# Patient Record
Sex: Female | Born: 1980 | Race: White | Hispanic: No | Smoking: Never smoker
Health system: Southern US, Community
[De-identification: ages and names within clinical notes are randomized; demographics above are authoritative.]

## PROBLEM LIST (undated history)

## (undated) DIAGNOSIS — F329 Major depressive disorder, single episode, unspecified: Secondary | ICD-10-CM

## (undated) DIAGNOSIS — K56609 Unspecified intestinal obstruction, unspecified as to partial versus complete obstruction: Secondary | ICD-10-CM

## (undated) DIAGNOSIS — F909 Attention-deficit hyperactivity disorder, unspecified type: Secondary | ICD-10-CM

## (undated) DIAGNOSIS — K449 Diaphragmatic hernia without obstruction or gangrene: Secondary | ICD-10-CM

## (undated) DIAGNOSIS — K219 Gastro-esophageal reflux disease without esophagitis: Secondary | ICD-10-CM

## (undated) DIAGNOSIS — K580 Irritable bowel syndrome with diarrhea: Secondary | ICD-10-CM

## (undated) DIAGNOSIS — N200 Calculus of kidney: Secondary | ICD-10-CM

## (undated) DIAGNOSIS — Z8719 Personal history of other diseases of the digestive system: Secondary | ICD-10-CM

## (undated) DIAGNOSIS — D649 Anemia, unspecified: Secondary | ICD-10-CM

## (undated) DIAGNOSIS — Z862 Personal history of diseases of the blood and blood-forming organs and certain disorders involving the immune mechanism: Secondary | ICD-10-CM

## (undated) DIAGNOSIS — K589 Irritable bowel syndrome without diarrhea: Secondary | ICD-10-CM

## (undated) DIAGNOSIS — G4730 Sleep apnea, unspecified: Secondary | ICD-10-CM

## (undated) DIAGNOSIS — F340 Cyclothymic disorder: Secondary | ICD-10-CM

## (undated) DIAGNOSIS — F5104 Psychophysiologic insomnia: Secondary | ICD-10-CM

## (undated) DIAGNOSIS — G4719 Other hypersomnia: Secondary | ICD-10-CM

## (undated) DIAGNOSIS — G44009 Cluster headache syndrome, unspecified, not intractable: Secondary | ICD-10-CM

## (undated) DIAGNOSIS — G43909 Migraine, unspecified, not intractable, without status migrainosus: Secondary | ICD-10-CM

## (undated) DIAGNOSIS — J42 Unspecified chronic bronchitis: Secondary | ICD-10-CM

## (undated) DIAGNOSIS — G4733 Obstructive sleep apnea (adult) (pediatric): Secondary | ICD-10-CM

## (undated) DIAGNOSIS — F419 Anxiety disorder, unspecified: Secondary | ICD-10-CM

## (undated) DIAGNOSIS — T8339XA Other mechanical complication of intrauterine contraceptive device, initial encounter: Secondary | ICD-10-CM

## (undated) DIAGNOSIS — F988 Other specified behavioral and emotional disorders with onset usually occurring in childhood and adolescence: Secondary | ICD-10-CM

## (undated) DIAGNOSIS — E282 Polycystic ovarian syndrome: Secondary | ICD-10-CM

## (undated) DIAGNOSIS — R05 Cough: Secondary | ICD-10-CM

## (undated) DIAGNOSIS — Z8711 Personal history of peptic ulcer disease: Secondary | ICD-10-CM

## (undated) DIAGNOSIS — F32A Depression, unspecified: Secondary | ICD-10-CM

## (undated) DIAGNOSIS — J45909 Unspecified asthma, uncomplicated: Secondary | ICD-10-CM

## (undated) HISTORY — PX: ESOPHAGOGASTRODUODENOSCOPY: SHX1529

## (undated) HISTORY — DX: Polycystic ovarian syndrome: E28.2

## (undated) HISTORY — DX: Anxiety disorder, unspecified: F41.9

## (undated) HISTORY — PX: TYMPANOSTOMY TUBE PLACEMENT: SHX32

## (undated) HISTORY — PX: UPPER GASTROINTESTINAL ENDOSCOPY: SHX188

---
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
## 2009-03-15 ENCOUNTER — Ambulatory Visit: Admission: RE | Admit: 2009-03-15 | Discharge: 2009-03-15 | Payer: Self-pay | Admitting: Internal Medicine

## 2009-07-17 ENCOUNTER — Other Ambulatory Visit: Admission: RE | Admit: 2009-07-17 | Discharge: 2009-07-17 | Payer: Self-pay | Admitting: Obstetrics and Gynecology

## 2010-03-06 ENCOUNTER — Emergency Department (HOSPITAL_BASED_OUTPATIENT_CLINIC_OR_DEPARTMENT_OTHER)
Admission: EM | Admit: 2010-03-06 | Discharge: 2010-03-06 | Disposition: A | Discharge status: Home / Self Care | Payer: Managed Care, Other (non HMO) | Source: Home / Self Care | Attending: Emergency Medicine | Admitting: Emergency Medicine

## 2010-03-06 ENCOUNTER — Emergency Department (HOSPITAL_BASED_OUTPATIENT_CLINIC_OR_DEPARTMENT_OTHER)
Admission: EM | Admit: 2010-03-06 | Discharge: 2010-03-06 | Disposition: A | Discharge status: Other Acute Inpatient Hospital | Payer: Managed Care, Other (non HMO) | Source: Home / Self Care | Attending: Emergency Medicine | Admitting: Emergency Medicine

## 2010-03-06 ENCOUNTER — Emergency Department (INDEPENDENT_AMBULATORY_CARE_PROVIDER_SITE_OTHER): Payer: Managed Care, Other (non HMO)

## 2010-03-06 ENCOUNTER — Inpatient Hospital Stay (HOSPITAL_COMMUNITY)
Admission: EM | Admit: 2010-03-06 | Discharge: 2010-03-09 | DRG: 389 | Disposition: A | Discharge status: Home / Self Care | Payer: Managed Care, Other (non HMO) | Attending: Surgery | Admitting: Surgery

## 2010-03-06 DIAGNOSIS — R112 Nausea with vomiting, unspecified: Secondary | ICD-10-CM | POA: Insufficient documentation

## 2010-03-06 DIAGNOSIS — D72829 Elevated white blood cell count, unspecified: Secondary | ICD-10-CM | POA: Diagnosis present

## 2010-03-06 DIAGNOSIS — R109 Unspecified abdominal pain: Secondary | ICD-10-CM | POA: Insufficient documentation

## 2010-03-06 DIAGNOSIS — K56609 Unspecified intestinal obstruction, unspecified as to partial versus complete obstruction: Secondary | ICD-10-CM

## 2010-03-06 DIAGNOSIS — J45909 Unspecified asthma, uncomplicated: Secondary | ICD-10-CM | POA: Diagnosis present

## 2010-03-06 DIAGNOSIS — K219 Gastro-esophageal reflux disease without esophagitis: Secondary | ICD-10-CM | POA: Diagnosis present

## 2010-03-06 DIAGNOSIS — Z6841 Body Mass Index (BMI) 40.0 and over, adult: Secondary | ICD-10-CM

## 2010-03-06 LAB — CBC
Hemoglobin: 11.2 g/dL — ABNORMAL LOW (ref 12.0–15.0)
MCH: 21.8 pg — ABNORMAL LOW (ref 26.0–34.0)
MCHC: 31.7 g/dL (ref 30.0–36.0)
MCV: 67.7 fL — ABNORMAL LOW (ref 78.0–100.0)
Platelets: 324 10*3/uL (ref 150–400)
Platelets: ADEQUATE 10*3/uL (ref 150–400)
RBC: 5.23 MIL/uL — ABNORMAL HIGH (ref 3.87–5.11)
RDW: 19.2 % — ABNORMAL HIGH (ref 11.5–15.5)
WBC: 12.7 10*3/uL — ABNORMAL HIGH (ref 4.0–10.5)

## 2010-03-06 LAB — URINALYSIS, ROUTINE W REFLEX MICROSCOPIC
Ketones, ur: NEGATIVE mg/dL
Ketones, ur: NEGATIVE mg/dL
Nitrite: NEGATIVE
Nitrite: NEGATIVE
Protein, ur: NEGATIVE mg/dL
Specific Gravity, Urine: 1.03 (ref 1.005–1.030)
Urine Glucose, Fasting: NEGATIVE mg/dL
Urine Glucose, Fasting: NEGATIVE mg/dL
Urobilinogen, UA: 0.2 mg/dL (ref 0.0–1.0)
pH: 5.5 (ref 5.0–8.0)

## 2010-03-06 LAB — COMPREHENSIVE METABOLIC PANEL
ALT: 34 U/L (ref 0–35)
AST: 25 U/L (ref 0–37)
Albumin: 4.9 g/dL (ref 3.5–5.2)
BUN: 18 mg/dL (ref 6–23)
CO2: 22 mEq/L (ref 19–32)
Calcium: 9.2 mg/dL (ref 8.4–10.5)
Chloride: 103 mEq/L (ref 96–112)
Creatinine, Ser: 0.8 mg/dL (ref 0.4–1.2)
Creatinine, Ser: 0.6 mg/dL (ref 0.4–1.2)
GFR calc Af Amer: >60 mL/min (ref >60)
GFR calc Af Amer: >60 mL/min (ref >60)
GFR calc non Af Amer: >60 mL/min (ref >60)
Glucose, Bld: 108 mg/dL — ABNORMAL HIGH (ref 70–99)
Sodium: 142 mEq/L (ref 135–145)
Total Bilirubin: 0.7 mg/dL (ref 0.3–1.2)
Total Protein: 8.8 g/dL — ABNORMAL HIGH (ref 6.0–8.3)

## 2010-03-06 LAB — WET PREP, GENITAL: Clue Cells Wet Prep HPF POC: NONE SEEN

## 2010-03-06 LAB — DIFFERENTIAL
Basophils Absolute: 0 10*3/uL (ref 0.0–0.1)
Basophils Absolute: 0 10*3/uL (ref 0.0–0.1)
Eosinophils Absolute: 0.1 10*3/uL (ref 0.0–0.7)
Eosinophils Absolute: 0.1 10*3/uL (ref 0.0–0.7)
Lymphocytes Relative: 20 % (ref 12–46)
Lymphocytes Relative: 11 % — ABNORMAL LOW (ref 12–46)
Lymphs Abs: 2.5 10*3/uL (ref 0.7–4.0)
Lymphs Abs: 1.3 10*3/uL (ref 0.7–4.0)
Monocytes Relative: 5 % (ref 3–12)
Monocytes Relative: 3 % (ref 3–12)
Neutro Abs: 9.5 10*3/uL — ABNORMAL HIGH (ref 1.7–7.7)

## 2010-03-06 LAB — MAGNESIUM: Magnesium: 2.1 mg/dL (ref 1.5–2.5)

## 2010-03-06 LAB — LIPASE, BLOOD
Lipase: 38 U/L (ref 23–300)
Lipase: 20 U/L (ref 11–59)

## 2010-03-06 LAB — PREGNANCY, URINE: Preg Test, Ur: NEGATIVE

## 2010-03-06 LAB — URINE MICROSCOPIC-ADD ON

## 2010-03-06 LAB — AMYLASE: Amylase: 32 U/L (ref 0–105)

## 2010-03-06 MED ORDER — IOHEXOL 300 MG/ML  SOLN
100.0000 mL | Freq: Once | INTRAMUSCULAR | Status: AC | PRN
  Administered 2010-03-06: 100 mL via INTRAVENOUS

## 2010-03-07 ENCOUNTER — Inpatient Hospital Stay (HOSPITAL_COMMUNITY): Payer: Managed Care, Other (non HMO)

## 2010-03-07 LAB — CBC
MCH: 21.8 pg — ABNORMAL LOW (ref 26.0–34.0)
MCHC: 30.6 g/dL (ref 30.0–36.0)
MCV: 71.1 fL — ABNORMAL LOW (ref 78.0–100.0)
Platelets: 242 10*3/uL (ref 150–400)
RBC: 4.64 MIL/uL (ref 3.87–5.11)

## 2010-03-07 LAB — DIFFERENTIAL
Basophils Relative: 0 % (ref 0–1)
Eosinophils Absolute: 0.1 10*3/uL (ref 0.0–0.7)
Eosinophils Relative: 1 % (ref 0–5)
Lymphs Abs: 2.1 10*3/uL (ref 0.7–4.0)
Monocytes Absolute: 0.4 10*3/uL (ref 0.1–1.0)
Neutro Abs: 5.2 10*3/uL (ref 1.7–7.7)

## 2010-03-07 LAB — BASIC METABOLIC PANEL
BUN: 6 mg/dL (ref 6–23)
CO2: 25 mEq/L (ref 19–32)
Calcium: 8.3 mg/dL — ABNORMAL LOW (ref 8.4–10.5)
Chloride: 106 mEq/L (ref 96–112)
Creatinine, Ser: 0.62 mg/dL (ref 0.4–1.2)
GFR calc Af Amer: >60 mL/min (ref >60)

## 2010-03-07 LAB — GC/CHLAMYDIA PROBE AMP, GENITAL
Chlamydia, DNA Probe: NEGATIVE
GC Probe Amp, Genital: NEGATIVE

## 2010-03-07 LAB — TSH: TSH: 3.654 u[IU]/mL (ref 0.350–4.500)

## 2010-03-08 ENCOUNTER — Inpatient Hospital Stay (HOSPITAL_COMMUNITY): Payer: Managed Care, Other (non HMO)

## 2010-03-09 ENCOUNTER — Inpatient Hospital Stay (HOSPITAL_COMMUNITY): Payer: Managed Care, Other (non HMO)

## 2010-03-09 LAB — CBC
HCT: 32.9 % — ABNORMAL LOW (ref 36.0–46.0)
MCH: 21.4 pg — ABNORMAL LOW (ref 26.0–34.0)
MCV: 70.3 fL — ABNORMAL LOW (ref 78.0–100.0)
Platelets: 256 10*3/uL (ref 150–400)
RBC: 4.68 MIL/uL (ref 3.87–5.11)

## 2010-03-09 LAB — BASIC METABOLIC PANEL
BUN: 5 mg/dL — ABNORMAL LOW (ref 6–23)
Chloride: 104 mEq/L (ref 96–112)
Creatinine, Ser: 0.7 mg/dL (ref 0.4–1.2)
Glucose, Bld: 84 mg/dL (ref 70–99)

## 2010-03-11 NOTE — H&P (Signed)
NAMESHELBIE, FRANKEN          ACCOUNT NO.:  [id number]  MEDICAL RECORD NO.:  [id number]           PATIENT TYPE:  E  LOCATION:  [facility]                         FACILITY:  [facility]  PHYSICIAN:  Ardeth Sportsman, MD     DATE OF BIRTH:  May 07, 1980  DATE OF ADMISSION:  03/06/2010 DATE OF DISCHARGE:                             HISTORY & PHYSICAL   REFERRING PHYSICIAN:  Dr. Randa Evens at [facility] ER.  PRIMARY CARE PHYSICIAN:  Dr. Deirdre Peer. Polite, MD.  NURSE PRACTITIONER:  Lorenda Cahill.  CHIEF COMPLAINT:  Abdominal pain, nausea, vomiting.  BRIEF HISTORY:  The patient is a 30 year old female, who presented early this morning at [facility] ER with 5 hours of nausea and vomiting, abdominal discomfort, which started as cramps.  She initially thought she was hungry and ate some more an hour after her first meal.  This did not make her feel better, became progressively worse.  She was seen and diagnosed with flu-like symptoms and sent home on Phenergan and Zofran. She was also given pain medications, which she cannot recall.  After going home and taking the medicine, she had ongoing nausea, vomiting, along with abdominal pain, which was in the right upper quadrant, both the right and left lower quadrants.  She returned to the ER around 11 o'clock today, was treated with morphine and Zofran on multiple occasions.  Workup included a pelvic, which was reported normal.  Wet prep was negative.  Urine micro was negative.  White count was 11.9, hemoglobin 11.2, platelets 35,000.  Sodium is 142, potassium is 4.6, chloride is 109, CO2 is 22, BUN is 13, creatinine is 0.6, glucose is 108.  Lipase is 38, total bilirubin 0.7, alk phos 112, SGPT 23, SGOT 34. Pregnancy was negative.  Workup earlier at 1:30 in the a.m. showed a white count of 12.7, hemoglobin of 11, hematocrit 35, platelets 324,000. The patient then underwent CT of the abdomen, small-bowel dilatation with fluid-filled mid abdominal  small-bowel loops noted.  The proximal and distal small bowel was decompressed.  There was concern with loop obstruction.  The patient was referred to [facility] for evaluation and admission.  The patient continues to have abdominal discomfort and her nausea has now resolved.  She has an NG in place. She has no history of abdominal pain prior to this.  PAST MEDICAL HISTORY: 1. Sinusitis, on antibiotics last week.  She had a urinary tract     infection about 5 years ago. 2. History of asthma. 3. History of depression, since her pregnancy. 4. GERD. 5. BMI of 55.8.  Height is 64 inches, weight is 325 pounds.  PAST SURGICAL HISTORY:  She had a C-section 2 years ago during that her first only pregnancy.  She had hyperemesis and 2 episodes of nephrolithiasis requiring hospitalization and treatment.  FAMILY HISTORY:  Both parents are living in good health.  Her mom has had a bypass.  She has one brother 3 in good health.  She has 3 sisters all living in good health.  They have some problems with depression, ADHD, and asthma.  SOCIAL HISTORY:  The patient is married,  lives with her husband.  She works at [employer].  Tobacco:  Never.  Alcohol:  Rare. Drugs:  None.  REVIEW OF SYSTEMS:  Fever:  None.  Skin changes:  None.  Weight:  No changes.  Psych:  No new changes.  Cerebrovascular:  No headaches, dizziness, syncope, presyncope, stroke, or seizure.  Pulmonary: Positive for being tired and lethargic all day.  No orthopnea or PND. She does have dyspnea on exertion.  She wakes up with reflux positive for asthma, positive for cough, which is clear now, but green last week before she started antibiotics.  Cardiac:  No history of chest pain or palpitations.  GI:  Positive for GERD.  Positive for nausea and vomiting, starting yesterday, but none prior to that.  No diarrhea or constipation.  No blood in her stool.  GU is negative.  Her last urinary tract infection  was approximately 5 years ago.  Lower Extremities:  No edema.  No claudication.  Musculoskeletal:  No arthritis.  No joint problems.  CURRENT MEDICATIONS: 1. Zoloft 200 mg daily. 2. Albuterol p.r.n. and 2 other medicines p.r.n. She does not know     what they are.  ALLERGIES:  None known.  PHYSICAL EXAM:  GENERAL:  This is a well-nourished, well-developed, markedly overweight, white female, in mild discomfort. VITAL SIGNS:  Her temperature at 11 a.m. was 97, heart rate was 63, blood pressure is 110/71.  She complained of 5/10 pain.  Currently, temperature is 98.2, heart rate is 69, blood pressure 107/63, respiratory rate is 20, sats are 94% on room air. HEAD:  Normocephalic. EARS:  Normal hearing. NOSE, THROAT, AND MOUTH:  Normal mucosa.  Dentition appears normal. Trachea is midline.  Thyroid is nonpalpable.  There is no JVD.  No palpable thyromegaly.  No bruits. RESPIRATORY:  Effort normal. CHEST:  Clear to auscultation and percussion.  Nontender to palpation. CARDIAC:  Normal S1 and S2.  No murmurs or rubs were heard.  Pulses are +2 and equal bilaterally. ABDOMEN:  She is not distended, but she is tender to palpation.  Bowel sounds are hypoactive.  Tenderness appears to be in the right upper quadrant, right lower and left lower quadrants. GU/RECTAL:  Deferred. LYMPHADENOPATHY:  No palpable lymphadenopathy in neck, groins. MUSCULOSKELETAL:  No abnormalities noted. SKIN:  No changes. NEUROLOGIC:  Normal affect.  Cranial nerves are intact. PSYCH:  Normal affect.  IMPRESSION: 1. Small-bowel obstruction. 2. Recent sinusitis, on antibiotics. 3. Asthma. 4. Depression. 5. BMI of 55.8. 6. Gastroesophageal reflux disease. 7. Leukocytosis.  PLAN:  We will hydrate her, place her on bowel rest, keep her n.p.o. with further treatment and evaluation as needed.     Eber Hong, P.A.   ______________________________ Ardeth Sportsman, MD    WDJ/MEDQ  D:  03/06/2010   T:  03/06/2010  Job:  [id number]  cc:   Deirdre Peer. Polite, M.D.  Electronically Signed by Sherrie George P.A. on 03/07/2010 06:24:38 PM Electronically Signed by Karie Soda MD on 03/11/2010 08:47:54 AM

## 2010-03-18 NOTE — Discharge Summary (Signed)
[name], Claudia Walsh          ACCOUNT NO.:  [id number]  MEDICAL RECORD NO.:  [id number]           PATIENT TYPE:  I  LOCATION:  5153                         FACILITY:  [facility]  PHYSICIAN:  Currie Paris, M.D.DATE OF BIRTH:  04/21/1980  DATE OF ADMISSION:  03/06/2010 DATE OF DISCHARGE:  03/09/2010                              DISCHARGE SUMMARY   ADMITTING PHYSICIAN:  Ardeth Sportsman, MD  PROCEDURES:  None.  CONSULTANTS:  None.  PRIMARY CARE PHYSICIAN:  Deirdre Peer. Polite, MD.  REASON FOR ADMISSION:  Claudia Walsh is a 30 year old female, who presented to the emergency department initially early on the morning of admission with a history of 5 hours of nausea, vomiting, and abdominal discomfort.  She was diagnosed with flu-like symptoms and sent home with Phenergan and Zofran for nausea.  After taking the medicines, they did not seem to help and she had persistent nausea, vomiting, and abdominal pain.  She returned to the emergency department where her further workup was completed.  Her white blood cell count was 11,900, and a CT scan was performed, which revealed small bowel dilatation with fluid filled mid abdominal small bowel loops consistent with obstruction.  Please see admitting history and physical for further diagnoses.  ADMITTING DIAGNOSES: 1. Small bowel obstruction. 2. Asthma. 3. History of depression. 4. Morbid obesity.  HOSPITAL COURSE:  At this time, the patient was admitted.  She was made n.p.o. and an NG tube was placed.  IV fluids were started.  The following day, the patient had abdominal films, which actually showed a resolution of a bowel obstruction.  However, the patient was not passing flatus, and had not had the bowel movement.  She was still requiring IV pain medicine for her abdominal pain.  At this time, the patient's NG tube was left in place.  On hospital day #2, the patient's abdomen was feeling much better.  She was passing flatus, but  had not had a bowel movement.  Her NG tube was clamped for 6 hours and with minimal residual was later discontinued.  She was placed on clear liquids at this time. She was also given a suppository, which then induced several bowel movements.  On hospital day #3, the patient was tolerating clear liquids without any complaints.  She was having bowel movements and flatus without nausea or vomiting or abdominal pain.  Her abdomen was soft, nontender, nondistended with active bowel sounds and otherwise morbidly obese.  At this time, her diet was advanced to a regular diet and as tolerated will be able to be discharged home.  DISCHARGE DIAGNOSES: 1. Small bowel obstruction, resolved. 2. Morbid obesity. 3. Asthma. 4. Depression.  DISCHARGE MEDICATIONS:  Please see medication reconciliation form.  DISCHARGE INSTRUCTIONS:  The patient has no activity restrictions or dietary restrictions.  She is informed to follow up with her primary care physician as needed.     Letha Cape, PA   ______________________________ Currie Paris, M.D.    KEO/MEDQ  D:  03/09/2010  T:  03/09/2010  Job:  [id number]  cc:   Deirdre Peer. Polite, M.D.  Electronically Signed by Barnetta Chapel PA on 03/15/2010  07:07:54 AM Electronically Signed by Cyndia Bent M.D. on 03/18/2010 09:06:17 AM

## 2010-05-22 ENCOUNTER — Inpatient Hospital Stay (HOSPITAL_COMMUNITY)
Admission: AD | Admit: 2010-05-22 | Discharge: 2010-05-27 | DRG: 885 | Disposition: A | Discharge status: Home / Self Care | Payer: Managed Care, Other (non HMO) | Source: Ambulatory Visit | Attending: Psychiatry | Admitting: Psychiatry

## 2010-05-22 ENCOUNTER — Emergency Department (HOSPITAL_COMMUNITY)
Admission: EM | Admit: 2010-05-22 | Discharge: 2010-05-22 | Disposition: A | Discharge status: Other Acute Inpatient Hospital | Payer: Managed Care, Other (non HMO) | Attending: Emergency Medicine | Admitting: Emergency Medicine

## 2010-05-22 DIAGNOSIS — N390 Urinary tract infection, site not specified: Secondary | ICD-10-CM

## 2010-05-22 DIAGNOSIS — Z79899 Other long term (current) drug therapy: Secondary | ICD-10-CM | POA: Insufficient documentation

## 2010-05-22 DIAGNOSIS — R51 Headache: Secondary | ICD-10-CM | POA: Insufficient documentation

## 2010-05-22 DIAGNOSIS — F329 Major depressive disorder, single episode, unspecified: Secondary | ICD-10-CM | POA: Insufficient documentation

## 2010-05-22 DIAGNOSIS — F411 Generalized anxiety disorder: Secondary | ICD-10-CM

## 2010-05-22 DIAGNOSIS — Z818 Family history of other mental and behavioral disorders: Secondary | ICD-10-CM

## 2010-05-22 DIAGNOSIS — R45851 Suicidal ideations: Secondary | ICD-10-CM

## 2010-05-22 DIAGNOSIS — J45909 Unspecified asthma, uncomplicated: Secondary | ICD-10-CM

## 2010-05-22 DIAGNOSIS — F3289 Other specified depressive episodes: Secondary | ICD-10-CM | POA: Insufficient documentation

## 2010-05-22 DIAGNOSIS — F332 Major depressive disorder, recurrent severe without psychotic features: Principal | ICD-10-CM

## 2010-05-22 LAB — DIFFERENTIAL
Basophils Relative: 0 % (ref 0–1)
Eosinophils Absolute: 0.1 10*3/uL (ref 0.0–0.7)
Eosinophils Relative: 1 % (ref 0–5)
Lymphs Abs: 2.4 10*3/uL (ref 0.7–4.0)
Monocytes Absolute: 0.5 10*3/uL (ref 0.1–1.0)
Monocytes Relative: 5 % (ref 3–12)
Neutrophils Relative %: 73 % (ref 43–77)

## 2010-05-22 LAB — BASIC METABOLIC PANEL
BUN: 12 mg/dL (ref 6–23)
CO2: 28 mEq/L (ref 19–32)
Calcium: 9.8 mg/dL (ref 8.4–10.5)
GFR calc non Af Amer: >60 mL/min (ref >60)
Glucose, Bld: 87 mg/dL (ref 70–99)

## 2010-05-22 LAB — URINE MICROSCOPIC-ADD ON

## 2010-05-22 LAB — URINALYSIS, ROUTINE W REFLEX MICROSCOPIC
Bilirubin Urine: NEGATIVE
Ketones, ur: NEGATIVE mg/dL
Nitrite: NEGATIVE
Specific Gravity, Urine: 1.022 (ref 1.005–1.030)
Urobilinogen, UA: 0.2 mg/dL (ref 0.0–1.0)

## 2010-05-22 LAB — POCT PREGNANCY, URINE: Preg Test, Ur: NEGATIVE

## 2010-05-22 LAB — CBC
MCH: 22.5 pg — ABNORMAL LOW (ref 26.0–34.0)
MCHC: 31.6 g/dL (ref 30.0–36.0)
MCV: 71.3 fL — ABNORMAL LOW (ref 78.0–100.0)
Platelets: 355 10*3/uL (ref 150–400)
RDW: 18.4 % — ABNORMAL HIGH (ref 11.5–15.5)

## 2010-05-22 LAB — RAPID URINE DRUG SCREEN, HOSP PERFORMED
Amphetamines: NOT DETECTED
Barbiturates: NOT DETECTED
Benzodiazepines: NOT DETECTED
Opiates: NOT DETECTED

## 2010-05-23 DIAGNOSIS — F332 Major depressive disorder, recurrent severe without psychotic features: Secondary | ICD-10-CM

## 2010-05-24 LAB — T4, FREE: Free T4: 1.02 ng/dL (ref 0.80–1.80)

## 2010-05-24 LAB — T3, FREE: T3, Free: 2.8 pg/mL (ref 2.3–4.2)

## 2010-05-24 NOTE — H&P (Signed)
Claudia Walsh, Claudia Walsh NO.:  [id number]  MEDICAL RECORD NO.:  [id number]           PATIENT TYPE:  I  LOCATION:  0508                          [facility]ACILITY:  [facility]  PHYSICIAN:  Franchot Gallo, MD     DATE OF BIRTH:  1980/05/17  DATE OF ADMISSION:  05/22/2010 DATE OF DISCHARGE:                      PSYCHIATRIC ADMISSION ASSESSMENT   CHIEF COMPLAINT:  "I began to have problems when my doctor switched me to Cymbalta."  HISTORY OF PRESENT ILLNESS:  Claudia Walsh is a 30 year old married white female who presents to Behavioral Health for evaluation of increasing depression and suicidal thoughts with various plans.  The patient states that she has a long history of depression, but was doing reasonably well until approximately 2 weeks ago when her mental health provider switched her antidepressant from Zoloft to Cymbalta.  Since that time, the patient states that her depressive symptoms have increasingly worsened with "racing thoughts and conversations in my head involving a female voice."  The patient also states that she began to experience suicidal ideation with several possible plans, but with no current intent.  Currently, the patient states that she is having significant difficulty initiating and maintaining sleep.  She reports a decreased appetite as well as severe feelings of sadness, anhedonia and depressed mood.  As stated above, the patient reports suicidal ideations, but states that she can contract for safety while in the hospital.  She reports one past suicide gesture of "holding a gun to my head" at the age of 87.  The patient also reports anxiety symptoms throughout the day.  She denies any panic episodes.  Other than the above mentioned "female voice," the patient denies any history of auditory or visual hallucinations, or delusional thinking.  She also denies any history of prolonged manic or hypomanic symptoms.  The patient denies any past or current  abuse of tobacco products, alcohol or illicit drugs.  She presents for evaluation and treatment of the above symptoms.  PAST PSYCHIATRIC HISTORY:  The patient denies any past psychiatric hospitalizations.  She does report past trials of  antidepressants Wellbutrin, Paxil and Zoloft, but did not find them to be of benefit.  CURRENT MEDICATIONS: 1. Cymbalta 60 mg p.o. q.a.m. prior to admission. 2. Xanax 0.5 to 1 mg p.r.n. q.8 h. p.r.n. for anxiety.  ALLERGIES:  NKDA.  PAST SURGICAL HISTORY:  Cesarean section approximately 2 years ago.  FAMILY HISTORY:  The patient states that her mother is depressed and she describes a maternal grandmother as having a bipolar illness.  She denies any other family history of psychiatric or substance abuse related illnesses.  SOCIAL HISTORY:  The patient states that she was born in [place] and has lived in [place] for the past 11 years.  She is married and currently lives with her husband, 71-year-old daughter and her in-laws. This is her first marriage and she has been married for 3-1/2 years. She has one child, a daughter 1 years of age who is in good health.  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  As stated in the HPI, she denies any use of tobacco products, alcohol or illicit drugs.  MENTAL STATUS EXAM:  GENERAL:  The patient was alert and oriented x3. She was cooperative throughout the evaluation, but moderately anxious. Speech was appropriate in terms of rate and volume.  Mood appeared moderate to severely depressed.  Affect was moderately constricted and anxious.  Thoughts, the patient reported suicidal ideations with plans prior to admission and was able to contract for safety now that she is in the hospital.  She denies any homicidal ideations.  She denies any obvious auditory or visual  hallucinations or delusional thinking, but describes "racing thoughts."  She also describes "conversations going on in my head  involving a female voice."  Judgment and insight both appeared fair.  IMPRESSION:     AXIS I:   Major depressive disorder, recurrent, moderate to severe.      Generalized anxiety disorder, moderate.  AXIS II:   Deferred.  AXIS III:   Asthma.  History of cesarean section 2 years ago.  AXIS IV:   Recent interaction with antidepressants.  Long history of    depressive symptoms.  Issues with Primary Support System.  AXIS V:   Global assessment of functioning at time of admission    approximately 35.  Highest global assessment of functioning in the past    year approximately 65.  PLAN: 1. The patient was started on the medication venlafaxine XR at 75 mg     p.o. q.a.m. to address her depressive symptoms. 2. The patient was not restarted on the medication Cymbalta. 3. The patient was started on the medication Xanax at 0.5 mg p.o. q.8     h. p.r.n. as needed for anxiety. 4. The patient was started on the medication trazodone 50 mg p.o.     nightly to address her sleep difficulties. 5. The patient was started on Cipro 250 mg p.o. b.i.d. x3 days to     address possible urinary tract infection. 6. The patient will continue to be monitored for dangerous to self     and/or others. 7. The patient will participate in unit activities per routine.    ___________________________________ Franchot Gallo, MD     RR/MEDQ  D:  05/23/2010  T:  05/24/2010  Job:  [id number]  Electronically Signed by Franchot Gallo MD on 05/24/2010 04:36:21 PM

## 2010-05-28 NOTE — Discharge Summary (Signed)
NAMEKELSHA, OLDER          ACCOUNT NO.:  [id number]  MEDICAL RECORD NO.:  [id number]           PATIENT TYPE:  I  LOCATION:  3086                          [facility]ACILITY:  [facility]  PHYSICIAN:  Franchot Gallo, MD     DATE OF BIRTH:  28-Dec-1980  DATE OF ADMISSION:  05/22/2010 DATE OF DISCHARGE:  05/27/2010                              DISCHARGE SUMMARY   REASON FOR ADMISSION:  The patient was having problems when she was switched to Cymbalta.  She was having increasing depression and suicidal thoughts with various plans.  She was reporting problems with her appetite, severe feelings of sadness, anhedonia and depressed mood.  FINAL IMPRESSION:  Axis I:  Major depressive disorder, recurrent, moderate to severe.  Generalized anxiety disorder, moderate. Axis II:  Deferred. Axis III:  History of asthma, history of cesarean section 2 years ago. Axis IV:  Recent interaction on antidepressants, a long history of depressive symptoms. Axis V:  Global Assessment of Functioning 55-60 with highest global of this past year being 70.  SIGNIFICANT LABS:  Urine drug screen is negative.  White count mildly elevated at 11.5.  Alcohol level less than 11.  BMET within normal limits.  Urine pregnancy test is negative.  SIGNIFICANT FINDINGS:  This is a 30 year old female admitted to the adult milieu.  She was alert, oriented x3, cooperative, but moderately anxious.  Her speech was appropriate in terms of rate and volume.  Mood appeared moderately to severely depressed.  She was able to contract for safety when she was in hospital and denied any homicidal ideation.  We started the patient on Effexor XR 75 mg to address her depressive symptoms.  We discontinued her Cymbalta and the patient was started on Xanax every 8 hours 0.5 mg as needed for anxiety, trazodone for sleep and Cipro b.i.d. for 3 days to address a possible urinary tract infection.  We continued to monitor her mood and affect.  The  patient was attending groups, endorsing symptoms of depression and suicidal thoughts.  She felt her medications were making her angry, reporting that she became suicidal on Cymbalta and then was switched to Effexor. She had some symptoms of acid reflux and was started on omeprazole, which was beneficial.  The patient was improving, she felt ready for discharge.  We contacted the patient's husband for a family session to gather collateral information, to assess safety concerns and provide suicide prevention.  Her sleep was improving.  Her appetite was good.  She was having moderate depressive symptoms.  She denied any psychotic symptoms. We added Neurontin routinely for her symptoms of anxiety.  On the day of discharge, the patient reported having good sleep, good appetite, her depressive symptoms were good.  She denied any suicidal or homicidal ideation and denied any auditory visualizations or delusional thinking. Her anxiety was under good control.  She had a family meeting this weekend that went well and we felt the patient was stable for discharge.  DISCHARGE MEDICATIONS: 1. Xanax 0.5 mg q.8 h., p.r.n. for anxiety. 2. Gabapentin 300 mg 1 t.i.d. 3. Trazodone 50 mg 1 nightly. 4. Effexor 150 mg daily. 5. The patient was  to stop taking her Cymbalta.  FOLLOW-UP APPOINTMENT:  Gweneth Dimitri at [facility] on May 29, 2010, at 8 a.m., phone number [phone], and Clovis Riley of [facility] on Thursday, May 30, 2010, at 1:15, phone number [phone].     Landry Corporal, N.P.   ______________________________ Franchot Gallo, MD    JO/MEDQ  D:  05/27/2010  T:  05/28/2010  Job:  [id number]  Electronically Signed by Limmie PatriciaP. on 05/28/2010 09:27:54 AM Electronically Signed by Franchot Gallo MD on 05/28/2010 04:23:47 PM

## 2010-08-01 ENCOUNTER — Encounter: Payer: Self-pay | Admitting: *Deleted

## 2010-08-01 ENCOUNTER — Emergency Department (HOSPITAL_BASED_OUTPATIENT_CLINIC_OR_DEPARTMENT_OTHER)
Admission: EM | Admit: 2010-08-01 | Discharge: 2010-08-01 | Discharge status: Against Medical Advice | Payer: Managed Care, Other (non HMO) | Attending: Emergency Medicine | Admitting: Emergency Medicine

## 2010-08-01 ENCOUNTER — Emergency Department (INDEPENDENT_AMBULATORY_CARE_PROVIDER_SITE_OTHER): Payer: Managed Care, Other (non HMO)

## 2010-08-01 ENCOUNTER — Emergency Department (HOSPITAL_BASED_OUTPATIENT_CLINIC_OR_DEPARTMENT_OTHER): Payer: Managed Care, Other (non HMO)

## 2010-08-01 DIAGNOSIS — R9409 Abnormal results of other function studies of central nervous system: Secondary | ICD-10-CM

## 2010-08-01 DIAGNOSIS — R4789 Other speech disturbances: Secondary | ICD-10-CM | POA: Insufficient documentation

## 2010-08-01 DIAGNOSIS — R479 Unspecified speech disturbances: Secondary | ICD-10-CM

## 2010-08-01 DIAGNOSIS — F411 Generalized anxiety disorder: Secondary | ICD-10-CM | POA: Insufficient documentation

## 2010-08-01 HISTORY — DX: Depression, unspecified: F32.A

## 2010-08-01 HISTORY — DX: Major depressive disorder, single episode, unspecified: F32.9

## 2010-08-01 LAB — DIFFERENTIAL
Basophils Absolute: 0 10*3/uL (ref 0.0–0.1)
Basophils Relative: 0 % (ref 0–1)
Eosinophils Relative: 1 % (ref 0–5)
Monocytes Absolute: 0.4 10*3/uL (ref 0.1–1.0)
Monocytes Relative: 6 % (ref 3–12)

## 2010-08-01 LAB — COMPREHENSIVE METABOLIC PANEL
AST: 18 U/L (ref 0–37)
Albumin: 4.1 g/dL (ref 3.5–5.2)
BUN: 14 mg/dL (ref 6–23)
Calcium: 9.4 mg/dL (ref 8.4–10.5)
Chloride: 101 mEq/L (ref 96–112)
Creatinine, Ser: 0.7 mg/dL (ref 0.50–1.10)
GFR calc non Af Amer: >60 mL/min (ref >60)
Total Bilirubin: 0.2 mg/dL — ABNORMAL LOW (ref 0.3–1.2)

## 2010-08-01 LAB — RAPID URINE DRUG SCREEN, HOSP PERFORMED
Barbiturates: NOT DETECTED
Cocaine: NOT DETECTED
Opiates: NOT DETECTED
Tetrahydrocannabinol: NOT DETECTED

## 2010-08-01 LAB — CK TOTAL AND CKMB (NOT AT ARMC)
CK, MB: 1.2 ng/mL (ref 0.3–4.0)
Relative Index: INVALID (ref 0.0–2.5)
Total CK: 63 U/L (ref 7–177)

## 2010-08-01 LAB — PROTIME-INR: INR: 1.05 (ref 0.00–1.49)

## 2010-08-01 LAB — APTT: aPTT: 37 seconds (ref 24–37)

## 2010-08-01 LAB — CBC
HCT: 35.9 % — ABNORMAL LOW (ref 36.0–46.0)
Hemoglobin: 11.5 g/dL — ABNORMAL LOW (ref 12.0–15.0)
MCH: 23.4 pg — ABNORMAL LOW (ref 26.0–34.0)
MCHC: 32 g/dL (ref 30.0–36.0)
MCV: 73.1 fL — ABNORMAL LOW (ref 78.0–100.0)
RDW: 18 % — ABNORMAL HIGH (ref 11.5–15.5)

## 2010-08-01 NOTE — ED Notes (Signed)
Pt reports at work and had sudden onset of stuttering speech. Pt denies pain. States unable to walk now since arrival to ED. Grips equal bilaterally, no arm drift, no facial droop.

## 2010-08-01 NOTE — ED Notes (Signed)
Notified dr. Fredricka Bonine that the patient wishes to be d/c home at this time instead of admission

## 2010-08-01 NOTE — ED Provider Notes (Signed)
History     Chief Complaint  Patient presents with  . Allergic Reaction   Patient is a 30 y.o. female presenting with neurologic complaint. The history is provided by the patient.  Neurologic Problem The primary symptoms include speech change. Primary symptoms do not include headaches, syncope, loss of consciousness, altered mental status, seizures, dizziness, visual change, paresthesias, focal weakness, loss of sensation, memory loss, fever, nausea or vomiting. Episode onset: four hours ago. The episode lasted 4 hours. The symptoms are unchanged.  Additional symptoms include anxiety. Additional symptoms do not include neck stiffness, weakness, pain, lower back pain, leg pain, loss of balance, photophobia, aura, hallucinations, nystagmus, taste disturbance, hyperacusis, hearing loss, tinnitus, vertigo, irritability or dysphoric mood. Medical issues do not include seizures, cerebral vascular accident or drug use. Associated medical issues comments: depression, anxiety. Workup history does not include MRI or CT scan.   the patient presents with acute onset of stuttering of speech that occurred while she was at work today, 4 hours ago. The problem has persisted subsequently and the patient is still having difficulty talking due to stuttering speech. She reports a similar phenomenon occurred several months ago after running out of Wellbutrin. The patient denies running out of any of her medications at this time, however reports that she is concerned several knee medications recently. She reports no other neurologic deficits at this time and denies headache or change in vision.  Past Medical History  Diagnosis Date  . Depression     History reviewed. No pertinent past surgical history.  History reviewed. No pertinent family history.  History  Substance Use Topics  . Smoking status: Never Smoker   . Smokeless tobacco: Not on file  . Alcohol Use: No    OB History    Grav Para Term Preterm  Abortions TAB SAB Ect Mult Living                  Review of Systems  Constitutional: Negative for fever and irritability.  HENT: Negative for hearing loss, neck pain, neck stiffness and tinnitus.   Eyes: Negative.  Negative for photophobia.  Respiratory: Negative.   Cardiovascular: Negative.  Negative for syncope.  Gastrointestinal: Negative.  Negative for nausea and vomiting.  Genitourinary: Negative.   Musculoskeletal: Negative.   Skin: Negative.   Neurological: Positive for tremors, speech change and speech difficulty. Negative for dizziness, vertigo, focal weakness, seizures, loss of consciousness, syncope, facial asymmetry, weakness, light-headedness, numbness, headaches, paresthesias and loss of balance.  Psychiatric/Behavioral: Negative for hallucinations, memory loss, confusion, dysphoric mood, agitation and altered mental status.    Physical Exam  BP 110/39  Pulse 76  Temp(Src) 98.4 F (36.9 C) (Oral)  Resp 16  SpO2 97%  Physical Exam  Constitutional: She is oriented to person, place, and time. She appears well-developed and well-nourished.  HENT:  Head: Normocephalic and atraumatic.  Eyes: Conjunctivae and EOM are normal. Pupils are equal, round, and reactive to light.  Neck: Normal range of motion. Neck supple.  Cardiovascular: Normal rate, regular rhythm, normal heart sounds and intact distal pulses.  Exam reveals no gallop and no friction rub.   No murmur heard. Pulmonary/Chest: Effort normal and breath sounds normal. No respiratory distress. She has no wheezes. She has no rales. She exhibits no tenderness.  Abdominal: Soft. Bowel sounds are normal. There is no tenderness.  Musculoskeletal: Normal range of motion. She exhibits no edema and no tenderness.  Neurological: She is alert and oriented to person, place, and time. She has  normal strength and normal reflexes. She is not disoriented. She displays tremor. She displays normal reflexes. No cranial nerve deficit  or sensory deficit. She exhibits normal muscle tone. She displays no seizure activity. Coordination and gait normal. GCS eye subscore is 4. GCS verbal subscore is 5. GCS motor subscore is 6.       The NIHSS stroke scale was performed by me in proper sequence, and the patient receives a score of 1 for dysarthria only due to stuttering of speech.  Skin: Skin is warm and dry. No rash noted. No erythema. No pallor.  Psychiatric: Her behavior is normal. Judgment and thought content normal. Her mood appears anxious. Cognition and memory are normal.       anxious    ED Course  Procedures 5 return to the patient's room to discuss the radiologic and laboratory findings which were normal, as well as to inform her that further testing is needed to assure that there has been no small stroke responsible for her symptoms today. This testing would include admission to the hospital an MRI of the brain. I informed her that my concern was she may have had a small stroke, and if this is the case that further testing would be needed to find out why the stroke occurred as well as the extent of the stroke, and any rehabilitation needed for the speech deficits. The patient tells me that she thinks the problem is psychiatric and or 2 to her medications, and would like to go home. I informed the patient of the risks of going home without further testing which would include larger stroke and permanent neurologic deficits and explained to her again the benefits of hospitalization to exclude this diagnosis or if it is found that she's had stroke to get her further appropriate care. At this time the patient resides back to me my concerns for her as well as the risks of going home, and maintains that she would like to go home. The patient will have to sign out of the emergency department AGAINST MEDICAL ADVICE in this case, however I will arrange her outpatient MRI as I would rather that she has one performed and not at all. I urged her  to return to the emergency department at any time if she changed her mind and she stated her understanding. MDM Stroke, TIA, conversion disorder, seizure      Felisa Bonier, MD 08/01/10 [phone]

## 2010-08-01 NOTE — ED Notes (Signed)
Radiology notified of scheduled MRI

## 2010-08-03 ENCOUNTER — Ambulatory Visit (HOSPITAL_BASED_OUTPATIENT_CLINIC_OR_DEPARTMENT_OTHER)
Admission: RE | Admit: 2010-08-03 | Discharge: 2010-08-03 | Disposition: A | Discharge status: Home / Self Care | Payer: Managed Care, Other (non HMO) | Source: Ambulatory Visit | Attending: Emergency Medicine | Admitting: Emergency Medicine

## 2010-08-03 DIAGNOSIS — R930 Abnormal findings on diagnostic imaging of skull and head, not elsewhere classified: Secondary | ICD-10-CM

## 2010-08-03 DIAGNOSIS — R4789 Other speech disturbances: Secondary | ICD-10-CM | POA: Insufficient documentation

## 2010-08-03 MED ORDER — GADOBENATE DIMEGLUMINE 529 MG/ML IV SOLN
20.0000 mL | Freq: Once | INTRAVENOUS | Status: AC | PRN
  Administered 2010-08-03: 20 mL via INTRAVENOUS

## 2010-10-02 ENCOUNTER — Other Ambulatory Visit: Payer: Self-pay | Admitting: Internal Medicine

## 2010-10-02 ENCOUNTER — Ambulatory Visit
Admission: RE | Admit: 2010-10-02 | Discharge: 2010-10-02 | Disposition: A | Discharge status: Home / Self Care | Payer: Managed Care, Other (non HMO) | Source: Ambulatory Visit | Attending: Internal Medicine | Admitting: Internal Medicine

## 2010-11-06 ENCOUNTER — Emergency Department (INDEPENDENT_AMBULATORY_CARE_PROVIDER_SITE_OTHER): Payer: Managed Care, Other (non HMO)

## 2010-11-06 ENCOUNTER — Encounter (HOSPITAL_BASED_OUTPATIENT_CLINIC_OR_DEPARTMENT_OTHER): Payer: Self-pay | Admitting: Emergency Medicine

## 2010-11-06 ENCOUNTER — Emergency Department (HOSPITAL_BASED_OUTPATIENT_CLINIC_OR_DEPARTMENT_OTHER)
Admission: EM | Admit: 2010-11-06 | Discharge: 2010-11-06 | Disposition: A | Discharge status: Home / Self Care | Payer: Managed Care, Other (non HMO) | Attending: Emergency Medicine | Admitting: Emergency Medicine

## 2010-11-06 DIAGNOSIS — F329 Major depressive disorder, single episode, unspecified: Secondary | ICD-10-CM | POA: Insufficient documentation

## 2010-11-06 DIAGNOSIS — N201 Calculus of ureter: Secondary | ICD-10-CM

## 2010-11-06 DIAGNOSIS — N200 Calculus of kidney: Secondary | ICD-10-CM | POA: Insufficient documentation

## 2010-11-06 DIAGNOSIS — F3289 Other specified depressive episodes: Secondary | ICD-10-CM | POA: Insufficient documentation

## 2010-11-06 DIAGNOSIS — R109 Unspecified abdominal pain: Secondary | ICD-10-CM

## 2010-11-06 DIAGNOSIS — Z79899 Other long term (current) drug therapy: Secondary | ICD-10-CM | POA: Insufficient documentation

## 2010-11-06 DIAGNOSIS — N1330 Unspecified hydronephrosis: Secondary | ICD-10-CM

## 2010-11-06 HISTORY — DX: Gastro-esophageal reflux disease without esophagitis: K21.9

## 2010-11-06 HISTORY — DX: Calculus of kidney: N20.0

## 2010-11-06 LAB — URINALYSIS, ROUTINE W REFLEX MICROSCOPIC
Nitrite: NEGATIVE
Protein, ur: NEGATIVE mg/dL
Specific Gravity, Urine: 1.018 (ref 1.005–1.030)
Urobilinogen, UA: 0.2 mg/dL (ref 0.0–1.0)

## 2010-11-06 LAB — BASIC METABOLIC PANEL
CO2: 24 mEq/L (ref 19–32)
Calcium: 8.5 mg/dL (ref 8.4–10.5)
Chloride: 106 mEq/L (ref 96–112)
Potassium: 4.3 mEq/L (ref 3.5–5.1)
Sodium: 138 mEq/L (ref 135–145)

## 2010-11-06 LAB — DIFFERENTIAL
Basophils Absolute: 0 10*3/uL (ref 0.0–0.1)
Eosinophils Relative: 1 % (ref 0–5)
Lymphocytes Relative: 12 % (ref 12–46)
Lymphs Abs: 1.1 10*3/uL (ref 0.7–4.0)
Neutro Abs: 7.6 10*3/uL (ref 1.7–7.7)
Neutrophils Relative %: 81 % — ABNORMAL HIGH (ref 43–77)

## 2010-11-06 LAB — URINE MICROSCOPIC-ADD ON

## 2010-11-06 LAB — CBC
Platelets: 220 10*3/uL (ref 150–400)
RBC: 4.42 MIL/uL (ref 3.87–5.11)
RDW: 17 % — ABNORMAL HIGH (ref 11.5–15.5)
WBC: 9.3 10*3/uL (ref 4.0–10.5)

## 2010-11-06 MED ORDER — ONDANSETRON HCL 4 MG/2ML IJ SOLN
4.0000 mg | Freq: Once | INTRAMUSCULAR | Status: AC
  Administered 2010-11-06: 4 mg via INTRAVENOUS
  Filled 2010-11-06: qty 2

## 2010-11-06 MED ORDER — SODIUM CHLORIDE 0.9 % IV SOLN
999.0000 mL | Freq: Once | INTRAVENOUS | Status: AC
  Administered 2010-11-06: 999 mL via INTRAVENOUS

## 2010-11-06 MED ORDER — HYDROMORPHONE HCL 1 MG/ML IJ SOLN
1.0000 mg | Freq: Once | INTRAMUSCULAR | Status: AC
  Administered 2010-11-06: 1 mg via INTRAVENOUS
  Filled 2010-11-06: qty 1

## 2010-11-06 MED ORDER — KETOROLAC TROMETHAMINE 30 MG/ML IJ SOLN
30.0000 mg | Freq: Once | INTRAMUSCULAR | Status: AC
  Administered 2010-11-06: 30 mg via INTRAVENOUS
  Filled 2010-11-06: qty 1

## 2010-11-06 MED ORDER — SODIUM CHLORIDE 0.9 % IV SOLN
Freq: Once | INTRAVENOUS | Status: AC
  Administered 2010-11-06: 999 mL via INTRAVENOUS

## 2010-11-06 MED ORDER — TAMSULOSIN HCL 0.4 MG PO CAPS: 0.4000 mg | ORAL_CAPSULE | Freq: Every day | ORAL | Status: DC

## 2010-11-06 NOTE — ED Notes (Signed)
Pt has attempted to provide urine sample, unable to at this time

## 2010-11-06 NOTE — ED Notes (Signed)
Pt c/o left flank pain. Pt states she was seen by PMD yesterday and dx with kidney stone. Pt also states urinalysis showed bacteria and urine was sent lab for culture

## 2010-11-06 NOTE — ED Notes (Signed)
Still unable to urinate- pt sts she will try again. Denies pain, has tolerated breakfast from [employer] brought in by family

## 2010-11-06 NOTE — ED Notes (Signed)
Per lab, blood samples hemolyzed; will need redraw.

## 2010-11-06 NOTE — ED Provider Notes (Signed)
History     CSN: [id number] Arrival date & time: 11/06/2010  7:07 AM   First MD Initiated Contact with Patient 11/06/10 [phone]      Chief Complaint  Patient presents with  . Flank Pain    (Consider location/radiation/quality/duration/timing/severity/associated sxs/prior treatment) Patient is a 30 y.o. female presenting with flank pain. The history is provided by the patient.  Flank Pain This is a new problem. The current episode started yesterday. The problem occurs constantly. The problem has been gradually worsening. Associated symptoms include abdominal pain. Pertinent negatives include no chest pain and no shortness of breath. Associated symptoms comments: Left flank pain. The symptoms are aggravated by eating, twisting, drinking and standing (urinating ). The symptoms are relieved by nothing. Treatments tried: percocet helped a little which she started yesterday after seeing her doctor. The treatment provided mild relief.   Pt say PCP yesterday after left flank pain started and had UA done showing some bacteria a blood and dx with kidney stone however last night started getting a lot of pain in the urethral area and more flank pain.  Also c/o of mild urinary retention sx and nausea.  Past Medical History  Diagnosis Date  . Depression   . Kidney stone   . Depression   . GERD (gastroesophageal reflux disease)   . Kidney stone     Past Surgical History  Procedure Date  . Cesarean section     History reviewed. No pertinent family history.  History  Substance Use Topics  . Smoking status: Never Smoker   . Smokeless tobacco: Not on file  . Alcohol Use: No    OB History    Grav Para Term Preterm Abortions TAB SAB Ect Mult Living                  Review of Systems  Constitutional: Negative for fever.  Respiratory: Negative for cough and shortness of breath.   Cardiovascular: Negative for chest pain.  Gastrointestinal: Positive for abdominal pain. Negative for  abdominal distention.       Nausea  Genitourinary: Positive for dysuria, frequency, hematuria, flank pain and difficulty urinating. Negative for vaginal bleeding and vaginal discharge.  Musculoskeletal: Negative for back pain.  Neurological: Negative for light-headedness.  All other systems reviewed and are negative.    Allergies  Review of patient's allergies indicates no known allergies.  Home Medications   Current Outpatient Rx  Name Route Sig Dispense Refill  . ONDANSETRON 4 MG PO TBDP Oral Take 4 mg by mouth every 8 (eight) hours as needed.      . OXYCODONE-ACETAMINOPHEN 5-325 MG PO TABS Oral Take 1 tablet by mouth every 4 (four) hours as needed.      . BUPROPION HCL ER (XL) 300 MG PO TB24 Oral Take 150 mg by mouth daily.     Marland Kitchen GABAPENTIN (PHN) PO Oral Take 300 mg by mouth daily. Take 1 capsule in the morning and 2 capsules at bedtime    . TRAZODONE HCL 50 MG PO TABS Oral Take 75 mg by mouth at bedtime.     . VENLAFAXINE HCL 150 MG PO CP24 Oral Take 150 mg by mouth daily.        BP 161/103  Pulse 75  Temp(Src) 97.4 F (36.3 C) (Oral)  Resp 18  SpO2 99%  Physical Exam  Nursing note and vitals reviewed. Constitutional: She is oriented to person, place, and time. She appears well-developed and well-nourished. She appears distressed.  HENT:  Head:  Normocephalic and atraumatic.  Mouth/Throat: Mucous membranes are dry.  Eyes: EOM are normal. Pupils are equal, round, and reactive to light.  Cardiovascular: Normal rate, regular rhythm, normal heart sounds and intact distal pulses.  Exam reveals no friction rub.   No murmur heard. Pulmonary/Chest: Effort normal and breath sounds normal. She has no wheezes. She has no rales.  Abdominal: Soft. Bowel sounds are normal. She exhibits no distension. There is tenderness. There is no rebound and no guarding.       Mild tenderness in the left suprapubic region and left sided flank pain that is moderate  Musculoskeletal: Normal range of  motion. She exhibits no tenderness.       No edema  Neurological: She is alert and oriented to person, place, and time. No cranial nerve deficit.  Skin: Skin is warm and dry. No rash noted.  Psychiatric: She has a normal mood and affect. Her behavior is normal.    ED Course  Procedures (including critical care time)   Labs Reviewed  PREGNANCY, URINE  URINALYSIS, ROUTINE W REFLEX MICROSCOPIC  CBC  DIFFERENTIAL  BASIC METABOLIC PANEL   No results found.   No diagnosis found.    MDM  Pt with symptoms consistent with kidney stone.  Denies infectious sx, or GI symptoms.  Low concern for diverticulitis and no risk factors or history suggestive of AAA.  No hx suggestive of GU source (discharge) and otherwise pt is healthy.  Will hydrate, treat pain and ensure no infection with UA, CBC, BMP and will get stone study to further eval.  Pt with stones in the past during pregnancy but never received CT to eval for stones and states have never lasted this long before.  CT showed a 4.41mm stone in the UVJ with mild hydronephrosis.  CBC and BMP wnl.  On re-eval pt still having pain and given toradol and dilaudid which she stated helped. UA without infection and pt sent home with flomax        Gwyneth Sprout, MD 11/06/10 1557

## 2011-05-20 ENCOUNTER — Other Ambulatory Visit: Payer: Self-pay | Admitting: Nurse Practitioner

## 2011-05-20 DIAGNOSIS — N760 Acute vaginitis: Secondary | ICD-10-CM | POA: Insufficient documentation

## 2011-05-20 DIAGNOSIS — Z01419 Encounter for gynecological examination (general) (routine) without abnormal findings: Secondary | ICD-10-CM | POA: Insufficient documentation

## 2011-05-26 ENCOUNTER — Other Ambulatory Visit (HOSPITAL_COMMUNITY)
Admission: RE | Admit: 2011-05-26 | Discharge: 2011-05-26 | Disposition: A | Discharge status: Home / Self Care | Payer: Managed Care, Other (non HMO) | Source: Ambulatory Visit | Attending: Obstetrics and Gynecology | Admitting: Obstetrics and Gynecology

## 2011-07-02 ENCOUNTER — Encounter (HOSPITAL_COMMUNITY): Payer: Self-pay

## 2011-07-02 ENCOUNTER — Emergency Department (HOSPITAL_COMMUNITY)
Admission: EM | Admit: 2011-07-02 | Discharge: 2011-07-05 | Disposition: A | Discharge status: Other Acute Inpatient Hospital | Payer: Managed Care, Other (non HMO) | Attending: Emergency Medicine | Admitting: Emergency Medicine

## 2011-07-02 DIAGNOSIS — R42 Dizziness and giddiness: Secondary | ICD-10-CM | POA: Insufficient documentation

## 2011-07-02 DIAGNOSIS — F329 Major depressive disorder, single episode, unspecified: Secondary | ICD-10-CM

## 2011-07-02 DIAGNOSIS — R45851 Suicidal ideations: Secondary | ICD-10-CM | POA: Insufficient documentation

## 2011-07-02 DIAGNOSIS — F32A Depression, unspecified: Secondary | ICD-10-CM

## 2011-07-02 DIAGNOSIS — R51 Headache: Secondary | ICD-10-CM | POA: Insufficient documentation

## 2011-07-02 DIAGNOSIS — F3289 Other specified depressive episodes: Secondary | ICD-10-CM | POA: Insufficient documentation

## 2011-07-02 LAB — CBC
HCT: 38.4 % (ref 36.0–46.0)
Hemoglobin: 12.2 g/dL (ref 12.0–15.0)
MCH: 25.3 pg — ABNORMAL LOW (ref 26.0–34.0)
MCHC: 31.8 g/dL (ref 30.0–36.0)
MCV: 79.7 fL (ref 78.0–100.0)
Platelets: 338 10*3/uL (ref 150–400)
RBC: 4.82 MIL/uL (ref 3.87–5.11)
RDW: 15.7 % — ABNORMAL HIGH (ref 11.5–15.5)
WBC: 9.3 10*3/uL (ref 4.0–10.5)

## 2011-07-02 LAB — RAPID URINE DRUG SCREEN, HOSP PERFORMED
Amphetamines: NOT DETECTED
Barbiturates: NOT DETECTED
Benzodiazepines: NOT DETECTED
Cocaine: NOT DETECTED
Opiates: NOT DETECTED
Tetrahydrocannabinol: NOT DETECTED

## 2011-07-02 LAB — COMPREHENSIVE METABOLIC PANEL
ALT: 22 U/L (ref 0–35)
AST: 15 U/L (ref 0–37)
Albumin: 4 g/dL (ref 3.5–5.2)
Alkaline Phosphatase: 112 U/L (ref 39–117)
BUN: 14 mg/dL (ref 6–23)
CO2: 26 mEq/L (ref 19–32)
Calcium: 9.4 mg/dL (ref 8.4–10.5)
Chloride: 101 mEq/L (ref 96–112)
Creatinine, Ser: 0.82 mg/dL (ref 0.50–1.10)
GFR calc Af Amer: >90 mL/min (ref >90)
GFR calc non Af Amer: >90 mL/min (ref >90)
Glucose, Bld: 93 mg/dL (ref 70–99)
Potassium: 3.9 mEq/L (ref 3.5–5.1)
Sodium: 137 mEq/L (ref 135–145)
Total Bilirubin: 0.2 mg/dL — ABNORMAL LOW (ref 0.3–1.2)
Total Protein: 8.3 g/dL (ref 6.0–8.3)

## 2011-07-02 LAB — PREGNANCY, URINE: Preg Test, Ur: NEGATIVE

## 2011-07-02 LAB — ETHANOL: Alcohol, Ethyl (B): <11 mg/dL (ref 0–11)

## 2011-07-02 MED ORDER — BUPROPION HCL ER (XL) 150 MG PO TB24
150.0000 mg | ORAL_TABLET | Freq: Every day | ORAL | Status: DC
  Administered 2011-07-03 – 2011-07-05 (×3): 150 mg via ORAL
  Filled 2011-07-02 (×3): qty 1

## 2011-07-02 MED ORDER — TRAZODONE HCL 50 MG PO TABS
50.0000 mg | ORAL_TABLET | Freq: Every day | ORAL | Status: DC
  Administered 2011-07-03 – 2011-07-04 (×3): 50 mg via ORAL
  Filled 2011-07-02 (×3): qty 1

## 2011-07-02 MED ORDER — FLUOXETINE HCL 20 MG PO CAPS
60.0000 mg | ORAL_CAPSULE | Freq: Every day | ORAL | Status: DC
  Administered 2011-07-03 – 2011-07-05 (×3): 60 mg via ORAL
  Filled 2011-07-02 (×3): qty 3

## 2011-07-02 MED ORDER — ONDANSETRON HCL 4 MG PO TABS
4.0000 mg | ORAL_TABLET | Freq: Three times a day (TID) | ORAL | Status: DC | PRN
  Filled 2011-07-02: qty 1

## 2011-07-02 MED ORDER — AMPHETAMINE-DEXTROAMPHETAMINE 20 MG PO TABS
20.0000 mg | ORAL_TABLET | Freq: Every day | ORAL | Status: DC
  Administered 2011-07-03 – 2011-07-05 (×3): 20 mg via ORAL
  Filled 2011-07-02 (×3): qty 1

## 2011-07-02 MED ORDER — ALBUTEROL SULFATE HFA 108 (90 BASE) MCG/ACT IN AERS
2.0000 | INHALATION_SPRAY | Freq: Four times a day (QID) | RESPIRATORY_TRACT | Status: DC | PRN
Start: 2011-07-02 — End: 2011-07-05
  Filled 2011-07-02: qty 6.7

## 2011-07-02 MED ORDER — ALUM & MAG HYDROXIDE-SIMETH 200-200-20 MG/5ML PO SUSP
30.0000 mL | ORAL | Status: DC | PRN
  Administered 2011-07-03 – 2011-07-04 (×2): 30 mL via ORAL
  Filled 2011-07-02 (×2): qty 30

## 2011-07-02 MED ORDER — PANTOPRAZOLE SODIUM 40 MG PO TBEC
40.0000 mg | DELAYED_RELEASE_TABLET | Freq: Every day | ORAL | Status: DC
  Administered 2011-07-03 – 2011-07-05 (×4): 40 mg via ORAL
  Filled 2011-07-02 (×4): qty 1

## 2011-07-02 MED ORDER — VENLAFAXINE HCL ER 75 MG PO CP24
112.5000 mg | ORAL_CAPSULE | Freq: Every day | ORAL | Status: DC
  Administered 2011-07-03 – 2011-07-05 (×3): 112.5 mg via ORAL
  Filled 2011-07-02 (×3): qty 1

## 2011-07-02 MED ORDER — IBUPROFEN 800 MG PO TABS: 800.0000 mg | ORAL_TABLET | Freq: Three times a day (TID) | ORAL | Status: DC | PRN

## 2011-07-02 MED ORDER — BUDESONIDE-FORMOTEROL FUMARATE 80-4.5 MCG/ACT IN AERO
2.0000 | INHALATION_SPRAY | Freq: Two times a day (BID) | RESPIRATORY_TRACT | Status: DC
  Administered 2011-07-02 – 2011-07-05 (×7): 2 via RESPIRATORY_TRACT
  Filled 2011-07-02: qty 6.9

## 2011-07-02 MED ORDER — MAGNESIUM HYDROXIDE 400 MG/5ML PO SUSP
30.0000 mL | Freq: Every day | ORAL | Status: DC | PRN
  Filled 2011-07-02: qty 30

## 2011-07-02 MED ORDER — ACETAMINOPHEN 325 MG PO TABS
650.0000 mg | ORAL_TABLET | Freq: Four times a day (QID) | ORAL | Status: DC | PRN
Start: 2011-07-02 — End: 2011-07-05

## 2011-07-02 NOTE — ED Notes (Signed)
Pt reports currently trying to wean herself off her Effexor and having some symptoms of fatigue, n/v, mood swings, agitation, pt denies SI/HI/AVH, pt affect extremely flat, pt pleasant, cooperative, wants inpt tx

## 2011-07-02 NOTE — ED Provider Notes (Signed)
History     CSN: [id number]  Arrival date & time 07/02/11  1918   First MD Initiated Contact with Patient 07/02/11 2121      Chief Complaint  Patient presents with  . Depression    HPI  History provided by the patient. Patient is a 31 year old female with history of anxiety and depression who presents with complaints of worsening depression. Patient states that she is followed by Dr. [place] Dragon psychiatry for her depression. Patient had recently been having side effects from her Effexor education. Patient was trying to be weaned off this medication. Patient states that when she was producing down to one dose daily she was having withdrawal symptoms that were too severe and she was bumped back up to twice a day dosing. Patient states that this dosing she is still continued to have some increasing depression despite being on Prozac to try this helps supplement depression. Patient states that she has been having increased suicidal ideations. Patient is very concerned about possibly acting on these. Patient denies any other symptoms. She denies any hallucinations or HI. Patient is not have any SI plan. She does report having past history when she was young about similar thoughts and states she came close when she had access to a hand gun at one time.    Past Medical History  Diagnosis Date  . Depression   . Kidney stone   . Depression   . GERD (gastroesophageal reflux disease)   . Kidney stone     Past Surgical History  Procedure Date  . Cesarean section     History reviewed. No pertinent family history.  History  Substance Use Topics  . Smoking status: Never Smoker   . Smokeless tobacco: Not on file  . Alcohol Use: Yes     social    OB History    Grav Para Term Preterm Abortions TAB SAB Ect Mult Living                  Review of Systems  Constitutional: Negative for fever and chills.  Gastrointestinal: Negative for abdominal pain.  Neurological: Positive for  light-headedness and headaches. Negative for dizziness.  Psychiatric/Behavioral: Positive for suicidal ideas. Negative for hallucinations, confusion and self-injury.    Allergies  Review of patient's allergies indicates no known allergies.  Home Medications   Current Outpatient Rx  Name Route Sig Dispense Refill  . ACETAMINOPHEN 500 MG PO TABS Oral Take 2,000 mg by mouth daily as needed. For pain.    . ALBUTEROL SULFATE HFA 108 (90 BASE) MCG/ACT IN AERS Inhalation Inhale 2 puffs into the lungs every 6 (six) hours as needed. For shortness of breath.    . AMPHETAMINE-DEXTROAMPHETAMINE 20 MG PO TABS Oral Take 20 mg by mouth 3 (three) times daily as needed. For alertness.    . BUDESONIDE-FORMOTEROL FUMARATE 80-4.5 MCG/ACT IN AERO Inhalation Inhale 2 puffs into the lungs 2 (two) times daily.    . BUPROPION HCL ER (XL) 150 MG PO TB24 Oral Take 150 mg by mouth daily.      Marland Kitchen FLUOXETINE HCL 20 MG PO CAPS Oral Take 60 mg by mouth daily.    . IBUPROFEN 200 MG PO TABS Oral Take 800 mg by mouth every 8 (eight) hours as needed. For pain    . ONDANSETRON HCL 4 MG PO TABS Oral Take 4 mg by mouth every 8 (eight) hours as needed. For nausea.    Marland Kitchen PANTOPRAZOLE SODIUM 40 MG PO TBEC Oral Take 40  mg by mouth daily.    . TRAZODONE HCL 50 MG PO TABS Oral Take 50 mg by mouth at bedtime.     . VENLAFAXINE HCL ER 37.5 MG PO CP24 Oral Take 112.5 mg by mouth daily.      BP 115/84  Pulse 76  Temp 98.5 F (36.9 C) (Oral)  Resp 20  SpO2 100%  Physical Exam  Nursing note and vitals reviewed. Constitutional: She is oriented to person, place, and time. She appears well-developed and well-nourished. No distress.  HENT:  Head: Normocephalic and atraumatic.  Cardiovascular: Normal rate and regular rhythm.   Pulmonary/Chest: Effort normal and breath sounds normal. She has no wheezes. She has no rales.  Neurological: She is alert and oriented to person, place, and time.  Skin: Skin is warm and dry.  Psychiatric: Her  behavior is normal. She exhibits a depressed mood. She expresses suicidal ideation.    ED Course  Procedures   Results for orders placed during the hospital encounter of 07/02/11  CBC      Component Value Range   WBC 9.3  4.0 - 10.5 K/uL   RBC 4.82  3.87 - 5.11 MIL/uL   Hemoglobin 12.2  12.0 - 15.0 g/dL   HCT 16.1  09.6 - 04.5 %   MCV 79.7  78.0 - 100.0 fL   MCH 25.3 (*) 26.0 - 34.0 pg   MCHC 31.8  30.0 - 36.0 g/dL   RDW 40.9 (*) 81.1 - 91.4 %   Platelets 338  150 - 400 K/uL  COMPREHENSIVE METABOLIC PANEL      Component Value Range   Sodium 137  135 - 145 mEq/L   Potassium 3.9  3.5 - 5.1 mEq/L   Chloride 101  96 - 112 mEq/L   CO2 26  19 - 32 mEq/L   Glucose, Bld 93  70 - 99 mg/dL   BUN 14  6 - 23 mg/dL   Creatinine, Ser 7.82  0.50 - 1.10 mg/dL   Calcium 9.4  8.4 - 95.6 mg/dL   Total Protein 8.3  6.0 - 8.3 g/dL   Albumin 4.0  3.5 - 5.2 g/dL   AST 15  0 - 37 U/L   ALT 22  0 - 35 U/L   Alkaline Phosphatase 112  39 - 117 U/L   Total Bilirubin 0.2 (*) 0.3 - 1.2 mg/dL   GFR calc non Af Amer >90  >90 mL/min   GFR calc Af Amer >90  >90 mL/min  ETHANOL      Component Value Range   Alcohol, Ethyl (B) <11  0 - 11 mg/dL  URINE RAPID DRUG SCREEN (HOSP PERFORMED)      Component Value Range   Opiates NONE DETECTED  NONE DETECTED   Cocaine NONE DETECTED  NONE DETECTED   Benzodiazepines NONE DETECTED  NONE DETECTED   Amphetamines NONE DETECTED  NONE DETECTED   Tetrahydrocannabinol NONE DETECTED  NONE DETECTED   Barbiturates NONE DETECTED  NONE DETECTED  PREGNANCY, URINE      Component Value Range   Preg Test, Ur NEGATIVE  NEGATIVE        1. Suicidal ideation   2. Depression       MDM  Patient seen and evaluated. Patient in no acute distress.   Pt moved to Psych holding.  Orders written. Patient discussed with [facility] act team. They will see patient and his son's.     Angus Seller, [place] 07/03/11 [phone]

## 2011-07-02 NOTE — ED Notes (Signed)
Pt states she is being tampered off Effexor and is c/o of withdraw. States she is depressed and having suicidal thoughts, mood swings, headache, fatigue and nausea. Pt states she does not have a suicidal plan but does not want to let the depression get to that point.

## 2011-07-03 NOTE — ED Notes (Signed)
Patient is resting comfortably. 

## 2011-07-03 NOTE — ED Notes (Signed)
Report received-airway intact-no s/s's of distress-resting quietly-will continue to monitor 
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
## 2011-07-03 NOTE — ED Notes (Signed)
Patient is resting comfortably. No issues. Compliant. Says she feels nauseous and has a slight headache but doesn't want anything to make her feel better at this time

## 2011-07-03 NOTE — ED Notes (Signed)
White metal necklace with charms placed with patients belongings in locker 38

## 2011-07-03 NOTE — Progress Notes (Signed)
Relationships Group  Facilitated a group with Claudia Radon McKibben, MS, LPCA, NCC. We encouraged group members to talk about struggles in their relationships with others, and we discussed strategies for managing difficulties in relationships. Group members were supportive of and respectful toward one another.  The patient talked about some of the depression that she has been struggling with. She said that it frustrates her when her husband doesn't realize how difficult it is for her to do things (because of the depression). The patient also talked about her three-year-old girl, and said that she brings her a lot of joy. The patient described an event that occurred yesterday where she was frustrated with her daughter and raised her voice with her. She was tearful when describing this situation. After this event, the patient said that she wanted to be admitted into the hospital so that she could get some help. At the end of the session, the patient said that she felt "relief" after having the opportunity to share her story.  [name] MA, MED, LPCA, NCC

## 2011-07-03 NOTE — ED Notes (Signed)
Pt states she is here because she is having trouble weaning self off of Effexor-states she is not having thoughts of hurting self-feels safe in hospital-pt with flat affect, soft spoken, states she is tired-will continue to monitor
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
## 2011-07-04 NOTE — ED Provider Notes (Signed)
Filed Vitals:   07/04/11 0532  BP: 106/62  Pulse: 74  Temp: 97.7 F (36.5 C)  Resp: 18  Patient is resting comfortably. Remains stable overnight without any complaints. Awaiting formal disposition from psychiatry.  Labs reviewed.  Orders reviewed and appropriate    Dayton Bailiff, MD 07/04/11 [phone]

## 2011-07-04 NOTE — ED Notes (Signed)
This writer went into pt room to give a message. Patient was on the floor wiping up food. I told patient I would have EVS come and mop. Patient stated "I just don't give a fuck". Asked patient if she would like to see her RN and patient would not respond. Notified Katha, RN and nurse into room with patient.

## 2011-07-04 NOTE — ED Notes (Signed)
Pt aware of need for IVC to allow admission, agreeable

## 2011-07-04 NOTE — ED Notes (Signed)
IVC papers sent to magistrate, for admission to [facility]

## 2011-07-04 NOTE — ED Notes (Signed)
Documentation faxed to [employer] to show cause for inpatient treatment of pt, per her request.Reciept given to pt for her records.

## 2011-07-04 NOTE — ED Provider Notes (Signed)
Medical screening examination/treatment/procedure(s) were performed by non-physician practitioner and as supervising physician I was immediately available for consultation/collaboration.  Temisha Murley, MD 07/04/11 1243 

## 2011-07-04 NOTE — ED Notes (Signed)
In to pt room to inform pt that MSW was working on finding placement for inpt treatment. Pt upset that process was taking "so long". Informed that this is normal with placement process as inpt tx beds are few in [place]. Encouraged pt to stay calm and be patient, that effort was being made.

## 2011-07-05 NOTE — ED Provider Notes (Addendum)
BP 113/78  Pulse 81  Temp 98.3 F (36.8 C) (Oral)  Resp 18  SpO2 97%  Delayed entry. IVC filled out previously by me for transport purposes (transfer pending per ACT)  Forbes Cellar, MD 07/05/11 1610  Forbes Cellar, MD 07/05/11 0025
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
## 2011-07-05 NOTE — ED Provider Notes (Signed)
  Physical Exam  BP 119/79  Pulse 82  Temp 98 F (36.7 C) (Oral)  Resp 18  SpO2 100%  Physical Exam  ED Course  Procedures  MDM Patient has been accepted at [facility]. Dr Clerance Lav. trasfered      Juliet Rude. Rubin Payor, MD 07/05/11 [phone]

## 2011-11-30 ENCOUNTER — Emergency Department (HOSPITAL_BASED_OUTPATIENT_CLINIC_OR_DEPARTMENT_OTHER)
Admission: EM | Admit: 2011-11-30 | Discharge: 2011-12-01 | Disposition: A | Discharge status: Home / Self Care | Payer: Managed Care, Other (non HMO) | Attending: Emergency Medicine | Admitting: Emergency Medicine

## 2011-11-30 ENCOUNTER — Encounter (HOSPITAL_BASED_OUTPATIENT_CLINIC_OR_DEPARTMENT_OTHER): Payer: Self-pay | Admitting: *Deleted

## 2011-11-30 DIAGNOSIS — Z79899 Other long term (current) drug therapy: Secondary | ICD-10-CM | POA: Insufficient documentation

## 2011-11-30 DIAGNOSIS — F329 Major depressive disorder, single episode, unspecified: Secondary | ICD-10-CM | POA: Insufficient documentation

## 2011-11-30 DIAGNOSIS — F3289 Other specified depressive episodes: Secondary | ICD-10-CM | POA: Insufficient documentation

## 2011-11-30 DIAGNOSIS — K2970 Gastritis, unspecified, without bleeding: Secondary | ICD-10-CM

## 2011-11-30 DIAGNOSIS — Z87442 Personal history of urinary calculi: Secondary | ICD-10-CM | POA: Insufficient documentation

## 2011-11-30 DIAGNOSIS — R112 Nausea with vomiting, unspecified: Secondary | ICD-10-CM | POA: Insufficient documentation

## 2011-11-30 DIAGNOSIS — J45909 Unspecified asthma, uncomplicated: Secondary | ICD-10-CM | POA: Insufficient documentation

## 2011-11-30 DIAGNOSIS — K219 Gastro-esophageal reflux disease without esophagitis: Secondary | ICD-10-CM | POA: Insufficient documentation

## 2011-11-30 HISTORY — DX: Unspecified intestinal obstruction, unspecified as to partial versus complete obstruction: K56.609

## 2011-11-30 HISTORY — DX: Unspecified asthma, uncomplicated: J45.909

## 2011-11-30 LAB — URINALYSIS, ROUTINE W REFLEX MICROSCOPIC
Bilirubin Urine: NEGATIVE
Hgb urine dipstick: NEGATIVE
Ketones, ur: NEGATIVE mg/dL
Specific Gravity, Urine: 1.023 (ref 1.005–1.030)
Urobilinogen, UA: 0.2 mg/dL (ref 0.0–1.0)

## 2011-11-30 LAB — CBC WITH DIFFERENTIAL/PLATELET
Eosinophils Absolute: 0.2 10*3/uL (ref 0.0–0.7)
Eosinophils Relative: 2 % (ref 0–5)
Hemoglobin: 11 g/dL — ABNORMAL LOW (ref 12.0–15.0)
Lymphs Abs: 2.7 10*3/uL (ref 0.7–4.0)
MCH: 25.2 pg — ABNORMAL LOW (ref 26.0–34.0)
MCV: 78.5 fL (ref 78.0–100.0)
Monocytes Relative: 6 % (ref 3–12)
Platelets: 269 10*3/uL (ref 150–400)
RBC: 4.37 MIL/uL (ref 3.87–5.11)

## 2011-11-30 MED ORDER — ONDANSETRON HCL 4 MG/2ML IJ SOLN
4.0000 mg | Freq: Once | INTRAMUSCULAR | Status: AC
  Administered 2011-11-30: 4 mg via INTRAVENOUS
  Filled 2011-11-30: qty 2

## 2011-11-30 MED ORDER — GI COCKTAIL ~~LOC~~
30.0000 mL | Freq: Once | ORAL | Status: AC
  Administered 2011-11-30: 30 mL via ORAL
  Filled 2011-11-30: qty 30

## 2011-11-30 MED ORDER — FENTANYL CITRATE 0.05 MG/ML IJ SOLN
50.0000 ug | Freq: Once | INTRAMUSCULAR | Status: AC
  Administered 2011-11-30: 50 ug via INTRAVENOUS
  Filled 2011-11-30: qty 2

## 2011-11-30 NOTE — ED Provider Notes (Addendum)
History    This chart was scribed for Lerone Onder Smitty Cords, MD, MD by Smitty Pluck, ED Scribe. The patient was seen in room MH06 and the patient's care was started at 11:17PM.   CSN: [id number]  Arrival date & time 11/30/11  2253   First MD Initiated Contact with Patient 11/30/11 2305      Chief Complaint  Patient presents with  . Abdominal Pain    (Consider location/radiation/quality/duration/timing/severity/associated sxs/prior treatment) Patient is a 31 y.o. female presenting with abdominal pain. The history is provided by the patient. No language interpreter was used.  Abdominal Pain The primary symptoms of the illness include abdominal pain, nausea and vomiting. The primary symptoms of the illness do not include fever, shortness of breath, diarrhea or dysuria. The current episode started more than 2 days ago. The onset of the illness was sudden. The problem has not changed since onset. The abdominal pain began more than 2 days ago. The pain came on suddenly. The abdominal pain has been unchanged since its onset. The abdominal pain is located in the epigastric region. The abdominal pain does not radiate. The severity of the abdominal pain is 10/10. The abdominal pain is relieved by nothing. The abdominal pain is exacerbated by eating and fatty foods.  The illness is associated with eating. Symptoms associated with the illness do not include chills, anorexia, constipation or back pain. Significant associated medical issues include GERD.   Claudia Walsh is a 31 y.o. female who presents to the Emergency Department complaining of intermittent, moderate abdominal pain onset 1 week ago with symptoms worsening today 7 hours ago. Pt reports pain started after eating a quesadilla. She thinks pain is associated with eating. Pt took tylenol without relief. Denies diarrhea, fever, constipation and any other pain. Pt reports emesis 4x today. Pt takes protonix. Pt has IUD and does not have  normal menstrual cycle. Last bowel movement was 1 day ago.   Past Medical History  Diagnosis Date  . Depression   . Kidney stone   . Depression   . GERD (gastroesophageal reflux disease)   . Kidney stone   . Small bowel obstruction   . Asthma     Past Surgical History  Procedure Date  . Cesarean section     No family history on file.  History  Substance Use Topics  . Smoking status: Never Smoker   . Smokeless tobacco: Never Used  . Alcohol Use: Yes     Comment: rare    OB History    Grav Para Term Preterm Abortions TAB SAB Ect Mult Living                  Review of Systems  Constitutional: Negative for fever and chills.  Respiratory: Negative for shortness of breath.   Cardiovascular: Negative for chest pain.  Gastrointestinal: Positive for nausea, vomiting and abdominal pain. Negative for diarrhea, constipation and anorexia.  Genitourinary: Negative for dysuria and dyspareunia.  Musculoskeletal: Negative for back pain.  All other systems reviewed and are negative.    Allergies  Review of patient's allergies indicates no known allergies.  Home Medications   Current Outpatient Rx  Name  Route  Sig  Dispense  Refill  . ACETAMINOPHEN 500 MG PO TABS   Oral   Take 2,000 mg by mouth daily as needed. For pain.         . ALBUTEROL SULFATE HFA 108 (90 BASE) MCG/ACT IN AERS   Inhalation   Inhale 2 puffs  into the lungs every 6 (six) hours as needed. For shortness of breath.         . BUDESONIDE-FORMOTEROL FUMARATE 80-4.5 MCG/ACT IN AERO   Inhalation   Inhale 2 puffs into the lungs 2 (two) times daily.         . IBUPROFEN 200 MG PO TABS   Oral   Take 800 mg by mouth every 8 (eight) hours as needed. For pain         . PANTOPRAZOLE SODIUM 40 MG PO TBEC   Oral   Take 40 mg by mouth daily.         . AMPHETAMINE-DEXTROAMPHETAMINE 20 MG PO TABS   Oral   Take 20 mg by mouth 3 (three) times daily as needed. For alertness.         . BUPROPION HCL ER  (XL) 150 MG PO TB24   Oral   Take 150 mg by mouth daily.           Marland Kitchen FLUOXETINE HCL 20 MG PO CAPS   Oral   Take 60 mg by mouth daily.         Marland Kitchen ONDANSETRON HCL 4 MG PO TABS   Oral   Take 4 mg by mouth every 8 (eight) hours as needed. For nausea.         . TRAZODONE HCL 50 MG PO TABS   Oral   Take 50 mg by mouth at bedtime.          . VENLAFAXINE HCL ER 37.5 MG PO CP24   Oral   Take 112.5 mg by mouth daily.           BP 121/71  Pulse 87  Temp 98.1 F (36.7 C) (Oral)  Resp 16  Ht 5\' 4"  (1.626 m)  Wt 325 lb (147.419 kg)  BMI 55.79 kg/m2  SpO2 99%  Physical Exam  Nursing note and vitals reviewed. Constitutional: She is oriented to person, place, and time. She appears well-developed and well-nourished. No distress.  HENT:  Head: Normocephalic and atraumatic.  Mouth/Throat: Oropharynx is clear and moist. No oropharyngeal exudate.  Eyes: Conjunctivae normal and EOM are normal. Pupils are equal, round, and reactive to light.  Neck: Normal range of motion. Neck supple.  Cardiovascular: Normal rate, regular rhythm and normal heart sounds.   Pulmonary/Chest: Effort normal. No respiratory distress. She has no wheezes. She has no rales.  Abdominal: Soft. Bowel sounds are normal. There is tenderness (mild epigastric ). There is no rebound and no guarding.       Mild epigastric   Musculoskeletal: Normal range of motion. She exhibits edema.  Neurological: She is alert and oriented to person, place, and time. She has normal reflexes.  Skin: Skin is warm and dry.  Psychiatric: She has a normal mood and affect. Her behavior is normal.    ED Course  Procedures (including critical care time) DIAGNOSTIC STUDIES: Oxygen Saturation is 99% on room air, normal by my interpretation.    COORDINATION OF CARE: 11:21 PM Discussed ED treatment with pt  11:32 PM Ordered:   Medications  ondansetron (ZOFRAN) injection 4 mg (4 mg Intravenous Given 11/30/11 2349)  fentaNYL  (SUBLIMAZE) injection 50 mcg (50 mcg Intravenous Given 11/30/11 2350)  gi cocktail (Maalox,Lidocaine,Donnatal) (30 mL Oral Given 11/30/11 2352)       Labs Reviewed  CBC WITH DIFFERENTIAL - Abnormal; Notable for the following:    Hemoglobin 11.0 (*)     HCT 34.3 (*)  MCH 25.2 (*)     All other components within normal limits  COMPREHENSIVE METABOLIC PANEL - Abnormal; Notable for the following:    Glucose, Bld 106 (*)     Total Bilirubin 0.2 (*)     All other components within normal limits  PREGNANCY, URINE  URINALYSIS, ROUTINE W REFLEX MICROSCOPIC  LIPASE, BLOOD   No results found.   No diagnosis found.    MDM  Suspect severe gerd and gastritis but will arrange for outpatient [place] to excluded biliary disease.  Will refer to GI as patient is now stating she needs to double up on protonix at bedtime as she has acid coming up nightly.  Patient advised bland diet, no greasy spicy food, no eating 3 hours before bedtime.  Sleeping with head of bead elevated.  Return for outpatient [place] later today.  Return to the Ed for fevers > 101, pain that localizes to the right lower quadrant, bleeding inability to pass gas, abdominal stiffness.  Patient verbalizes understanding and agrees to follow up      I personally performed the services described in this documentation, which was scribed in my presence. The recorded information has been reviewed and is accurate.    Jasmine Awe, MD 12/01/11 1610  Jasmine Awe, MD 12/01/11 0225

## 2011-11-30 NOTE — ED Notes (Signed)
Pt reports she has been having stomach pains off and on x 1 week- worse today- vomited x 3 - vomiting in triage- hx of SBO

## 2011-12-01 ENCOUNTER — Encounter (HOSPITAL_BASED_OUTPATIENT_CLINIC_OR_DEPARTMENT_OTHER): Payer: Self-pay | Admitting: Family Medicine

## 2011-12-01 ENCOUNTER — Emergency Department (HOSPITAL_BASED_OUTPATIENT_CLINIC_OR_DEPARTMENT_OTHER)
Admission: EM | Admit: 2011-12-01 | Discharge: 2011-12-01 | Disposition: A | Discharge status: Home / Self Care | Payer: Managed Care, Other (non HMO) | Attending: Emergency Medicine | Admitting: Emergency Medicine

## 2011-12-01 ENCOUNTER — Emergency Department (HOSPITAL_BASED_OUTPATIENT_CLINIC_OR_DEPARTMENT_OTHER): Payer: Managed Care, Other (non HMO)

## 2011-12-01 ENCOUNTER — Ambulatory Visit (HOSPITAL_BASED_OUTPATIENT_CLINIC_OR_DEPARTMENT_OTHER)
Admit: 2011-12-01 | Discharge: 2011-12-01 | Disposition: A | Discharge status: Home / Self Care | Payer: Managed Care, Other (non HMO) | Attending: Emergency Medicine | Admitting: Emergency Medicine

## 2011-12-01 DIAGNOSIS — K828 Other specified diseases of gallbladder: Secondary | ICD-10-CM | POA: Insufficient documentation

## 2011-12-01 DIAGNOSIS — Z79899 Other long term (current) drug therapy: Secondary | ICD-10-CM | POA: Insufficient documentation

## 2011-12-01 DIAGNOSIS — Z87442 Personal history of urinary calculi: Secondary | ICD-10-CM | POA: Insufficient documentation

## 2011-12-01 DIAGNOSIS — R1013 Epigastric pain: Secondary | ICD-10-CM

## 2011-12-01 DIAGNOSIS — K219 Gastro-esophageal reflux disease without esophagitis: Secondary | ICD-10-CM | POA: Insufficient documentation

## 2011-12-01 DIAGNOSIS — F3289 Other specified depressive episodes: Secondary | ICD-10-CM | POA: Insufficient documentation

## 2011-12-01 DIAGNOSIS — F329 Major depressive disorder, single episode, unspecified: Secondary | ICD-10-CM | POA: Insufficient documentation

## 2011-12-01 DIAGNOSIS — J45909 Unspecified asthma, uncomplicated: Secondary | ICD-10-CM | POA: Insufficient documentation

## 2011-12-01 LAB — COMPREHENSIVE METABOLIC PANEL
BUN: 16 mg/dL (ref 6–23)
Calcium: 9.1 mg/dL (ref 8.4–10.5)
GFR calc Af Amer: >90 mL/min (ref >90)
Glucose, Bld: 106 mg/dL — ABNORMAL HIGH (ref 70–99)
Total Protein: 7.3 g/dL (ref 6.0–8.3)

## 2011-12-01 LAB — LIPASE, BLOOD: Lipase: 24 U/L (ref 11–59)

## 2011-12-01 MED ORDER — SUCRALFATE 1 GM/10ML PO SUSP: 1.0000 g | Freq: Four times a day (QID) | ORAL | Status: DC

## 2011-12-01 MED ORDER — RANITIDINE HCL 150 MG PO CAPS: 150.0000 mg | ORAL_CAPSULE | Freq: Every day | ORAL | Status: DC

## 2011-12-01 MED ORDER — GI COCKTAIL ~~LOC~~
30.0000 mL | Freq: Once | ORAL | Status: AC
  Administered 2011-12-01: 30 mL via ORAL

## 2011-12-01 MED ORDER — TRAMADOL HCL 50 MG PO TABS: 50.0000 mg | ORAL_TABLET | Freq: Four times a day (QID) | ORAL | Status: DC | PRN

## 2011-12-01 MED ORDER — GI COCKTAIL ~~LOC~~
ORAL | Status: AC
  Filled 2011-12-01: qty 30

## 2011-12-01 MED ORDER — OXYCODONE-ACETAMINOPHEN 5-325 MG PO TABS: 1.0000 | ORAL_TABLET | Freq: Four times a day (QID) | ORAL | Status: DC | PRN

## 2011-12-01 MED ORDER — OXYCODONE-ACETAMINOPHEN 5-325 MG PO TABS
2.0000 | ORAL_TABLET | Freq: Once | ORAL | Status: AC
  Administered 2011-12-01: 2 via ORAL
  Filled 2011-12-01 (×2): qty 2

## 2011-12-01 MED ORDER — ONDANSETRON HCL 4 MG PO TABS: 4.0000 mg | ORAL_TABLET | Freq: Three times a day (TID) | ORAL | Status: DC | PRN

## 2011-12-01 NOTE — ED Notes (Signed)
Adam from xray in speaking with pt regarding outpt u/s.

## 2011-12-01 NOTE — ED Notes (Signed)
MD at bedside. 

## 2011-12-01 NOTE — ED Notes (Signed)
Returned from xray

## 2011-12-01 NOTE — ED Notes (Signed)
Pt. States her abdominal  pain is increasing. Dr. Nicanor Alcon aware and orders received.

## 2011-12-01 NOTE — ED Notes (Signed)
Transported to xray 

## 2011-12-01 NOTE — ED Notes (Signed)
Pt c/o abdominal pain and sts she was seen here last night for same and diagnosed with gastritis or gallbladder "unsure". Pt sts pain started after eating today and took tramadol but no relief.

## 2011-12-02 ENCOUNTER — Emergency Department (HOSPITAL_BASED_OUTPATIENT_CLINIC_OR_DEPARTMENT_OTHER)
Admission: EM | Admit: 2011-12-02 | Discharge: 2011-12-03 | Disposition: A | Discharge status: Home / Self Care | Payer: Managed Care, Other (non HMO) | Attending: Emergency Medicine | Admitting: Emergency Medicine

## 2011-12-02 ENCOUNTER — Encounter (HOSPITAL_BASED_OUTPATIENT_CLINIC_OR_DEPARTMENT_OTHER): Payer: Self-pay | Admitting: *Deleted

## 2011-12-02 DIAGNOSIS — F3289 Other specified depressive episodes: Secondary | ICD-10-CM | POA: Insufficient documentation

## 2011-12-02 DIAGNOSIS — K2990 Gastroduodenitis, unspecified, without bleeding: Secondary | ICD-10-CM | POA: Insufficient documentation

## 2011-12-02 DIAGNOSIS — K2970 Gastritis, unspecified, without bleeding: Secondary | ICD-10-CM

## 2011-12-02 DIAGNOSIS — Z79899 Other long term (current) drug therapy: Secondary | ICD-10-CM | POA: Insufficient documentation

## 2011-12-02 DIAGNOSIS — F329 Major depressive disorder, single episode, unspecified: Secondary | ICD-10-CM | POA: Insufficient documentation

## 2011-12-02 DIAGNOSIS — N200 Calculus of kidney: Secondary | ICD-10-CM | POA: Insufficient documentation

## 2011-12-02 DIAGNOSIS — K219 Gastro-esophageal reflux disease without esophagitis: Secondary | ICD-10-CM | POA: Insufficient documentation

## 2011-12-02 DIAGNOSIS — J45909 Unspecified asthma, uncomplicated: Secondary | ICD-10-CM | POA: Insufficient documentation

## 2011-12-02 MED ORDER — SUCRALFATE 1 G PO TABS
1.0000 g | ORAL_TABLET | Freq: Once | ORAL | Status: AC
  Administered 2011-12-03: 1 g via ORAL
  Filled 2011-12-02: qty 1

## 2011-12-02 MED ORDER — SODIUM CHLORIDE 0.9 % IV BOLUS (SEPSIS)
1000.0000 mL | Freq: Once | INTRAVENOUS | Status: AC
  Administered 2011-12-03: 1000 mL via INTRAVENOUS

## 2011-12-02 MED ORDER — PANTOPRAZOLE SODIUM 40 MG IV SOLR
40.0000 mg | Freq: Once | INTRAVENOUS | Status: AC
  Administered 2011-12-03: 40 mg via INTRAVENOUS
  Filled 2011-12-02: qty 40

## 2011-12-02 MED ORDER — PROMETHAZINE HCL 25 MG/ML IJ SOLN
25.0000 mg | Freq: Once | INTRAMUSCULAR | Status: AC
  Administered 2011-12-03: 25 mg via INTRAVENOUS
  Filled 2011-12-02: qty 1

## 2011-12-02 MED ORDER — FENTANYL CITRATE 0.05 MG/ML IJ SOLN
100.0000 ug | Freq: Once | INTRAMUSCULAR | Status: AC
  Administered 2011-12-03: 100 ug via INTRAVENOUS
  Filled 2011-12-02: qty 2

## 2011-12-02 NOTE — ED Notes (Signed)
Pt. Reports she made an appt. With Gastro Dr. For tomorrow.

## 2011-12-02 NOTE — ED Provider Notes (Signed)
History     CSN: [id number]  Arrival date & time 12/01/11  1801   First MD Initiated Contact with Patient 12/01/11 2028      Chief Complaint  Patient presents with  . Abdominal Pain    (Consider location/radiation/quality/duration/timing/severity/associated sxs/prior treatment) HPI Comments: Patient presents with complaint of abdominal pain. Of note patient was seen in ED last night for the same complaint and diagnosed with gastritis. Ultrasound performed at this facility this morning remarkable for gallbladder sludge. Patient states that she was discharged with Rx for Tramadol but that it "wasn't working". Patient states that she was informed if she needed a stronger prescription that she must return to the ER. Patient states her pain is epigastric and at its worst 6/10 without radiation or transmission. Denies fever or chills. Denies NVD. Denies CP or SOB. Denies ETOH abuse.  The history is provided by the patient. No language interpreter was used.    Past Medical History  Diagnosis Date  . Depression   . Kidney stone   . Depression   . GERD (gastroesophageal reflux disease)   . Kidney stone   . Small bowel obstruction   . Asthma     Past Surgical History  Procedure Date  . Cesarean section     No family history on file.  History  Substance Use Topics  . Smoking status: Never Smoker   . Smokeless tobacco: Never Used  . Alcohol Use: Yes     Comment: rare    OB History    Grav Para Term Preterm Abortions TAB SAB Ect Mult Living                  Review of Systems  Constitutional: Negative for fever and chills.  Respiratory: Negative for shortness of breath.   Cardiovascular: Negative for chest pain.  Gastrointestinal: Positive for abdominal pain. Negative for nausea, vomiting and diarrhea.    Allergies  Review of patient's allergies indicates no known allergies.  Home Medications   Current Outpatient Rx  Name  Route  Sig  Dispense  Refill  .  ACETAMINOPHEN 500 MG PO TABS   Oral   Take 2,000 mg by mouth daily as needed. For pain.         . ALBUTEROL SULFATE HFA 108 (90 BASE) MCG/ACT IN AERS   Inhalation   Inhale 2 puffs into the lungs every 6 (six) hours as needed. For shortness of breath.         . AMPHETAMINE-DEXTROAMPHETAMINE 20 MG PO TABS   Oral   Take 20 mg by mouth 3 (three) times daily as needed. For alertness.         . BUDESONIDE-FORMOTEROL FUMARATE 80-4.5 MCG/ACT IN AERO   Inhalation   Inhale 2 puffs into the lungs 2 (two) times daily.         . BUPROPION HCL ER (XL) 150 MG PO TB24   Oral   Take 150 mg by mouth daily.           Marland Kitchen FLUOXETINE HCL 20 MG PO CAPS   Oral   Take 60 mg by mouth daily.         . IBUPROFEN 200 MG PO TABS   Oral   Take 800 mg by mouth every 8 (eight) hours as needed. For pain         . ONDANSETRON HCL 4 MG PO TABS   Oral   Take 4 mg by mouth every 8 (eight) hours as needed.  For nausea.         Marland Kitchen ONDANSETRON HCL 4 MG PO TABS   Oral   Take 1 tablet (4 mg total) by mouth every 8 (eight) hours as needed for nausea.   10 tablet   0   . OXYCODONE-ACETAMINOPHEN 5-325 MG PO TABS   Oral   Take 1 tablet by mouth every 6 (six) hours as needed for pain.   15 tablet   0   . PANTOPRAZOLE SODIUM 40 MG PO TBEC   Oral   Take 40 mg by mouth daily.         Marland Kitchen RANITIDINE HCL 150 MG PO CAPS   Oral   Take 1 capsule (150 mg total) by mouth daily.   30 capsule   0   . SUCRALFATE 1 GM/10ML PO SUSP   Oral   Take 10 mLs (1 g total) by mouth 4 (four) times daily.   420 mL   0   . TRAMADOL HCL 50 MG PO TABS   Oral   Take 1 tablet (50 mg total) by mouth every 6 (six) hours as needed for pain.   15 tablet   0   . TRAZODONE HCL 50 MG PO TABS   Oral   Take 50 mg by mouth at bedtime.          . VENLAFAXINE HCL ER 37.5 MG PO CP24   Oral   Take 112.5 mg by mouth daily.           BP 131/87  Pulse 86  Temp 98.4 F (36.9 C) (Oral)  Resp 16  SpO2  100%  Physical Exam  Nursing note and vitals reviewed. Constitutional: She appears well-developed and well-nourished.  HENT:  Head: Normocephalic and atraumatic.  Mouth/Throat: Oropharynx is clear and moist.  Eyes: Conjunctivae normal and EOM are normal. No scleral icterus.  Neck: Normal range of motion. Neck supple.  Cardiovascular: Normal rate, regular rhythm and normal heart sounds.   Pulmonary/Chest: Effort normal and breath sounds normal.  Abdominal: Soft. Bowel sounds are normal. She exhibits no distension and no mass. There is tenderness. There is no rebound and no guarding.       Tenderness to palpation of epigastrium.  Neurological: She is alert.  Skin: Skin is warm and dry.    ED Course  Procedures (including critical care time)  Labs Reviewed - No data to display US Abdomen Complete  12/01/2011  *RADIOLOGY REPORT*  Clinical Data:  Abdominal pain for 1 week with worsening last night.  History of kidney stones and gastroesophageal reflux disease.  COMPLETE ABDOMINAL ULTRASOUND  Comparison:  Plain films 12/01/2011.  Abdominal pelvic CT of 11/06/2010.  Prior ultrasound 10/02/2010.  Findings:  Gallbladder:  Minimal gallbladder sludge versus artifact on image 24.  No wall thickening or pericholecystic fluid. Sonographic Murphy's sign was not elicited.  Common bile duct: Normal, 2 mm.  Liver: Increased echogenicity, consistent with steatosis.  Mild sparing adjacent the gallbladder.  IVC: Negative  Pancreas:  Poorly visualized due to overlying bowel gas.  Spleen:  Normal in size and echogenicity.  Right Kidney:  10.9 cm. No hydronephrosis.  Left Kidney:  12.0 cm. No hydronephrosis.  Abdominal aorta:  Poorly visualized due to overlying bowel gas.  No ascites.  Exam overall degraded by overlying bowel gas and patient body habitus.  IMPRESSION:  1. Decreased sensitivity and specificity exam due to technique related factors, as described above. 2.  Given these factors, no explanation for  abdominal  pain or acute process. 3.  Hepatic steatosis. 4.  Possible small amount of gallbladder sludge suspected.   Original Report Authenticated By: Jeronimo Greaves, M.D.    Dg Abd Acute W/chest  12/01/2011  *RADIOLOGY REPORT*  Clinical Data: Upper abdominal pain, nausea, vomiting.  ACUTE ABDOMEN SERIES (ABDOMEN 2 VIEW & CHEST 1 VIEW)  Comparison: CT 11/06/2010.  Findings: IUD noted within the pelvis.  The bowel gas pattern is normal.  There is no evidence of free intraperitoneal air.  No suspicious radio-opaque calculi or other significant radiographic abnormality is seen. Heart size and mediastinal contours are within normal limits.  Both lungs are clear.  IMPRESSION: No acute findings.   Original Report Authenticated By: Charlett Nose, M.D.      1. Abdominal pain, epigastric   2. Gallbladder sludge       MDM  Patient presented with complaint of epigastric pain. Patient given GI cocktail and pain medication with improvement. Patient informed of imaging results. Patient seen by Dr. Nicanor Alcon on 11/30/11 and states she was told she may have an ulcer. Patient has follow-up with GI specialist for EGD this week. Patient discharged with a short course of pain medication, instructions to continue her PPI, and return precautions. No red flags for pancreatitis as recent labs and imaging unremarkable.        Pixie Casino, PA-C 12/02/11 1556

## 2011-12-02 NOTE — ED Provider Notes (Signed)
History     CSN: [id number]  Arrival date & time 12/02/11  2258   First MD Initiated Contact with Patient 12/02/11 2319      Chief Complaint  Patient presents with  . Abdominal Pain    (Consider location/radiation/quality/duration/timing/severity/associated sxs/prior treatment) HPI This is a 31 year old female who has been seen twice in the past 2 days for epigastric pain. The epigastric pain is moderate and sometimes severe. It is described as crampy and is exacerbated by food; it worsens about 20 minutes after eating. It radiates to the right upper quadrant and less so to the left upper quadrant. It has been associated with nausea and vomiting but no diarrhea. She states she has vomited about 7 times today. She tried taking Percocet for the pain without relief. She did not take an antiemetic because she states she wasn't nauseated, just vomiting. She states she feels dehydrated, meaning lightheaded and having a dry mouth. She has not had a fever.  She has had lab work that showed no evidence of abnormal liver functions or pancreatitis. She had a abdominal ultrasound which showed some gallbladder sludge but no gallstones or evidence of cholecystitis. She has an appointment with a gastroenterologist tomorrow.  Past Medical History  Diagnosis Date  . Depression   . Kidney stone   . Depression   . GERD (gastroesophageal reflux disease)   . Kidney stone   . Small bowel obstruction   . Asthma     Past Surgical History  Procedure Date  . Cesarean section     No family history on file.  History  Substance Use Topics  . Smoking status: Never Smoker   . Smokeless tobacco: Never Used  . Alcohol Use: Yes     Comment: rare    OB History    Grav Para Term Preterm Abortions TAB SAB Ect Mult Living                  Review of Systems  All other systems reviewed and are negative.    Allergies  Review of patient's allergies indicates no known allergies.  Home Medications     Current Outpatient Rx  Name  Route  Sig  Dispense  Refill  . ACETAMINOPHEN 500 MG PO TABS   Oral   Take 2,000 mg by mouth daily as needed. For pain.         . ALBUTEROL SULFATE HFA 108 (90 BASE) MCG/ACT IN AERS   Inhalation   Inhale 2 puffs into the lungs every 6 (six) hours as needed. For shortness of breath.         . AMPHETAMINE-DEXTROAMPHETAMINE 20 MG PO TABS   Oral   Take 20 mg by mouth 3 (three) times daily as needed. For alertness.         . BUDESONIDE-FORMOTEROL FUMARATE 80-4.5 MCG/ACT IN AERO   Inhalation   Inhale 2 puffs into the lungs 2 (two) times daily.         . BUPROPION HCL ER (XL) 150 MG PO TB24   Oral   Take 150 mg by mouth daily.           Marland Kitchen FLUOXETINE HCL 20 MG PO CAPS   Oral   Take 60 mg by mouth daily.         . IBUPROFEN 200 MG PO TABS   Oral   Take 800 mg by mouth every 8 (eight) hours as needed. For pain         .  ONDANSETRON HCL 4 MG PO TABS   Oral   Take 4 mg by mouth every 8 (eight) hours as needed. For nausea.         Marland Kitchen ONDANSETRON HCL 4 MG PO TABS   Oral   Take 1 tablet (4 mg total) by mouth every 8 (eight) hours as needed for nausea.   10 tablet   0   . OXYCODONE-ACETAMINOPHEN 5-325 MG PO TABS   Oral   Take 1 tablet by mouth every 6 (six) hours as needed for pain.   15 tablet   0   . PANTOPRAZOLE SODIUM 40 MG PO TBEC   Oral   Take 40 mg by mouth daily.         Marland Kitchen RANITIDINE HCL 150 MG PO CAPS   Oral   Take 1 capsule (150 mg total) by mouth daily.   30 capsule   0   . SUCRALFATE 1 GM/10ML PO SUSP   Oral   Take 10 mLs (1 g total) by mouth 4 (four) times daily.   420 mL   0   . TRAMADOL HCL 50 MG PO TABS   Oral   Take 1 tablet (50 mg total) by mouth every 6 (six) hours as needed for pain.   15 tablet   0   . TRAZODONE HCL 50 MG PO TABS   Oral   Take 50 mg by mouth at bedtime.          . VENLAFAXINE HCL ER 37.5 MG PO CP24   Oral   Take 112.5 mg by mouth daily.           BP 122/57  Pulse  97  Temp 98.4 F (36.9 C) (Oral)  Resp 20  Wt 330 lb (149.687 kg)  SpO2 99%  Physical Exam General: Well-developed, obese female in no acute distress; appearance consistent with age of record HENT: normocephalic, atraumatic Eyes: pupils equal round and reactive to light; extraocular muscles intact Neck: supple Heart: regular rate and rhythm Lungs: clear to auscultation bilaterally Abdomen: soft; nondistended; epigastric and right upper quadrant tenderness; bowel sounds present Extremities: No deformity; full range of motion; pulses normal Neurologic: Awake, alert and oriented; motor function intact in all extremities and symmetric; no facial droop Skin: Warm and dry Psychiatric: Anxious    ED Course  Procedures (including critical care time)  Labs Reviewed - No data to display US Abdomen Complete  12/01/2011  *RADIOLOGY REPORT*  Clinical Data:  Abdominal pain for 1 week with worsening last night.  History of kidney stones and gastroesophageal reflux disease.  COMPLETE ABDOMINAL ULTRASOUND  Comparison:  Plain films 12/01/2011.  Abdominal pelvic CT of 11/06/2010.  Prior ultrasound 10/02/2010.  Findings:  Gallbladder:  Minimal gallbladder sludge versus artifact on image 24.  No wall thickening or pericholecystic fluid. Sonographic Murphy's sign was not elicited.  Common bile duct: Normal, 2 mm.  Liver: Increased echogenicity, consistent with steatosis.  Mild sparing adjacent the gallbladder.  IVC: Negative  Pancreas:  Poorly visualized due to overlying bowel gas.  Spleen:  Normal in size and echogenicity.  Right Kidney:  10.9 cm. No hydronephrosis.  Left Kidney:  12.0 cm. No hydronephrosis.  Abdominal aorta:  Poorly visualized due to overlying bowel gas.  No ascites.  Exam overall degraded by overlying bowel gas and patient body habitus.  IMPRESSION:  1. Decreased sensitivity and specificity exam due to technique related factors, as described above. 2.  Given these factors, no explanation  for abdominal  pain or acute process. 3.  Hepatic steatosis. 4.  Possible small amount of gallbladder sludge suspected.   Original Report Authenticated By: Jeronimo Greaves, M.D.    Dg Abd Acute W/chest  12/01/2011  *RADIOLOGY REPORT*  Clinical Data: Upper abdominal pain, nausea, vomiting.  ACUTE ABDOMEN SERIES (ABDOMEN 2 VIEW & CHEST 1 VIEW)  Comparison: CT 11/06/2010.  Findings: IUD noted within the pelvis.  The bowel gas pattern is normal.  There is no evidence of free intraperitoneal air.  No suspicious radio-opaque calculi or other significant radiographic abnormality is seen. Heart size and mediastinal contours are within normal limits.  Both lungs are clear.  IMPRESSION: No acute findings.   Original Report Authenticated By: Charlett Nose, M.D.       MDM  12:41 AM Pain and nausea controlled with IV medications. Patient also given Carafate. She has an appointment later today with [facility] gastroenterology.         Hanley Seamen, MD 12/03/11 [phone]

## 2011-12-02 NOTE — ED Notes (Signed)
Pt has been seen here the past 2 days for abdominal pain. She sts the pain is across her upper abd and seems to be spreading. Pt has appt with GI tomorrow.

## 2011-12-02 NOTE — ED Provider Notes (Signed)
Medical screening examination/treatment/procedure(s) were performed by non-physician practitioner and as supervising physician I was immediately available for consultation/collaboration.   Rolan Bucco, MD 12/02/11 [phone]

## 2011-12-02 NOTE — ED Notes (Signed)
Family at bedside. 

## 2011-12-02 NOTE — ED Notes (Signed)
Pt. Reports she has been taking her percocet at home with no relief and didn't take her zofran because she didn't feel nauseated just felt pain.

## 2011-12-03 ENCOUNTER — Other Ambulatory Visit (HOSPITAL_COMMUNITY): Payer: Self-pay | Admitting: Gastroenterology

## 2011-12-03 DIAGNOSIS — R109 Unspecified abdominal pain: Secondary | ICD-10-CM

## 2011-12-03 NOTE — ED Notes (Signed)
Pt. Resting with eyes closed with no c/o pain at this time.

## 2011-12-04 ENCOUNTER — Encounter (INDEPENDENT_AMBULATORY_CARE_PROVIDER_SITE_OTHER): Payer: Self-pay | Admitting: Surgery

## 2011-12-05 ENCOUNTER — Encounter (INDEPENDENT_AMBULATORY_CARE_PROVIDER_SITE_OTHER): Payer: Self-pay | Admitting: Surgery

## 2011-12-05 ENCOUNTER — Ambulatory Visit (INDEPENDENT_AMBULATORY_CARE_PROVIDER_SITE_OTHER): Payer: Managed Care, Other (non HMO) | Admitting: Surgery

## 2011-12-05 VITALS — BP 124/82 | HR 68 | Temp 99.4°F | Resp 16 | Ht 64.0 in | Wt 337.0 lb

## 2011-12-05 DIAGNOSIS — K811 Chronic cholecystitis: Secondary | ICD-10-CM

## 2011-12-05 NOTE — Progress Notes (Signed)
Patient ID: Claudia Walsh, female   DOB: 06/14/1980, 30 y.o.   MRN: [id number]  Chief Complaint  Patient presents with  . New Evaluation    eval gallbladder    HPI Claudia Walsh is a 30 y.o. female.  Referred by Dr. John Hayes for evaluation of gallbladder disease HPI This is a 30 year-old female who presents with 2 weeks of intermittent epigastric pain, radiating around the right side into her back.  This has become worse over the last few days, especially after eating. She has had some associated nausea, vomiting, diarrhea. She underwent EGD by Dr. Hayes which was essentially normal.  Ultrasound showed sludge.  LFT's were WNL.  She presents now for surgical evaluation.  Past Medical History  Diagnosis Date  . Depression   . Kidney stone   . Depression   . GERD (gastroesophageal reflux disease)   . Kidney stone   . Small bowel obstruction   . Asthma   . Anxiety     Past Surgical History  Procedure Date  . Cesarean section   . Wisdom tooth extraction   . Tympanostomy tube placement     No family history on file.  Social History History  Substance Use Topics  . Smoking status: Never Smoker   . Smokeless tobacco: Never Used  . Alcohol Use: Yes     Comment: rare    Allergies  Allergen Reactions  . Depo-Provera (Medroxyprogesterone)   . Nuvaring (Etonogestrel-Ethinyl Estradiol)     Rash on face,mood swings  . Ortho-Cyclen (Norgestimate-Eth Estradiol)     Current Outpatient Prescriptions  Medication Sig Dispense Refill  . acetaminophen (TYLENOL) 500 MG tablet Take 2,000 mg by mouth daily as needed. For pain.      . albuterol (PROVENTIL HFA;VENTOLIN HFA) 108 (90 BASE) MCG/ACT inhaler Inhale 2 puffs into the lungs every 6 (six) hours as needed. For shortness of breath.      . budesonide-formoterol (SYMBICORT) 80-4.5 MCG/ACT inhaler Inhale 2 puffs into the lungs 2 (two) times daily.      . ibuprofen (ADVIL,MOTRIN) 200 MG tablet Take 800 mg by mouth every 8  (eight) hours as needed. For pain      . ondansetron (ZOFRAN) 4 MG tablet Take 4 mg by mouth every 8 (eight) hours as needed. For nausea.      . oxyCODONE-acetaminophen (PERCOCET/ROXICET) 5-325 MG per tablet Take 1 tablet by mouth every 6 (six) hours as needed for pain.  15 tablet  0  . pantoprazole (PROTONIX) 40 MG tablet Take 40 mg by mouth daily.      . ranitidine (ZANTAC) 150 MG capsule Take 1 capsule (150 mg total) by mouth daily.  30 capsule  0  . amphetamine-dextroamphetamine (ADDERALL) 20 MG tablet Take 20 mg by mouth 3 (three) times daily as needed. For alertness.      . buPROPion (WELLBUTRIN XL) 150 MG 24 hr tablet Take 150 mg by mouth daily.        . FLUoxetine (PROZAC) 20 MG capsule Take 60 mg by mouth daily.      . ondansetron (ZOFRAN) 4 MG tablet Take 1 tablet (4 mg total) by mouth every 8 (eight) hours as needed for nausea.  10 tablet  0  . sucralfate (CARAFATE) 1 GM/10ML suspension Take 10 mLs (1 g total) by mouth 4 (four) times daily.  420 mL  0  . traMADol (ULTRAM) 50 MG tablet Take 1 tablet (50 mg total) by mouth every 6 (six) hours as   needed for pain.  15 tablet  0  . traZODone (DESYREL) 50 MG tablet Take 50 mg by mouth at bedtime.       . venlafaxine XR (EFFEXOR-XR) 37.5 MG 24 hr capsule Take 112.5 mg by mouth daily.        Review of Systems Review of Systems  Constitutional: Negative for fever, chills and unexpected weight change.  HENT: Negative for hearing loss, congestion, sore throat, trouble swallowing and voice change.   Eyes: Negative for visual disturbance.  Respiratory: Negative for cough and wheezing.   Cardiovascular: Negative for chest pain, palpitations and leg swelling.  Gastrointestinal: Positive for nausea, vomiting, abdominal pain, diarrhea and abdominal distention. Negative for constipation, blood in stool and anal bleeding.  Genitourinary: Negative for hematuria, vaginal bleeding and difficulty urinating.  Musculoskeletal: Negative for arthralgias.    Skin: Negative for rash and wound.  Neurological: Negative for seizures, syncope and headaches.  Hematological: Negative for adenopathy. Does not bruise/bleed easily.  Psychiatric/Behavioral: Negative for confusion.    Blood pressure 124/82, pulse 68, temperature 99.4 F (37.4 C), resp. rate 16, height 5' 4" (1.626 m), weight 337 lb (152.862 kg).  Physical Exam Physical Exam WDWN in NAD HEENT:  EOMI, sclera anicteric Neck:  No masses, no thyromegaly Lungs:  CTA bilaterally; normal respiratory effort CV:  Regular rate and rhythm; no murmurs Abd:  +bowel sounds, obese, soft, non-tender Ext:  Well-perfused; no edema Skin:  Warm, dry; no sign of jaundice  Data Reviewed Lab Results  Component Value Date   WBC 9.4 11/30/2011   HGB 11.0* 11/30/2011   HCT 34.3* 11/30/2011   MCV 78.5 11/30/2011   PLT 269 11/30/2011   Lab Results  Component Value Date   ALT 23 11/30/2011   AST 15 11/30/2011   ALKPHOS 104 11/30/2011   BILITOT 0.2* 11/30/2011   Lab Results  Component Value Date   CREATININE 0.60 11/30/2011   BUN 16 11/30/2011   NA 140 11/30/2011   K 4.1 11/30/2011   CL 104 11/30/2011   CO2 24 11/30/2011   *RADIOLOGY REPORT*  Clinical Data: Abdominal pain for 1 week with worsening last  night. History of kidney stones and gastroesophageal reflux  disease.  COMPLETE ABDOMINAL ULTRASOUND  Comparison: Plain films 12/01/2011. Abdominal pelvic CT of  11/06/2010. Prior ultrasound 10/02/2010.  Findings:  Gallbladder: Minimal gallbladder sludge versus artifact on image  24. No wall thickening or pericholecystic fluid. Sonographic  Murphy's sign was not elicited.  Common bile duct: Normal, 2 mm.  Liver: Increased echogenicity, consistent with steatosis. Mild  sparing adjacent the gallbladder.  IVC: Negative  Pancreas: Poorly visualized due to overlying bowel gas.  Spleen: Normal in size and echogenicity.  Right Kidney: 10.9 cm. No hydronephrosis.  Left Kidney: 12.0 cm. No  hydronephrosis.  Abdominal aorta: Poorly visualized due to overlying bowel gas. No  ascites.  Exam overall degraded by overlying bowel gas and patient body  habitus.  IMPRESSION:  1. Decreased sensitivity and specificity exam due to technique  related factors, as described above.  2. Given these factors, no explanation for abdominal pain or acute  process.  3. Hepatic steatosis.  4. Possible small amount of gallbladder sludge suspected.  Original Report Authenticated By: Kyle Talbot, M.D.         Assessment    Chronic cholecystitis secondary to biliary sludge    Plan    Laparoscopic cholecystectomy with intraoperative cholangiogram.  The surgical procedure has been discussed with the patient.  Potential risks, benefits, alternative   treatments, and expected outcomes have been explained.  All of the patient's questions at this time have been answered.  The likelihood of reaching the patient's treatment goal is good.  The patient understand the proposed surgical procedure and wishes to proceed.        Sebert Stollings K. 12/05/2011, 11:46 AM    

## 2011-12-08 DIAGNOSIS — R058 Other specified cough: Secondary | ICD-10-CM

## 2011-12-08 HISTORY — DX: Other specified cough: R05.8

## 2011-12-09 ENCOUNTER — Encounter (HOSPITAL_COMMUNITY): Payer: Self-pay | Admitting: Pharmacy Technician

## 2011-12-11 ENCOUNTER — Encounter (HOSPITAL_COMMUNITY)
Admission: RE | Admit: 2011-12-11 | Discharge: 2011-12-11 | Disposition: A | Discharge status: Home / Self Care | Payer: Managed Care, Other (non HMO) | Source: Ambulatory Visit | Attending: Surgery | Admitting: Surgery

## 2011-12-11 ENCOUNTER — Encounter (HOSPITAL_COMMUNITY): Payer: Self-pay

## 2011-12-11 ENCOUNTER — Ambulatory Visit (HOSPITAL_COMMUNITY)
Admission: RE | Admit: 2011-12-11 | Discharge: 2011-12-11 | Disposition: A | Discharge status: Home / Self Care | Payer: Managed Care, Other (non HMO) | Source: Ambulatory Visit | Attending: Surgery | Admitting: Surgery

## 2011-12-11 DIAGNOSIS — Z01818 Encounter for other preprocedural examination: Secondary | ICD-10-CM | POA: Insufficient documentation

## 2011-12-11 DIAGNOSIS — R059 Cough, unspecified: Secondary | ICD-10-CM | POA: Insufficient documentation

## 2011-12-11 DIAGNOSIS — R05 Cough: Secondary | ICD-10-CM | POA: Insufficient documentation

## 2011-12-11 DIAGNOSIS — J45909 Unspecified asthma, uncomplicated: Secondary | ICD-10-CM | POA: Insufficient documentation

## 2011-12-11 DIAGNOSIS — Z01812 Encounter for preprocedural laboratory examination: Secondary | ICD-10-CM | POA: Insufficient documentation

## 2011-12-11 HISTORY — DX: Personal history of other diseases of the digestive system: Z87.19

## 2011-12-11 HISTORY — DX: Cough: R05

## 2011-12-11 LAB — CBC
HCT: 37.4 % (ref 36.0–46.0)
MCH: 25.5 pg — ABNORMAL LOW (ref 26.0–34.0)
MCHC: 32.9 g/dL (ref 30.0–36.0)
MCV: 77.4 fL — ABNORMAL LOW (ref 78.0–100.0)
RDW: 14.7 % (ref 11.5–15.5)

## 2011-12-11 LAB — HCG, SERUM, QUALITATIVE: Preg, Serum: NEGATIVE

## 2011-12-11 NOTE — Progress Notes (Signed)
CBC, CMET, 11/30/11 EPIC, 1 view chest 12/01/11 EPIC

## 2011-12-11 NOTE — Patient Instructions (Addendum)
20 Shekela LARUA PEARCY  12/11/2011   Your procedure is scheduled on:  12/12/11   Friday   Surgery 0900-1030  Report to [facility] at  0630     AM.  Call this number if you have problems the morning of surgery: [phone]        Remember:   Do not eat food  Or drink :After Midnight.  TONIGHT-  Thursday NIGHT   Take these medicines the morning of surgery with A SIP OF WATER:      PERCOCET IF NEEDED     ALBUTEROL AND OR SYMBICORT IF NEEDED BRING INHALERS WITH YOU TO HOSPITAL  .  Contacts, dentures or partial plates can not be worn to surgery  Leave suitcase in the car. After surgery it may be brought to your room.  For patients admitted to the hospital, checkout time is 11:00 AM day of  discharge.             SPECIAL INSTRUCTIONS- SEE [place] PREPARING FOR SURGERY INSTRUCTION SHEET-     DO NOT WEAR JEWELRY, LOTIONS, POWDERS, OR PERFUMES.  WOMEN-- DO NOT SHAVE LEGS OR UNDERARMS FOR 12 HOURS BEFORE SHOWERS. MEN MAY SHAVE FACE.  Patients discharged the day of surgery will not be allowed to drive home. IF going home the day of surgery, you must have a driver and someone to stay with you for the first 24 hours  Name and phone number of your driver: mother in law-  AMELIA                                                                       Please read over the following fact sheets that you were given: MRSA Information, Incentive Spirometry Sheet, Blood Transfusion Sheet  Information                                                                                   Andriel Omalley  PST [phone]

## 2011-12-11 NOTE — Progress Notes (Signed)
Dr Corliss Skains-    I saw Claudia Walsh this PM  in PST at [facility]. States has new onset of URI with small amt green productive sputum in the mornings, nasal fullness, and clear nasal drainage.  CBC is pending- chest x ray is clear. States no fever. Just wanted you to know. Thanks

## 2011-12-12 ENCOUNTER — Ambulatory Visit (HOSPITAL_COMMUNITY)
Admission: RE | Admit: 2011-12-12 | Discharge: 2011-12-12 | Disposition: A | Discharge status: Home / Self Care | Payer: Managed Care, Other (non HMO) | Source: Ambulatory Visit | Attending: Surgery | Admitting: Surgery

## 2011-12-12 ENCOUNTER — Ambulatory Visit (HOSPITAL_COMMUNITY): Payer: Managed Care, Other (non HMO)

## 2011-12-12 ENCOUNTER — Encounter (HOSPITAL_COMMUNITY): Payer: Self-pay | Admitting: Anesthesiology

## 2011-12-12 ENCOUNTER — Encounter (HOSPITAL_COMMUNITY)
Admission: RE | Disposition: A | Discharge status: Home / Self Care | Payer: Self-pay | Source: Ambulatory Visit | Attending: Surgery

## 2011-12-12 ENCOUNTER — Ambulatory Visit (HOSPITAL_COMMUNITY): Payer: Managed Care, Other (non HMO) | Admitting: Anesthesiology

## 2011-12-12 ENCOUNTER — Encounter (HOSPITAL_COMMUNITY): Payer: Self-pay | Admitting: *Deleted

## 2011-12-12 DIAGNOSIS — K838 Other specified diseases of biliary tract: Secondary | ICD-10-CM | POA: Insufficient documentation

## 2011-12-12 DIAGNOSIS — Z01812 Encounter for preprocedural laboratory examination: Secondary | ICD-10-CM | POA: Insufficient documentation

## 2011-12-12 DIAGNOSIS — K219 Gastro-esophageal reflux disease without esophagitis: Secondary | ICD-10-CM | POA: Insufficient documentation

## 2011-12-12 DIAGNOSIS — Z79899 Other long term (current) drug therapy: Secondary | ICD-10-CM | POA: Insufficient documentation

## 2011-12-12 DIAGNOSIS — K811 Chronic cholecystitis: Secondary | ICD-10-CM

## 2011-12-12 DIAGNOSIS — J45909 Unspecified asthma, uncomplicated: Secondary | ICD-10-CM | POA: Insufficient documentation

## 2011-12-12 HISTORY — PX: CHOLECYSTECTOMY: SHX55

## 2011-12-12 SURGERY — LAPAROSCOPIC CHOLECYSTECTOMY WITH INTRAOPERATIVE CHOLANGIOGRAM
Anesthesia: General | Site: Abdomen | Wound class: Clean Contaminated

## 2011-12-12 MED ORDER — DEXAMETHASONE SODIUM PHOSPHATE 10 MG/ML IJ SOLN
INTRAMUSCULAR | Status: DC | PRN
  Administered 2011-12-12: 10 mg via INTRAVENOUS

## 2011-12-12 MED ORDER — HYDROMORPHONE HCL PF 1 MG/ML IJ SOLN
INTRAMUSCULAR | Status: AC
  Filled 2011-12-12: qty 1

## 2011-12-12 MED ORDER — MUPIROCIN 2 % EX OINT
TOPICAL_OINTMENT | Freq: Two times a day (BID) | CUTANEOUS | Status: DC
  Administered 2011-12-12: 1 via NASAL
  Filled 2011-12-12 (×2): qty 22

## 2011-12-12 MED ORDER — MIDAZOLAM HCL 5 MG/5ML IJ SOLN
INTRAMUSCULAR | Status: DC | PRN
  Administered 2011-12-12: 2 mg via INTRAVENOUS

## 2011-12-12 MED ORDER — OXYCODONE-ACETAMINOPHEN 5-325 MG PO TABS: 1.0000 | ORAL_TABLET | ORAL | Status: DC | PRN

## 2011-12-12 MED ORDER — GLYCOPYRROLATE 0.2 MG/ML IJ SOLN
INTRAMUSCULAR | Status: DC | PRN
  Administered 2011-12-12: 0.6 mg via INTRAVENOUS
  Administered 2011-12-12: 0.2 mg via INTRAVENOUS

## 2011-12-12 MED ORDER — CEFAZOLIN SODIUM-DEXTROSE 2-3 GM-% IV SOLR
INTRAVENOUS | Status: AC
  Filled 2011-12-12: qty 50

## 2011-12-12 MED ORDER — BUPIVACAINE-EPINEPHRINE 0.25% -1:200000 IJ SOLN
INTRAMUSCULAR | Status: DC | PRN
  Administered 2011-12-12: 24 mL

## 2011-12-12 MED ORDER — HYDROMORPHONE HCL PF 1 MG/ML IJ SOLN
0.2500 mg | INTRAMUSCULAR | Status: DC | PRN
  Administered 2011-12-12 (×4): 0.5 mg via INTRAVENOUS

## 2011-12-12 MED ORDER — HYDROMORPHONE HCL PF 1 MG/ML IJ SOLN: 1.0000 mg | INTRAMUSCULAR | Status: DC | PRN

## 2011-12-12 MED ORDER — PROPOFOL 10 MG/ML IV BOLUS
INTRAVENOUS | Status: DC | PRN
  Administered 2011-12-12: 200 mg via INTRAVENOUS
  Administered 2011-12-12: 30 mg via INTRAVENOUS

## 2011-12-12 MED ORDER — 0.9 % SODIUM CHLORIDE (POUR BTL) OPTIME
TOPICAL | Status: DC | PRN
  Administered 2011-12-12: 1000 mL

## 2011-12-12 MED ORDER — ONDANSETRON HCL 4 MG/2ML IJ SOLN
INTRAMUSCULAR | Status: DC | PRN
  Administered 2011-12-12: 4 mg via INTRAVENOUS

## 2011-12-12 MED ORDER — ACETAMINOPHEN 10 MG/ML IV SOLN
INTRAVENOUS | Status: DC | PRN
  Administered 2011-12-12: 1000 mg via INTRAVENOUS

## 2011-12-12 MED ORDER — FENTANYL CITRATE 0.05 MG/ML IJ SOLN
INTRAMUSCULAR | Status: DC | PRN
  Administered 2011-12-12 (×5): 50 ug via INTRAVENOUS
  Administered 2011-12-12: 100 ug via INTRAVENOUS

## 2011-12-12 MED ORDER — CEFAZOLIN SODIUM 1-5 GM-% IV SOLN
INTRAVENOUS | Status: AC
  Filled 2011-12-12: qty 50

## 2011-12-12 MED ORDER — LACTATED RINGERS IV SOLN
INTRAVENOUS | Status: DC
  Administered 2011-12-12: 1000 mL via INTRAVENOUS
  Administered 2011-12-12 (×2): via INTRAVENOUS

## 2011-12-12 MED ORDER — ROCURONIUM BROMIDE 100 MG/10ML IV SOLN
INTRAVENOUS | Status: DC | PRN
  Administered 2011-12-12: 30 mg via INTRAVENOUS

## 2011-12-12 MED ORDER — LACTATED RINGERS IV SOLN
INTRAVENOUS | Status: DC | PRN
  Administered 2011-12-12: 1000 mL

## 2011-12-12 MED ORDER — IOHEXOL 300 MG/ML  SOLN
INTRAMUSCULAR | Status: AC
  Filled 2011-12-12: qty 1

## 2011-12-12 MED ORDER — NEOSTIGMINE METHYLSULFATE 1 MG/ML IJ SOLN
INTRAMUSCULAR | Status: DC | PRN
  Administered 2011-12-12: 5 mg via INTRAVENOUS

## 2011-12-12 MED ORDER — LIDOCAINE HCL (CARDIAC) 20 MG/ML IV SOLN
INTRAVENOUS | Status: DC | PRN
  Administered 2011-12-12: 50 mg via INTRAVENOUS

## 2011-12-12 MED ORDER — SUCCINYLCHOLINE CHLORIDE 20 MG/ML IJ SOLN
INTRAMUSCULAR | Status: DC | PRN
  Administered 2011-12-12: 100 mg via INTRAVENOUS

## 2011-12-12 MED ORDER — BUPIVACAINE-EPINEPHRINE PF 0.25-1:200000 % IJ SOLN
INTRAMUSCULAR | Status: AC
  Filled 2011-12-12: qty 30

## 2011-12-12 MED ORDER — ONDANSETRON HCL 4 MG/2ML IJ SOLN: 4.0000 mg | INTRAMUSCULAR | Status: DC | PRN

## 2011-12-12 MED ORDER — ACETAMINOPHEN 10 MG/ML IV SOLN
INTRAVENOUS | Status: AC
  Filled 2011-12-12: qty 100

## 2011-12-12 MED ORDER — LACTATED RINGERS IV SOLN: INTRAVENOUS | Status: DC

## 2011-12-12 MED ORDER — DEXTROSE 5 % IV SOLN
3.0000 g | INTRAVENOUS | Status: AC
  Administered 2011-12-12: 3 g via INTRAVENOUS

## 2011-12-12 MED ORDER — SODIUM CHLORIDE 0.9 % IV SOLN
INTRAVENOUS | Status: DC | PRN
  Administered 2011-12-12: 09:00:00

## 2011-12-12 SURGICAL SUPPLY — 42 items
APPLIER CLIP ROT 10 11.4 M/L (STAPLE) ×2
BENZOIN TINCTURE PRP APPL 2/3 (GAUZE/BANDAGES/DRESSINGS) ×2 IMPLANT
CANISTER SUCTION 2500CC (MISCELLANEOUS) ×2 IMPLANT
CHLORAPREP W/TINT 26ML (MISCELLANEOUS) ×2 IMPLANT
CLIP APPLIE ROT 10 11.4 M/L (STAPLE) ×1 IMPLANT
CLOTH BEACON ORANGE TIMEOUT ST (SAFETY) ×2 IMPLANT
COVER MAYO STAND STRL (DRAPES) ×2 IMPLANT
DECANTER SPIKE VIAL GLASS SM (MISCELLANEOUS) ×2 IMPLANT
DRAPE C-ARM 42X72 X-RAY (DRAPES) ×2 IMPLANT
DRAPE LAPAROSCOPIC ABDOMINAL (DRAPES) ×2 IMPLANT
DRAPE UTILITY XL STRL (DRAPES) ×2 IMPLANT
DRSG TEGADERM 2-3/8X2-3/4 SM (GAUZE/BANDAGES/DRESSINGS) ×2 IMPLANT
DRSG TEGADERM 4X4.75 (GAUZE/BANDAGES/DRESSINGS) ×2 IMPLANT
ELECT REM PT RETURN 9FT ADLT (ELECTROSURGICAL) ×2
ELECTRODE REM PT RTRN 9FT ADLT (ELECTROSURGICAL) ×1 IMPLANT
FILTER SMOKE EVAC LAPAROSHD (FILTER) ×2 IMPLANT
GAUZE SPONGE 2X2 8PLY STRL LF (GAUZE/BANDAGES/DRESSINGS) ×1 IMPLANT
GLOVE BIO SURGEON STRL SZ7 (GLOVE) ×2 IMPLANT
GLOVE BIOGEL PI IND STRL 7.0 (GLOVE) ×1 IMPLANT
GLOVE BIOGEL PI IND STRL 7.5 (GLOVE) ×1 IMPLANT
GLOVE BIOGEL PI INDICATOR 7.0 (GLOVE) ×1
GLOVE BIOGEL PI INDICATOR 7.5 (GLOVE) ×1
GOWN STRL NON-REIN LRG LVL3 (GOWN DISPOSABLE) ×4 IMPLANT
GOWN STRL REIN XL XLG (GOWN DISPOSABLE) ×2 IMPLANT
KIT BASIN OR (CUSTOM PROCEDURE TRAY) ×2 IMPLANT
NS IRRIG 1000ML POUR BTL (IV SOLUTION) ×2 IMPLANT
POUCH SPECIMEN RETRIEVAL 10MM (ENDOMECHANICALS) ×2 IMPLANT
RINGERS IRRIG 1000ML POUR BTL (IV SOLUTION) ×2 IMPLANT
SET CHOLANGIOGRAPH MIX (MISCELLANEOUS) ×2 IMPLANT
SET IRRIG TUBING LAPAROSCOPIC (IRRIGATION / IRRIGATOR) ×2 IMPLANT
SLEEVE XCEL OPT CAN 5 100 (ENDOMECHANICALS) ×2 IMPLANT
SOLUTION ANTI FOG 6CC (MISCELLANEOUS) ×2 IMPLANT
SPONGE GAUZE 2X2 STER 10/PKG (GAUZE/BANDAGES/DRESSINGS) ×1
STRIP CLOSURE SKIN 1/2X4 (GAUZE/BANDAGES/DRESSINGS) ×2 IMPLANT
SUT MNCRL AB 4-0 PS2 18 (SUTURE) ×2 IMPLANT
TOWEL OR 17X26 10 PK STRL BLUE (TOWEL DISPOSABLE) ×2 IMPLANT
TRAY LAP CHOLE (CUSTOM PROCEDURE TRAY) ×2 IMPLANT
TROCAR BLADELESS OPT 5 100 (ENDOMECHANICALS) ×2 IMPLANT
TROCAR BLADELESS OPT 5 75 (ENDOMECHANICALS) ×2 IMPLANT
TROCAR XCEL BLUNT TIP 100MML (ENDOMECHANICALS) ×2 IMPLANT
TROCAR XCEL NON-BLD 11X100MML (ENDOMECHANICALS) ×2 IMPLANT
TUBING INSUFFLATION 10FT LAP (TUBING) ×2 IMPLANT

## 2011-12-12 NOTE — Anesthesia Postprocedure Evaluation (Signed)
  Anesthesia Post-op Note  Patient: Claudia Walsh  Procedure(s) Performed: Procedure(s) (LRB): LAPAROSCOPIC CHOLECYSTECTOMY WITH INTRAOPERATIVE CHOLANGIOGRAM (N/A)  Patient Location: PACU  Anesthesia Type: General  Level of Consciousness: awake and alert   Airway and Oxygen Therapy: Patient Spontanous Breathing  Post-op Pain: mild  Post-op Assessment: Post-op Vital signs reviewed, Patient's Cardiovascular Status Stable, Respiratory Function Stable, Patent Airway and No signs of Nausea or vomiting  Last Vitals:  Filed Vitals:   12/12/11 1047  BP: 139/61  Pulse:   Temp: 36.4 C  Resp:     Post-op Vital Signs: stable   Complications: No apparent anesthesia complications

## 2011-12-12 NOTE — H&P (View-Only) (Signed)
Patient ID: Claudia Walsh, female   DOB: 07/02/1980, 31 y.o.   MRN: [id number]  Chief Complaint  Patient presents with  . New Evaluation    eval gallbladder    HPI Claudia Walsh is a 31 y.o. female.  Referred by Dr. Dorena Cookey for evaluation of gallbladder disease HPI This is a 31 year-old female who presents with 2 weeks of intermittent epigastric pain, radiating around the right side into her back.  This has become worse over the last few days, especially after eating. She has had some associated nausea, vomiting, diarrhea. She underwent EGD by Dr. Madilyn Fireman which was essentially normal.  Ultrasound showed sludge.  LFT's were WNL.  She presents now for surgical evaluation.  Past Medical History  Diagnosis Date  . Depression   . Kidney stone   . Depression   . GERD (gastroesophageal reflux disease)   . Kidney stone   . Small bowel obstruction   . Asthma   . Anxiety     Past Surgical History  Procedure Date  . Cesarean section   . Wisdom tooth extraction   . Tympanostomy tube placement     No family history on file.  Social History History  Substance Use Topics  . Smoking status: Never Smoker   . Smokeless tobacco: Never Used  . Alcohol Use: Yes     Comment: rare    Allergies  Allergen Reactions  . Depo-Provera (Medroxyprogesterone)   . Nuvaring (Etonogestrel-Ethinyl Estradiol)     Rash on face,mood swings  . Ortho-Cyclen (Norgestimate-Eth Estradiol)     Current Outpatient Prescriptions  Medication Sig Dispense Refill  . acetaminophen (TYLENOL) 500 MG tablet Take 2,000 mg by mouth daily as needed. For pain.      Marland Kitchen albuterol (PROVENTIL HFA;VENTOLIN HFA) 108 (90 BASE) MCG/ACT inhaler Inhale 2 puffs into the lungs every 6 (six) hours as needed. For shortness of breath.      . budesonide-formoterol (SYMBICORT) 80-4.5 MCG/ACT inhaler Inhale 2 puffs into the lungs 2 (two) times daily.      Marland Kitchen ibuprofen (ADVIL,MOTRIN) 200 MG tablet Take 800 mg by mouth every 8  (eight) hours as needed. For pain      . ondansetron (ZOFRAN) 4 MG tablet Take 4 mg by mouth every 8 (eight) hours as needed. For nausea.      Marland Kitchen oxyCODONE-acetaminophen (PERCOCET/ROXICET) 5-325 MG per tablet Take 1 tablet by mouth every 6 (six) hours as needed for pain.  15 tablet  0  . pantoprazole (PROTONIX) 40 MG tablet Take 40 mg by mouth daily.      . ranitidine (ZANTAC) 150 MG capsule Take 1 capsule (150 mg total) by mouth daily.  30 capsule  0  . amphetamine-dextroamphetamine (ADDERALL) 20 MG tablet Take 20 mg by mouth 3 (three) times daily as needed. For alertness.      Marland Kitchen buPROPion (WELLBUTRIN XL) 150 MG 24 hr tablet Take 150 mg by mouth daily.        Marland Kitchen FLUoxetine (PROZAC) 20 MG capsule Take 60 mg by mouth daily.      . ondansetron (ZOFRAN) 4 MG tablet Take 1 tablet (4 mg total) by mouth every 8 (eight) hours as needed for nausea.  10 tablet  0  . sucralfate (CARAFATE) 1 GM/10ML suspension Take 10 mLs (1 g total) by mouth 4 (four) times daily.  420 mL  0  . traMADol (ULTRAM) 50 MG tablet Take 1 tablet (50 mg total) by mouth every 6 (six) hours as  needed for pain.  15 tablet  0  . traZODone (DESYREL) 50 MG tablet Take 50 mg by mouth at bedtime.       Marland Kitchen venlafaxine XR (EFFEXOR-XR) 37.5 MG 24 hr capsule Take 112.5 mg by mouth daily.        Review of Systems Review of Systems  Constitutional: Negative for fever, chills and unexpected weight change.  HENT: Negative for hearing loss, congestion, sore throat, trouble swallowing and voice change.   Eyes: Negative for visual disturbance.  Respiratory: Negative for cough and wheezing.   Cardiovascular: Negative for chest pain, palpitations and leg swelling.  Gastrointestinal: Positive for nausea, vomiting, abdominal pain, diarrhea and abdominal distention. Negative for constipation, blood in stool and anal bleeding.  Genitourinary: Negative for hematuria, vaginal bleeding and difficulty urinating.  Musculoskeletal: Negative for arthralgias.    Skin: Negative for rash and wound.  Neurological: Negative for seizures, syncope and headaches.  Hematological: Negative for adenopathy. Does not bruise/bleed easily.  Psychiatric/Behavioral: Negative for confusion.    Blood pressure 124/82, pulse 68, temperature 99.4 F (37.4 C), resp. rate 16, height 5\' 4"  (1.626 m), weight 337 lb (152.862 kg).  Physical Exam Physical Exam WDWN in NAD HEENT:  EOMI, sclera anicteric Neck:  No masses, no thyromegaly Lungs:  CTA bilaterally; normal respiratory effort CV:  Regular rate and rhythm; no murmurs Abd:  +bowel sounds, obese, soft, non-tender Ext:  Well-perfused; no edema Skin:  Warm, dry; no sign of jaundice  Data Reviewed Lab Results  Component Value Date   WBC 9.4 11/30/2011   HGB 11.0* 11/30/2011   HCT 34.3* 11/30/2011   MCV 78.5 11/30/2011   PLT 269 11/30/2011   Lab Results  Component Value Date   ALT 23 11/30/2011   AST 15 11/30/2011   ALKPHOS 104 11/30/2011   BILITOT 0.2* 11/30/2011   Lab Results  Component Value Date   CREATININE 0.60 11/30/2011   BUN 16 11/30/2011   NA 140 11/30/2011   K 4.1 11/30/2011   CL 104 11/30/2011   CO2 24 11/30/2011   *RADIOLOGY REPORT*  Clinical Data: Abdominal pain for 1 week with worsening last  night. History of kidney stones and gastroesophageal reflux  disease.  COMPLETE ABDOMINAL ULTRASOUND  Comparison: Plain films 12/01/2011. Abdominal pelvic CT of  11/06/2010. Prior ultrasound 10/02/2010.  Findings:  Gallbladder: Minimal gallbladder sludge versus artifact on image  24. No wall thickening or pericholecystic fluid. Sonographic  Murphy's sign was not elicited.  Common bile duct: Normal, 2 mm.  Liver: Increased echogenicity, consistent with steatosis. Mild  sparing adjacent the gallbladder.  IVC: Negative  Pancreas: Poorly visualized due to overlying bowel gas.  Spleen: Normal in size and echogenicity.  Right Kidney: 10.9 cm. No hydronephrosis.  Left Kidney: 12.0 cm. No  hydronephrosis.  Abdominal aorta: Poorly visualized due to overlying bowel gas. No  ascites.  Exam overall degraded by overlying bowel gas and patient body  habitus.  IMPRESSION:  1. Decreased sensitivity and specificity exam due to technique  related factors, as described above.  2. Given these factors, no explanation for abdominal pain or acute  process.  3. Hepatic steatosis.  4. Possible small amount of gallbladder sludge suspected.  Original Report Authenticated By: Jeronimo Greaves, M.D.         Assessment    Chronic cholecystitis secondary to biliary sludge    Plan    Laparoscopic cholecystectomy with intraoperative cholangiogram.  The surgical procedure has been discussed with the patient.  Potential risks, benefits, alternative  treatments, and expected outcomes have been explained.  All of the patient's questions at this time have been answered.  The likelihood of reaching the patient's treatment goal is good.  The patient understand the proposed surgical procedure and wishes to proceed.        Eban Weick K. 12/05/2011, 11:46 AM

## 2011-12-12 NOTE — Transfer of Care (Signed)
Immediate Anesthesia Transfer of Care Note  Patient: Claudia Walsh  Procedure(s) Performed: Procedure(s) (LRB) with comments: LAPAROSCOPIC CHOLECYSTECTOMY WITH INTRAOPERATIVE CHOLANGIOGRAM (N/A)  Patient Location: PACU  Anesthesia Type:General  Level of Consciousness: sedated  Airway & Oxygen Therapy: Patient Spontanous Breathing and Patient connected to face mask oxygen  Post-op Assessment: Report given to PACU RN and Post -op Vital signs reviewed and stable  Post vital signs: Reviewed and stable  Complications: No apparent anesthesia complications

## 2011-12-12 NOTE — Op Note (Signed)
Laparoscopic Cholecystectomy with IOC Procedure Note  Indications: This patient presents with symptomatic gallbladder disease and will undergo laparoscopic cholecystectomy.  Pre-operative Diagnosis: Chronic cholecystitis  Post-operative Diagnosis: Chronic cholecystitis  Surgeon: Hayla Hinger K.   Assistants: Romie Levee, MD  Anesthesia: General endotracheal anesthesia  ASA Class: 2  Procedure Details  The patient was seen again in the Holding Room. The risks, benefits, complications, treatment options, and expected outcomes were discussed with the patient. The possibilities of reaction to medication, pulmonary aspiration, perforation of viscus, bleeding, recurrent infection, finding a normal gallbladder, the need for additional procedures, failure to diagnose a condition, the possible need to convert to an open procedure, and creating a complication requiring transfusion or operation were discussed with the patient. The likelihood of improving the patient's symptoms with return to their baseline status is good.  The patient and/or family concurred with the proposed plan, giving informed consent. The site of surgery properly noted. The patient was taken to Operating Room, identified as EILIDH DIPIERRO and the procedure verified as Laparoscopic Cholecystectomy with Intraoperative Cholangiogram. A Time Out was held and the above information confirmed.  Prior to the induction of general anesthesia, antibiotic prophylaxis was administered. General endotracheal anesthesia was then administered and tolerated well. After the induction, the abdomen was prepped with Chloraprep and draped in the sterile fashion. The patient was positioned in the supine position.  Local anesthetic agent was injected into the skin above the umbilicus and an incision made. We dissected down to the abdominal fascia with blunt dissection.  The fascia was incised vertically and we entered the peritoneal cavity bluntly.   A pursestring suture of 0-Vicryl was placed around the fascial opening.  The Hasson cannula was inserted and secured with the stay suture.  Pneumoperitoneum was then created with CO2 and tolerated well without any adverse changes in the patient's vital signs. An 11-mm port was placed in the subxiphoid position.  Two 5-mm ports were placed in the right upper quadrant. All skin incisions were infiltrated with a local anesthetic agent before making the incision and placing the trocars.   We positioned the patient in reverse Trendelenburg, tilted slightly to the patient's left.  The gallbladder was identified, the fundus grasped and retracted cephalad. Adhesions were lysed bluntly and with the electrocautery where indicated, taking care not to injure any adjacent organs or viscus. The infundibulum was grasped and retracted laterally, exposing the peritoneum overlying the triangle of Calot. This was then divided and exposed in a blunt fashion. A critical view of the cystic duct and cystic artery was obtained.  The cystic duct was clearly identified and bluntly dissected circumferentially. The cystic duct was ligated with a clip distally.   An incision was made in the cystic duct and the [facility] cholangiogram catheter introduced. The catheter was secured using a clip. A cholangiogram was then obtained which showed good visualization of the distal and proximal biliary tree with no sign of filling defects or obstruction.  Contrast flowed easily into the duodenum. The catheter was then removed.   The cystic duct was then ligated with clips and divided. The cystic artery was identified, dissected free, ligated with clips and divided as well.   The gallbladder was dissected from the liver bed in retrograde fashion with the electrocautery. The gallbladder was removed and placed in an Endocatch sac. The liver bed was irrigated and inspected. Hemostasis was achieved with the electrocautery. Copious irrigation was utilized and  was repeatedly aspirated until clear.  The gallbladder and Endocatch  sac were then removed through the umbilical port site.  The pursestring suture was used to close the umbilical fascia.    We again inspected the right upper quadrant for hemostasis.  Pneumoperitoneum was released as we removed the trocars.  4-0 Monocryl was used to close the skin.   Benzoin, steri-strips, and clean dressings were applied. The patient was then extubated and brought to the recovery room in stable condition. Instrument, sponge, and needle counts were correct at closure and at the conclusion of the case.   Findings: Cholecystitis without Cholelithiasis  Estimated Blood Loss: Minimal         Drains: none         Specimens: Gallbladder           Complications: None; patient tolerated the procedure well.         Disposition: PACU - hemodynamically stable.         Condition: stable  Wilmon Arms. Corliss Skains, MD, [facility] Surgery  12/12/2011 10:29 AM

## 2011-12-12 NOTE — Interval H&P Note (Signed)
History and Physical Interval Note:  12/12/2011 7:19 AM  Claudia Walsh  has presented today for surgery, with the diagnosis of gallstones  The various methods of treatment have been discussed with the patient and family. After consideration of risks, benefits and other options for treatment, the patient has consented to  Procedure(s) (LRB) with comments: LAPAROSCOPIC CHOLECYSTECTOMY WITH INTRAOPERATIVE CHOLANGIOGRAM (N/A) as a surgical intervention .  The patient's history has been reviewed, patient examined, no change in status, stable for surgery.  I have reviewed the patient's chart and labs.  Questions were answered to the patient's satisfaction.     Shanaye Rief K.

## 2011-12-12 NOTE — Anesthesia Preprocedure Evaluation (Addendum)
Anesthesia Evaluation  Patient identified by MRN, date of birth, ID band Patient awake    Reviewed: Allergy & Precautions, H&P , NPO status , Patient's Chart, lab work & pertinent test results  Airway Mallampati: II TM Distance: >3 FB Neck ROM: full    Dental No notable dental hx. (+) Teeth Intact and Dental Advisory Given   Pulmonary neg pulmonary ROS, asthma ,  Very recent URTI breath sounds clear to auscultation  Pulmonary exam normal       Cardiovascular Exercise Tolerance: Good negative cardio ROS  Rhythm:regular Rate:Normal     Neuro/Psych  Headaches, negative neurological ROS  negative psych ROS   GI/Hepatic negative GI ROS, Neg liver ROS, hiatal hernia, GERD-  Medicated and Controlled,  Endo/Other  negative endocrine ROSMorbid obesity  Renal/GU negative Renal ROS  negative genitourinary   Musculoskeletal   Abdominal (+) + obese,   Peds  Hematology negative hematology ROS (+)   Anesthesia Other Findings   Reproductive/Obstetrics negative OB ROS                         Anesthesia Physical Anesthesia Plan  ASA: II  Anesthesia Plan: General   Post-op Pain Management:    Induction: Intravenous  Airway Management Planned: Oral ETT  Additional Equipment:   Intra-op Plan:   Post-operative Plan: Extubation in OR  Informed Consent: I have reviewed the patients History and Physical, chart, labs and discussed the procedure including the risks, benefits and alternatives for the proposed anesthesia with the patient or authorized representative who has indicated his/her understanding and acceptance.   Dental Advisory Given  Plan Discussed with: CRNA and Surgeon  Anesthesia Plan Comments:         Anesthesia Quick Evaluation

## 2011-12-15 ENCOUNTER — Encounter (HOSPITAL_COMMUNITY): Payer: Self-pay | Admitting: Surgery

## 2011-12-17 ENCOUNTER — Encounter (HOSPITAL_COMMUNITY): Payer: Self-pay | Admitting: *Deleted

## 2011-12-17 ENCOUNTER — Emergency Department (HOSPITAL_COMMUNITY): Payer: Managed Care, Other (non HMO)

## 2011-12-17 ENCOUNTER — Observation Stay (HOSPITAL_COMMUNITY): Payer: Managed Care, Other (non HMO)

## 2011-12-17 ENCOUNTER — Observation Stay (HOSPITAL_COMMUNITY)
Admission: EM | Admit: 2011-12-17 | Discharge: 2011-12-18 | Disposition: A | Discharge status: Home / Self Care | Payer: Managed Care, Other (non HMO) | Attending: Emergency Medicine | Admitting: Emergency Medicine

## 2011-12-17 ENCOUNTER — Encounter (INDEPENDENT_AMBULATORY_CARE_PROVIDER_SITE_OTHER): Payer: Managed Care, Other (non HMO) | Admitting: Surgery

## 2011-12-17 DIAGNOSIS — M549 Dorsalgia, unspecified: Secondary | ICD-10-CM | POA: Insufficient documentation

## 2011-12-17 DIAGNOSIS — R1011 Right upper quadrant pain: Principal | ICD-10-CM | POA: Insufficient documentation

## 2011-12-17 DIAGNOSIS — Z8719 Personal history of other diseases of the digestive system: Secondary | ICD-10-CM | POA: Insufficient documentation

## 2011-12-17 DIAGNOSIS — F329 Major depressive disorder, single episode, unspecified: Secondary | ICD-10-CM | POA: Insufficient documentation

## 2011-12-17 DIAGNOSIS — K5900 Constipation, unspecified: Secondary | ICD-10-CM

## 2011-12-17 DIAGNOSIS — F411 Generalized anxiety disorder: Secondary | ICD-10-CM | POA: Insufficient documentation

## 2011-12-17 DIAGNOSIS — F3289 Other specified depressive episodes: Secondary | ICD-10-CM | POA: Insufficient documentation

## 2011-12-17 DIAGNOSIS — J45909 Unspecified asthma, uncomplicated: Secondary | ICD-10-CM | POA: Insufficient documentation

## 2011-12-17 DIAGNOSIS — Z87442 Personal history of urinary calculi: Secondary | ICD-10-CM | POA: Insufficient documentation

## 2011-12-17 DIAGNOSIS — R0602 Shortness of breath: Secondary | ICD-10-CM | POA: Insufficient documentation

## 2011-12-17 DIAGNOSIS — R109 Unspecified abdominal pain: Secondary | ICD-10-CM

## 2011-12-17 DIAGNOSIS — Z79899 Other long term (current) drug therapy: Secondary | ICD-10-CM | POA: Insufficient documentation

## 2011-12-17 DIAGNOSIS — K219 Gastro-esophageal reflux disease without esophagitis: Secondary | ICD-10-CM | POA: Insufficient documentation

## 2011-12-17 LAB — CBC WITH DIFFERENTIAL/PLATELET
Basophils Relative: 0 % (ref 0–1)
Eosinophils Absolute: 0.2 10*3/uL (ref 0.0–0.7)
Eosinophils Relative: 3 % (ref 0–5)
Hemoglobin: 11.9 g/dL — ABNORMAL LOW (ref 12.0–15.0)
MCH: 24.5 pg — ABNORMAL LOW (ref 26.0–34.0)
MCHC: 31.6 g/dL (ref 30.0–36.0)
MCV: 77.7 fL — ABNORMAL LOW (ref 78.0–100.0)
Monocytes Absolute: 0.4 10*3/uL (ref 0.1–1.0)
Monocytes Relative: 5 % (ref 3–12)
Neutrophils Relative %: 69 % (ref 43–77)

## 2011-12-17 LAB — URINALYSIS, MICROSCOPIC ONLY
Nitrite: NEGATIVE
Specific Gravity, Urine: 1.026 (ref 1.005–1.030)
Urobilinogen, UA: 0.2 mg/dL (ref 0.0–1.0)
pH: 7.5 (ref 5.0–8.0)

## 2011-12-17 LAB — COMPREHENSIVE METABOLIC PANEL
Albumin: 3.6 g/dL (ref 3.5–5.2)
BUN: 14 mg/dL (ref 6–23)
Calcium: 9.3 mg/dL (ref 8.4–10.5)
Creatinine, Ser: 0.66 mg/dL (ref 0.50–1.10)
GFR calc Af Amer: >90 mL/min (ref >90)
Potassium: 3.9 mEq/L (ref 3.5–5.1)
Total Protein: 7.4 g/dL (ref 6.0–8.3)

## 2011-12-17 LAB — LIPASE, BLOOD: Lipase: 14 U/L (ref 11–59)

## 2011-12-17 LAB — POCT PREGNANCY, URINE: Preg Test, Ur: NEGATIVE

## 2011-12-17 MED ORDER — DICYCLOMINE HCL 10 MG/ML IM SOLN
20.0000 mg | Freq: Once | INTRAMUSCULAR | Status: AC
  Administered 2011-12-17: 20 mg via INTRAMUSCULAR
  Filled 2011-12-17: qty 2

## 2011-12-17 MED ORDER — OXYCODONE-ACETAMINOPHEN 5-325 MG PO TABS
2.0000 | ORAL_TABLET | Freq: Once | ORAL | Status: AC
  Administered 2011-12-17: 2 via ORAL
  Filled 2011-12-17: qty 2

## 2011-12-17 MED ORDER — HYDROMORPHONE HCL PF 1 MG/ML IJ SOLN
1.0000 mg | Freq: Once | INTRAMUSCULAR | Status: AC
  Administered 2011-12-17: 1 mg via INTRAVENOUS
  Filled 2011-12-17: qty 1

## 2011-12-17 MED ORDER — IOHEXOL 350 MG/ML SOLN
100.0000 mL | Freq: Once | INTRAVENOUS | Status: AC | PRN
Start: 2011-12-17 — End: 2011-12-17

## 2011-12-17 MED ORDER — MORPHINE SULFATE 4 MG/ML IJ SOLN
4.0000 mg | Freq: Once | INTRAMUSCULAR | Status: AC
  Administered 2011-12-17: 4 mg via INTRAVENOUS
  Filled 2011-12-17: qty 1

## 2011-12-17 MED ORDER — MORPHINE SULFATE 4 MG/ML IJ SOLN
4.0000 mg | Freq: Once | INTRAMUSCULAR | Status: AC
  Administered 2011-12-17: 4 mg via INTRAVENOUS

## 2011-12-17 MED ORDER — MORPHINE SULFATE 4 MG/ML IJ SOLN
INTRAMUSCULAR | Status: AC
  Administered 2011-12-17: 4 mg via INTRAVENOUS
  Filled 2011-12-17: qty 1

## 2011-12-17 MED ORDER — ONDANSETRON HCL 4 MG/2ML IJ SOLN
4.0000 mg | Freq: Once | INTRAMUSCULAR | Status: AC
  Administered 2011-12-17: 4 mg via INTRAVENOUS
  Filled 2011-12-17: qty 2

## 2011-12-17 NOTE — ED Notes (Signed)
PA notified that pt is c/o pain again

## 2011-12-17 NOTE — ED Notes (Signed)
Pt reports having gallbladder surgery on Friday. Woke up this am with severe RLQ pain that radiates around to her back and to right shoulder. Feeling sob. Airway is intact at triage. Last bm yesterday.

## 2011-12-17 NOTE — ED Notes (Signed)
MD at bedside. 

## 2011-12-17 NOTE — ED Notes (Addendum)
Pt c/o right sided abd pain since this am. Pt states that she had sharp inc in pain over last 2 hrs. Pt had cholecystectomy Friday. Surgical sites are intact, clean and dry. No redness or swelling noted. Pt states pain is worse with palpation.

## 2011-12-17 NOTE — ED Notes (Signed)
Pt reports increase in pain, feeling sob and lightheaded. Vss, airway intact, able to speak in full sentences. Called lab to draw her blood.

## 2011-12-17 NOTE — ED Provider Notes (Signed)
Medical screening examination/treatment/procedure(s) were conducted as a shared visit with non-physician practitioner(s) and myself.  I personally evaluated the patient during the encounter   Loren Racer, MD 12/17/11 2306

## 2011-12-17 NOTE — ED Provider Notes (Addendum)
History     CSN: [id number]  Arrival date & time 12/17/11  1632   First MD Initiated Contact with Patient 12/17/11 1858      Chief Complaint  Patient presents with  . Abdominal Pain    (Consider location/radiation/quality/duration/timing/severity/associated sxs/prior treatment) Patient is a 31 y.o. female presenting with abdominal pain. The history is provided by the patient. No language interpreter was used.  Abdominal Pain The primary symptoms of the illness include abdominal pain and shortness of breath. The current episode started 6 to 12 hours ago. The onset of the illness was gradual. The problem has been gradually worsening.  The shortness of breath began today. The shortness of breath developed suddenly. The shortness of breath is moderate. The patient's medical history does not include asthma or chronic lung disease.  The illness is associated with awakening from sleep. The patient states that she believes she is currently not pregnant. The patient has had a change in bowel habit. Additional symptoms associated with the illness include back pain. Symptoms associated with the illness do not include chills or heartburn. Significant associated medical issues include inflammatory bowel disease.  Pt complains of pain in right upper abdomen.  Pt had her gallbladder removed on Friday by Dr. Margaree Mackintosh.  Pt reports she had severe pain starting this am.  Pt point to right upper quadrant and to her back as the areas of pain.  Pt complains of feeling short of breath.  Pt went to her surgeons office and was sent here for evaluation  Past Medical History  Diagnosis Date  . Kidney stone   . GERD (gastroesophageal reflux disease)   . Kidney stone   . Small bowel obstruction   . Asthma   . Anxiety   . Depression     states off all meds  . Depression   . H/O hiatal hernia   . Headache   . Cough with sputum 12/08/11    productive green phlegm in AM, clear nasal drainage with sinus congestion     Past Surgical History  Procedure Date  . Cesarean section   . Wisdom tooth extraction   . Tympanostomy tube placement   . Upper gastrointestinal endoscopy   . Cholecystectomy 12/12/2011    Procedure: LAPAROSCOPIC CHOLECYSTECTOMY WITH INTRAOPERATIVE CHOLANGIOGRAM;  Surgeon: Wilmon Arms. Corliss Skains, MD;  Location: [facility];  Service: General;  Laterality: N/A;    History reviewed. No pertinent family history.  History  Substance Use Topics  . Smoking status: Never Smoker   . Smokeless tobacco: Never Used  . Alcohol Use: Yes     Comment: rare    OB History    Grav Para Term Preterm Abortions TAB SAB Ect Mult Living                  Review of Systems  Constitutional: Negative for chills.  Respiratory: Positive for shortness of breath.   Gastrointestinal: Positive for abdominal pain. Negative for heartburn.  Musculoskeletal: Positive for back pain.  All other systems reviewed and are negative.    Allergies  Depo-provera; Nuvaring; and Ortho-cyclen  Home Medications   Current Outpatient Rx  Name  Route  Sig  Dispense  Refill  . ALBUTEROL SULFATE HFA 108 (90 BASE) MCG/ACT IN AERS   Inhalation   Inhale 2 puffs into the lungs every 6 (six) hours as needed. For shortness of breath.         Juanita Laster FLU/COLD PO   Oral   Take 4 oz by  mouth as needed. Cold symptoms         . IBUPROFEN 200 MG PO TABS   Oral   Take 800 mg by mouth every 8 (eight) hours as needed. For pain         . NON FORMULARY      Alka seltzer plus/ night time/   Every 4 hours as needed for cold         . ONDANSETRON HCL 4 MG PO TABS   Oral   Take 4 mg by mouth every 8 (eight) hours as needed. For nausea.         Marland Kitchen OVER THE COUNTER MEDICATION      [facility] brand liquid antacid(mylanta, maalox)         . OXYCODONE-ACETAMINOPHEN 5-325 MG PO TABS   Oral   Take 1 tablet by mouth every 4 (four) hours as needed for pain.   40 tablet   0   . PANTOPRAZOLE SODIUM 40 MG PO TBEC   Oral   Take  80 mg by mouth at bedtime.          Marland Kitchen RANITIDINE HCL 150 MG PO CAPS   Oral   Take 150 mg by mouth at bedtime.           BP 130/89  Pulse 82  Temp 97.4 F (36.3 C)  Resp 14  SpO2 100%  Physical Exam  Nursing note and vitals reviewed. Constitutional: She is oriented to person, place, and time. She appears well-developed and well-nourished.  HENT:  Head: Normocephalic and atraumatic.  Right Ear: External ear normal.  Eyes: Conjunctivae normal and EOM are normal. Pupils are equal, round, and reactive to light.  Neck: Normal range of motion. Neck supple.  Cardiovascular: Normal rate, regular rhythm and normal heart sounds.   Pulmonary/Chest: Effort normal and breath sounds normal.  Abdominal: Soft. There is tenderness.  Musculoskeletal: Normal range of motion.  Neurological: She is alert and oriented to person, place, and time. She has normal reflexes.  Skin: Skin is warm.  Psychiatric: She has a normal mood and affect.    ED Course  Procedures (including critical care time)  Labs Reviewed  CBC WITH DIFFERENTIAL - Abnormal; Notable for the following:    Hemoglobin 11.9 (*)     MCV 77.7 (*)     MCH 24.5 (*)     All other components within normal limits  COMPREHENSIVE METABOLIC PANEL - Abnormal; Notable for the following:    ALT 41 (*)     All other components within normal limits  LIPASE, BLOOD  URINALYSIS, MICROSCOPIC ONLY   No results found.   1. Constipation   2. Abdominal pain       MDM  Pt given Iv fluids and pain medication.  Acute abdominal series shows constipation,  Ultrasound no acute abnormalities,  Labs are normal,  Dr. Ranae Palms in to see and examine.  Due to shortness of breath,  I ordered a Ct angio chest to make sure pt does not have a PE       Lonia Skinner [place], [place] 12/17/11 2244  Lonia Skinner [place], [place] 12/17/11 2244  Lonia Skinner [place], [place] 12/18/11 0030  Lonia Skinner [place], [place] 12/18/11 [phone]

## 2011-12-17 NOTE — ED Notes (Signed)
PA notified of pt pain

## 2011-12-18 LAB — URINE CULTURE: Colony Count: 8000

## 2011-12-18 MED ORDER — ESOMEPRAZOLE MAGNESIUM 40 MG PO CPDR: 40.0000 mg | DELAYED_RELEASE_CAPSULE | Freq: Every day | ORAL | Status: DC

## 2011-12-18 MED ORDER — POLYETHYLENE GLYCOL 3350 17 G PO PACK: 17.0000 g | PACK | Freq: Every day | ORAL | Status: DC

## 2011-12-18 NOTE — ED Provider Notes (Signed)
Medical screening examination/treatment/procedure(s) were performed by non-physician practitioner and as supervising physician I was immediately available for consultation/collaboration.   Loren Racer, MD 12/18/11 [phone]

## 2011-12-22 ENCOUNTER — Telehealth (INDEPENDENT_AMBULATORY_CARE_PROVIDER_SITE_OTHER): Payer: Self-pay | Admitting: General Surgery

## 2011-12-22 NOTE — Telephone Encounter (Signed)
Pt called to report she is still having a lot of pain and is scheduled to return to work today.  She stated she "had a complication with pain medicine."  Pt went to ER with severe abdominal pain secondary to constipation from Percocet.  She is now on stool softeners and Miralax with regular BMs, but has stopped taking the Percocet altogether.  Using Tylenol and Advil instead.  Pt cannot drive, wear a seat belt or sit for any period of time without pain.  Offered to send letter to her employer, but she said that Monia Pouch will FAX form to fill out instead.

## 2011-12-29 ENCOUNTER — Encounter (INDEPENDENT_AMBULATORY_CARE_PROVIDER_SITE_OTHER): Payer: Self-pay | Admitting: Surgery

## 2011-12-29 ENCOUNTER — Encounter (HOSPITAL_COMMUNITY): Payer: Managed Care, Other (non HMO)

## 2011-12-29 ENCOUNTER — Ambulatory Visit (INDEPENDENT_AMBULATORY_CARE_PROVIDER_SITE_OTHER): Payer: Managed Care, Other (non HMO) | Admitting: Surgery

## 2011-12-29 VITALS — BP 116/82 | HR 87 | Temp 97.1°F | Ht 64.0 in | Wt 336.8 lb

## 2011-12-29 DIAGNOSIS — K811 Chronic cholecystitis: Secondary | ICD-10-CM

## 2011-12-29 NOTE — Progress Notes (Signed)
Status post left cholecystectomy with intraoperative clinic grandmother 12/12/11 for chronic cholecystitis. The patient had significant postop constipation resulting in severe abdominal pain. This is now much better with aggressive laxatives. She did have one ER visit which had a negative CT angiogram chest. She is having some abdominal bloating when she eats fatty foods. Her incisions are well-healed with no sign of infection.  No abdominal tenderness.  She may resume full activity. Followup with [place] as needed  Wilmon Arms. Corliss Skains, MD, [facility]  12/29/2011 12:02 PM

## 2012-02-10 ENCOUNTER — Encounter (INDEPENDENT_AMBULATORY_CARE_PROVIDER_SITE_OTHER): Payer: Self-pay

## 2012-06-04 ENCOUNTER — Emergency Department (HOSPITAL_BASED_OUTPATIENT_CLINIC_OR_DEPARTMENT_OTHER)
Admission: EM | Admit: 2012-06-04 | Discharge: 2012-06-04 | Disposition: A | Discharge status: Home / Self Care | Payer: Managed Care, Other (non HMO) | Attending: Emergency Medicine | Admitting: Emergency Medicine

## 2012-06-04 ENCOUNTER — Encounter (HOSPITAL_BASED_OUTPATIENT_CLINIC_OR_DEPARTMENT_OTHER): Payer: Self-pay | Admitting: *Deleted

## 2012-06-04 DIAGNOSIS — Z8669 Personal history of other diseases of the nervous system and sense organs: Secondary | ICD-10-CM | POA: Insufficient documentation

## 2012-06-04 DIAGNOSIS — M5430 Sciatica, unspecified side: Secondary | ICD-10-CM | POA: Insufficient documentation

## 2012-06-04 DIAGNOSIS — Z79899 Other long term (current) drug therapy: Secondary | ICD-10-CM | POA: Insufficient documentation

## 2012-06-04 DIAGNOSIS — IMO0002 Reserved for concepts with insufficient information to code with codable children: Secondary | ICD-10-CM | POA: Insufficient documentation

## 2012-06-04 DIAGNOSIS — K219 Gastro-esophageal reflux disease without esophagitis: Secondary | ICD-10-CM | POA: Insufficient documentation

## 2012-06-04 DIAGNOSIS — Z8719 Personal history of other diseases of the digestive system: Secondary | ICD-10-CM | POA: Insufficient documentation

## 2012-06-04 DIAGNOSIS — Z8659 Personal history of other mental and behavioral disorders: Secondary | ICD-10-CM | POA: Insufficient documentation

## 2012-06-04 DIAGNOSIS — Z3202 Encounter for pregnancy test, result negative: Secondary | ICD-10-CM | POA: Insufficient documentation

## 2012-06-04 DIAGNOSIS — J45909 Unspecified asthma, uncomplicated: Secondary | ICD-10-CM | POA: Insufficient documentation

## 2012-06-04 DIAGNOSIS — Z87442 Personal history of urinary calculi: Secondary | ICD-10-CM | POA: Insufficient documentation

## 2012-06-04 DIAGNOSIS — M5431 Sciatica, right side: Secondary | ICD-10-CM

## 2012-06-04 HISTORY — DX: Morbid (severe) obesity due to excess calories: E66.01

## 2012-06-04 LAB — URINALYSIS, ROUTINE W REFLEX MICROSCOPIC
Glucose, UA: NEGATIVE mg/dL
Leukocytes, UA: NEGATIVE
Specific Gravity, Urine: 1.036 — ABNORMAL HIGH (ref 1.005–1.030)
pH: 5.5 (ref 5.0–8.0)

## 2012-06-04 MED ORDER — OXYCODONE-ACETAMINOPHEN 5-325 MG PO TABS
2.0000 | ORAL_TABLET | Freq: Once | ORAL | Status: AC
  Administered 2012-06-04: 2 via ORAL
  Filled 2012-06-04 (×2): qty 2

## 2012-06-04 MED ORDER — OXYCODONE-ACETAMINOPHEN 5-325 MG PO TABS: 1.0000 | ORAL_TABLET | ORAL | Status: DC | PRN

## 2012-06-04 MED ORDER — KETOROLAC TROMETHAMINE 60 MG/2ML IM SOLN
60.0000 mg | Freq: Once | INTRAMUSCULAR | Status: AC
  Administered 2012-06-04: 60 mg via INTRAMUSCULAR
  Filled 2012-06-04: qty 2

## 2012-06-04 MED ORDER — CYCLOBENZAPRINE HCL 10 MG PO TABS: 10.0000 mg | ORAL_TABLET | Freq: Two times a day (BID) | ORAL | Status: DC | PRN

## 2012-06-04 MED ORDER — CYCLOBENZAPRINE HCL 10 MG PO TABS
5.0000 mg | ORAL_TABLET | Freq: Once | ORAL | Status: AC
  Administered 2012-06-04: 5 mg via ORAL
  Filled 2012-06-04: qty 1

## 2012-06-04 NOTE — ED Notes (Signed)
Pt c/o lower back pain worsening over last 3 days. Pt has had chronic back pain since pregnancy 4 years ago. Pt has seen a chiropractor intermittently since then and was seen by chiropractor yesterday. Pt has not had MRI to dx back pain. Pt reports increased pain with movement and walking.

## 2012-06-04 NOTE — ED Notes (Signed)
Pt c/o lower back pain which radiates down right leg x 3 days 

## 2012-06-04 NOTE — ED Notes (Signed)
MD at bedside. 

## 2012-06-04 NOTE — ED Provider Notes (Signed)
History     CSN: [id number]  Arrival date & time 06/04/12  1857   First MD Initiated Contact with Patient 06/04/12 2030      Chief Complaint  Patient presents with  . Back Pain    (Consider location/radiation/quality/duration/timing/severity/associated sxs/prior treatment) Patient is a 32 y.o. female presenting with back pain. The history is provided by the patient.  Back Pain Location:  Lumbar spine Quality:  Shooting Radiates to:  R posterior upper leg Pain severity:  Moderate Pain is:  Unable to specify Onset quality:  Gradual Timing:  Constant Progression:  Worsening Chronicity:  Recurrent Context: not emotional stress, not falling, not jumping from heights, not lifting heavy objects, not MCA and not occupational injury   Relieved by:  Nothing (Patient saw her chiropractor yesterday and received no relief from treatment.) Worsened by:  Nothing tried Associated symptoms: no abdominal pain, no abdominal swelling, no dysuria, no fever and no paresthesias     Past Medical History  Diagnosis Date  . Kidney stone   . GERD (gastroesophageal reflux disease)   . Kidney stone   . Small bowel obstruction   . Asthma   . Anxiety   . Depression     states off all meds  . Depression   . H/O hiatal hernia   . Headache   . Cough with sputum 12/08/11    productive green phlegm in AM, clear nasal drainage with sinus congestion  . Morbid obesity     Past Surgical History  Procedure Laterality Date  . Cesarean section    . Wisdom tooth extraction    . Tympanostomy tube placement    . Upper gastrointestinal endoscopy    . Cholecystectomy  12/12/2011    Procedure: LAPAROSCOPIC CHOLECYSTECTOMY WITH INTRAOPERATIVE CHOLANGIOGRAM;  Surgeon: Wilmon Arms. Corliss Skains, MD;  Location: [facility];  Service: General;  Laterality: N/A;    History reviewed. No pertinent family history.  History  Substance Use Topics  . Smoking status: Never Smoker   . Smokeless tobacco: Never Used  . Alcohol  Use: No     Comment: rare    OB History   Grav Para Term Preterm Abortions TAB SAB Ect Mult Living                  Review of Systems  Constitutional: Negative for fever.  Gastrointestinal: Negative for abdominal pain.  Genitourinary: Negative for dysuria.  Musculoskeletal: Positive for back pain.  Neurological: Negative for paresthesias.  All other systems reviewed and are negative.    Allergies  Depo-provera; Nuvaring; and Ortho-cyclen  Home Medications   Current Outpatient Rx  Name  Route  Sig  Dispense  Refill  . budesonide-formoterol (SYMBICORT) 80-4.5 MCG/ACT inhaler   Inhalation   Inhale 2 puffs into the lungs 2 (two) times daily.         Marland Kitchen oxyCODONE-acetaminophen (PERCOCET) 10-325 MG per tablet   Oral   Take 1 tablet by mouth every 4 (four) hours as needed for pain.         Marland Kitchen albuterol (PROVENTIL HFA;VENTOLIN HFA) 108 (90 BASE) MCG/ACT inhaler   Inhalation   Inhale 2 puffs into the lungs every 6 (six) hours as needed. For shortness of breath.         . Chlorphen-Pseudoephed-APAP (THERAFLU FLU/COLD PO)   Oral   Take 4 oz by mouth as needed. Cold symptoms         . esomeprazole (NEXIUM) 40 MG capsule   Oral   Take 1  capsule (40 mg total) by mouth daily.   30 capsule   0   . ibuprofen (ADVIL,MOTRIN) 200 MG tablet   Oral   Take 800 mg by mouth every 8 (eight) hours as needed. For pain         . NON FORMULARY      Alka seltzer plus/ night time/   Every 4 hours as needed for cold         . ondansetron (ZOFRAN) 4 MG tablet   Oral   Take 4 mg by mouth every 8 (eight) hours as needed. For nausea.           BP 116/62  Temp(Src) 97.9 F (36.6 C)  Resp 16  Ht 5\' 4"  (1.626 m)  Wt 342 lb (155.13 kg)  BMI 58.68 kg/m2  SpO2 97%  Physical Exam  Nursing note and vitals reviewed. Constitutional: She is oriented to person, place, and time. She appears well-developed and well-nourished.  Morbidly obese  HENT:  Head: Normocephalic and  atraumatic.  Eyes: Conjunctivae are normal. Pupils are equal, round, and reactive to light.  Neck: Normal range of motion. Neck supple.  Cardiovascular: Normal rate and regular rhythm.   Pulmonary/Chest: Effort normal and breath sounds normal.  Abdominal: Soft. Bowel sounds are normal.  Musculoskeletal: Normal range of motion.  Neurological: She is alert and oriented to person, place, and time. She has normal strength and normal reflexes. She displays a negative Romberg sign.  Ambulatory with normal gait    ED Course  Procedures (including critical care time)  Labs Reviewed  PREGNANCY, URINE  URINALYSIS, ROUTINE W REFLEX MICROSCOPIC   No results found.   No diagnosis found.    MDM  No signs of UTI or pregnancy. Patient's signs and symptoms are consistent with sciatica. She is given Flexeril and word all here. She'll be given prescription for Percocet and Flexeril the        Hilario Quarry, MD 06/05/12 [phone]

## 2012-12-10 ENCOUNTER — Other Ambulatory Visit: Payer: Self-pay | Admitting: Obstetrics & Gynecology

## 2012-12-10 ENCOUNTER — Other Ambulatory Visit (HOSPITAL_COMMUNITY)
Admission: RE | Admit: 2012-12-10 | Discharge: 2012-12-10 | Disposition: A | Discharge status: Home / Self Care | Payer: Managed Care, Other (non HMO) | Source: Ambulatory Visit | Attending: Obstetrics & Gynecology | Admitting: Obstetrics & Gynecology

## 2012-12-10 DIAGNOSIS — R87810 Cervical high risk human papillomavirus (HPV) DNA test positive: Secondary | ICD-10-CM | POA: Insufficient documentation

## 2012-12-10 DIAGNOSIS — Z1151 Encounter for screening for human papillomavirus (HPV): Secondary | ICD-10-CM | POA: Insufficient documentation

## 2012-12-10 DIAGNOSIS — N760 Acute vaginitis: Secondary | ICD-10-CM | POA: Insufficient documentation

## 2012-12-10 DIAGNOSIS — Z01419 Encounter for gynecological examination (general) (routine) without abnormal findings: Secondary | ICD-10-CM | POA: Insufficient documentation

## 2012-12-13 ENCOUNTER — Other Ambulatory Visit (HOSPITAL_COMMUNITY)
Admission: RE | Admit: 2012-12-13 | Discharge: 2012-12-13 | Disposition: A | Discharge status: Home / Self Care | Payer: Managed Care, Other (non HMO) | Source: Ambulatory Visit | Attending: Obstetrics & Gynecology | Admitting: Obstetrics & Gynecology

## 2012-12-13 DIAGNOSIS — R87810 Cervical high risk human papillomavirus (HPV) DNA test positive: Secondary | ICD-10-CM | POA: Insufficient documentation

## 2013-02-13 ENCOUNTER — Encounter (HOSPITAL_COMMUNITY): Payer: Self-pay | Admitting: Emergency Medicine

## 2013-02-13 ENCOUNTER — Emergency Department (INDEPENDENT_AMBULATORY_CARE_PROVIDER_SITE_OTHER)
Admission: EM | Admit: 2013-02-13 | Discharge: 2013-02-13 | Disposition: A | Discharge status: Home / Self Care | Payer: Managed Care, Other (non HMO) | Source: Home / Self Care | Attending: Family Medicine | Admitting: Family Medicine

## 2013-02-13 DIAGNOSIS — M533 Sacrococcygeal disorders, not elsewhere classified: Secondary | ICD-10-CM

## 2013-02-13 LAB — OCCULT BLOOD, POC DEVICE: Fecal Occult Bld: NEGATIVE

## 2013-02-13 MED ORDER — KETOROLAC TROMETHAMINE 60 MG/2ML IM SOLN
60.0000 mg | Freq: Once | INTRAMUSCULAR | Status: AC
  Administered 2013-02-13: 60 mg via INTRAMUSCULAR

## 2013-02-13 MED ORDER — OXYCODONE-ACETAMINOPHEN 5-325 MG PO TABS: 1.0000 | ORAL_TABLET | ORAL | Status: DC | PRN

## 2013-02-13 MED ORDER — KETOROLAC TROMETHAMINE 60 MG/2ML IM SOLN
INTRAMUSCULAR | Status: AC
  Filled 2013-02-13: qty 2

## 2013-02-13 MED ORDER — METHYLPREDNISOLONE ACETATE 80 MG/ML IJ SUSP
INTRAMUSCULAR | Status: AC
  Filled 2013-02-13: qty 1

## 2013-02-13 MED ORDER — ETODOLAC 500 MG PO TABS: 500.0000 mg | ORAL_TABLET | Freq: Two times a day (BID) | ORAL | Status: DC

## 2013-02-13 MED ORDER — METHYLPREDNISOLONE ACETATE 40 MG/ML IJ SUSP
80.0000 mg | Freq: Once | INTRAMUSCULAR | Status: AC
  Administered 2013-02-13: 80 mg via INTRAMUSCULAR

## 2013-02-13 NOTE — ED Notes (Signed)
C/o tailbone pain States she has not had no injury States she has used ibuprofen, ice and rest the area as tx Sx started BJ'sfriday

## 2013-02-13 NOTE — ED Provider Notes (Signed)
CSN: [id number]     Arrival date & time 02/13/13  8295 History   First MD Initiated Contact with Patient 02/13/13 1032     Chief Complaint  Patient presents with  . Tailbone Pain   (Consider location/radiation/quality/duration/timing/severity/associated sxs/prior Treatment) HPI Comments: 33 year old female presents complaining of pain in her coccyx or 2 days. She has no known injury. She has had this happen once before and she states that she ended up confined to her bed for one month with sciatica. She has been taking ibuprofen, left over Percocet, and has been resting with the pain only increases. She denies any systemic symptoms. No pain with defecation.   Past Medical History  Diagnosis Date  . Kidney stone   . GERD (gastroesophageal reflux disease)   . Kidney stone   . Small bowel obstruction   . Asthma   . Anxiety   . Depression     states off all meds  . Depression   . H/O hiatal hernia   . Headache(784.0)   . Cough with sputum 12/08/11    productive green phlegm in AM, clear nasal drainage with sinus congestion  . Morbid obesity    Past Surgical History  Procedure Laterality Date  . Cesarean section    . Wisdom tooth extraction    . Tympanostomy tube placement    . Upper gastrointestinal endoscopy    . Cholecystectomy  12/12/2011    Procedure: LAPAROSCOPIC CHOLECYSTECTOMY WITH INTRAOPERATIVE CHOLANGIOGRAM;  Surgeon: Wilmon Arms. Corliss Skains, MD;  Location: [facility];  Service: General;  Laterality: N/A;   History reviewed. No pertinent family history. History  Substance Use Topics  . Smoking status: Never Smoker   . Smokeless tobacco: Never Used  . Alcohol Use: No     Comment: rare   OB History   Grav Para Term Preterm Abortions TAB SAB Ect Mult Living                 Review of Systems  Constitutional: Negative for fever and chills.  Eyes: Negative for visual disturbance.  Respiratory: Negative for cough and shortness of breath.   Cardiovascular: Negative for chest  pain, palpitations and leg swelling.  Gastrointestinal: Negative for nausea, vomiting and abdominal pain.  Endocrine: Negative for polydipsia and polyuria.  Genitourinary: Negative for dysuria, urgency and frequency.  Musculoskeletal: Positive for back pain.       See history of present illness  Skin: Negative for rash.  Neurological: Negative for dizziness, weakness and light-headedness.    Allergies  Depo-provera; Nuvaring; and Ortho-cyclen  Home Medications   Current Outpatient Rx  Name  Route  Sig  Dispense  Refill  . albuterol (PROVENTIL HFA;VENTOLIN HFA) 108 (90 BASE) MCG/ACT inhaler   Inhalation   Inhale 2 puffs into the lungs every 6 (six) hours as needed. For shortness of breath.         . budesonide-formoterol (SYMBICORT) 80-4.5 MCG/ACT inhaler   Inhalation   Inhale 2 puffs into the lungs 2 (two) times daily.         . Chlorphen-Pseudoephed-APAP (THERAFLU FLU/COLD PO)   Oral   Take 4 oz by mouth as needed. Cold symptoms         . cyclobenzaprine (FLEXERIL) 10 MG tablet   Oral   Take 1 tablet (10 mg total) by mouth 2 (two) times daily as needed for muscle spasms.   20 tablet   0   . esomeprazole (NEXIUM) 40 MG capsule   Oral   Take 1  capsule (40 mg total) by mouth daily.   30 capsule   0   . etodolac (LODINE) 500 MG tablet   Oral   Take 1 tablet (500 mg total) by mouth 2 (two) times daily.   60 tablet   1   . ibuprofen (ADVIL,MOTRIN) 200 MG tablet   Oral   Take 800 mg by mouth every 8 (eight) hours as needed. For pain         . NON FORMULARY      Alka seltzer plus/ night time/   Every 4 hours as needed for cold         . ondansetron (ZOFRAN) 4 MG tablet   Oral   Take 4 mg by mouth every 8 (eight) hours as needed. For nausea.         Marland Kitchen. oxyCODONE-acetaminophen (PERCOCET) 10-325 MG per tablet   Oral   Take 1 tablet by mouth every 4 (four) hours as needed for pain.         Marland Kitchen. oxyCODONE-acetaminophen (PERCOCET/ROXICET) 5-325 MG per  tablet   Oral   Take 1 tablet by mouth every 4 (four) hours as needed.   20 tablet   0    BP 135/81  Pulse 87  Temp(Src) 97.7 F (36.5 C) (Oral)  Resp 17  SpO2 100% Physical Exam  Nursing note and vitals reviewed. Constitutional: She is oriented to person, place, and time. Vital signs are normal. She appears well-developed and well-nourished. No distress.  HENT:  Head: Normocephalic and atraumatic.  Pulmonary/Chest: Effort normal. No respiratory distress.  Genitourinary: Rectal exam shows tenderness (Tenderness with palpation of the coccyx). Rectal exam shows no external hemorrhoid, no internal hemorrhoid, no mass and anal tone normal. Guaiac negative stool.  No obvious abscess or infection  Musculoskeletal:       Lumbar back: She exhibits bony tenderness (very low, over coccyx).  Neurological: She is alert and oriented to person, place, and time. She has normal strength. Coordination normal.  Skin: Skin is warm and dry. No rash noted. She is not diaphoretic.  Psychiatric: She has a normal mood and affect. Judgment normal.    ED Course  Procedures (including critical care time) Labs Review Labs Reviewed  OCCULT BLOOD, POC DEVICE   Imaging Review No results found.    MDM   1. Coccydynia    Toradol and Depo-Medrol given here. Start her on twice a day etodolac, Percocet when necessary. She will followup with her primary care physician. Advised using a doughnut while sitting at her work chair.   Meds ordered this encounter  Medications  . oxyCODONE-acetaminophen (PERCOCET/ROXICET) 5-325 MG per tablet    Sig: Take 1 tablet by mouth every 4 (four) hours as needed.    Dispense:  20 tablet    Refill:  0    Order Specific Question:  Supervising Provider    Answer:  Linna HoffKINDL, JAMES D [phone][[id number]]  . etodolac (LODINE) 500 MG tablet    Sig: Take 1 tablet (500 mg total) by mouth 2 (two) times daily.    Dispense:  60 tablet    Refill:  1    Order Specific Question:  Supervising  Provider    Answer:  Linna HoffKINDL, JAMES D [phone][[id number]]  . ketorolac (TORADOL) injection 60 mg    Sig:   . methylPREDNISolone acetate (DEPO-MEDROL) injection 80 mg    Sig:      Graylon GoodZachary H Delphin Funes, PA-C 02/14/13 1314

## 2013-02-13 NOTE — Discharge Instructions (Signed)
Tailbone Injury °The tailbone (coccyx) is the small bone at the lower end of the spine. A tailbone injury may involve stretched ligaments, bruising, or a broken bone (fracture). Women are more vulnerable to this injury due to having a wider pelvis. °CAUSES  °This type of injury typically occurs from falling and landing on the tailbone. Repeated strain or friction from actions such as rowing and bicycling may also injure the area. The tailbone can be injured during childbirth. Infections or tumors may also press on the tailbone and cause pain. Sometimes, the cause of injury is unknown. °SYMPTOMS  °· Bruising. °· Pain when sitting. °· Painful bowel movements. °· In women, pain during intercourse. °DIAGNOSIS  °Your caregiver can diagnose a tailbone injury based on your symptoms and a physical exam. X-rays may be taken if a fracture is suspected. Your caregiver may also use an MRI scan imaging test to evaluate your symptoms. °TREATMENT  °Your caregiver may prescribe medicines to help relieve your pain. Most tailbone injuries heal on their own in 4 to 6 weeks. However, if the injury is caused by an infection or tumor, the recovery period may vary. °PREVENTION  °Wear appropriate padding and sports gear when bicycling and rowing. This can help prevent an injury from repeated strain or friction. °HOME CARE INSTRUCTIONS  °· Put ice on the injured area. °· Put ice in a plastic bag. °· Place a towel between your skin and the bag. °· Leave the ice on for 15-20 minutes, every hour while awake for the first 1 to 2 days. °· Sit on a large, rubber or inflated ring or cushion to ease your pain. Lean forward when sitting to help decrease discomfort. °· Avoid sitting for long periods of time. °· Increase your activity as the pain allows. °· Only take over-the-counter or prescription medicines for pain, discomfort, or fever as directed by your caregiver. °· You may use stool softeners if it is painful to have a bowel movement, or as  directed by your caregiver. °· Eat a diet with plenty of fiber to help prevent constipation. °· Keep all follow-up appointments as directed by your caregiver. °SEEK MEDICAL CARE IF:  °· Your pain becomes worse. °· Your bowel movements cause a great deal of discomfort. °· You are unable to have a bowel movement. °· You have a fever. °MAKE SURE YOU: °· Understand these instructions. °· Will watch your condition. °· Will get help right away if you are not doing well or get worse. °Document Released: 01/04/2000 Document Revised: 03/31/2011 Document Reviewed: 08/01/2010 °[employer]® Patient Information ©2014 [employer]. ° °

## 2013-02-14 NOTE — ED Provider Notes (Signed)
Medical screening examination/treatment/procedure(s) were performed by resident physician or non-physician practitioner and as supervising physician I was immediately available for consultation/collaboration.   Trejuan Matherne DOUGLAS MD.   Tye Vigo D Lakevia Perris, MD 02/14/13 1442 

## 2013-05-14 ENCOUNTER — Emergency Department (HOSPITAL_COMMUNITY)
Admission: EM | Admit: 2013-05-14 | Discharge: 2013-05-14 | Discharge status: Against Medical Advice | Payer: Managed Care, Other (non HMO) | Attending: Emergency Medicine | Admitting: Emergency Medicine

## 2013-05-14 ENCOUNTER — Encounter (HOSPITAL_COMMUNITY): Payer: Self-pay | Admitting: Emergency Medicine

## 2013-05-14 DIAGNOSIS — J45909 Unspecified asthma, uncomplicated: Secondary | ICD-10-CM | POA: Insufficient documentation

## 2013-05-14 DIAGNOSIS — H579 Unspecified disorder of eye and adnexa: Secondary | ICD-10-CM | POA: Insufficient documentation

## 2013-05-14 DIAGNOSIS — F1210 Cannabis abuse, uncomplicated: Secondary | ICD-10-CM

## 2013-05-14 DIAGNOSIS — R209 Unspecified disturbances of skin sensation: Secondary | ICD-10-CM | POA: Insufficient documentation

## 2013-05-14 NOTE — ED Notes (Signed)
Pt states she smoked marijuana tonight for the first time. Pt states her body feels numb. +eye fluttering, +stuttering, using hand gestures to speak

## 2013-05-14 NOTE — ED Notes (Signed)
Pt speaking in clear full sentences at this time. Pt states she is feeling much better at this time and is going home at this time.

## 2013-05-14 NOTE — ED Notes (Signed)
Pt brought to ED by EMS from home after smoking marijuana tonight. Pt c/o feeling numb and floating feeling.

## 2013-10-20 ENCOUNTER — Other Ambulatory Visit: Payer: Self-pay | Admitting: Obstetrics & Gynecology

## 2014-03-09 ENCOUNTER — Other Ambulatory Visit (HOSPITAL_COMMUNITY)
Admission: RE | Admit: 2014-03-09 | Discharge: 2014-03-09 | Disposition: A | Discharge status: Home / Self Care | Payer: BLUE CROSS/BLUE SHIELD | Source: Ambulatory Visit | Attending: Obstetrics & Gynecology | Admitting: Obstetrics & Gynecology

## 2014-03-09 ENCOUNTER — Other Ambulatory Visit: Payer: Self-pay | Admitting: Obstetrics & Gynecology

## 2014-03-09 DIAGNOSIS — Z1151 Encounter for screening for human papillomavirus (HPV): Secondary | ICD-10-CM | POA: Insufficient documentation

## 2014-03-09 DIAGNOSIS — R87810 Cervical high risk human papillomavirus (HPV) DNA test positive: Secondary | ICD-10-CM | POA: Diagnosis present

## 2014-03-09 DIAGNOSIS — Z01411 Encounter for gynecological examination (general) (routine) with abnormal findings: Secondary | ICD-10-CM | POA: Insufficient documentation

## 2014-03-09 DIAGNOSIS — Z113 Encounter for screening for infections with a predominantly sexual mode of transmission: Secondary | ICD-10-CM | POA: Insufficient documentation

## 2014-03-10 LAB — CYTOLOGY - PAP

## 2014-03-28 IMAGING — CR DG ABDOMEN ACUTE W/ 1V CHEST
4 series · 4 of 4 positions shown · non-contrast
Comparison: CT 11/06/2010.

CLINICAL DATA: Upper abdominal pain, nausea, vomiting.

ACUTE ABDOMEN SERIES (ABDOMEN 2 VIEW & CHEST 1 VIEW)

[w chest pa]
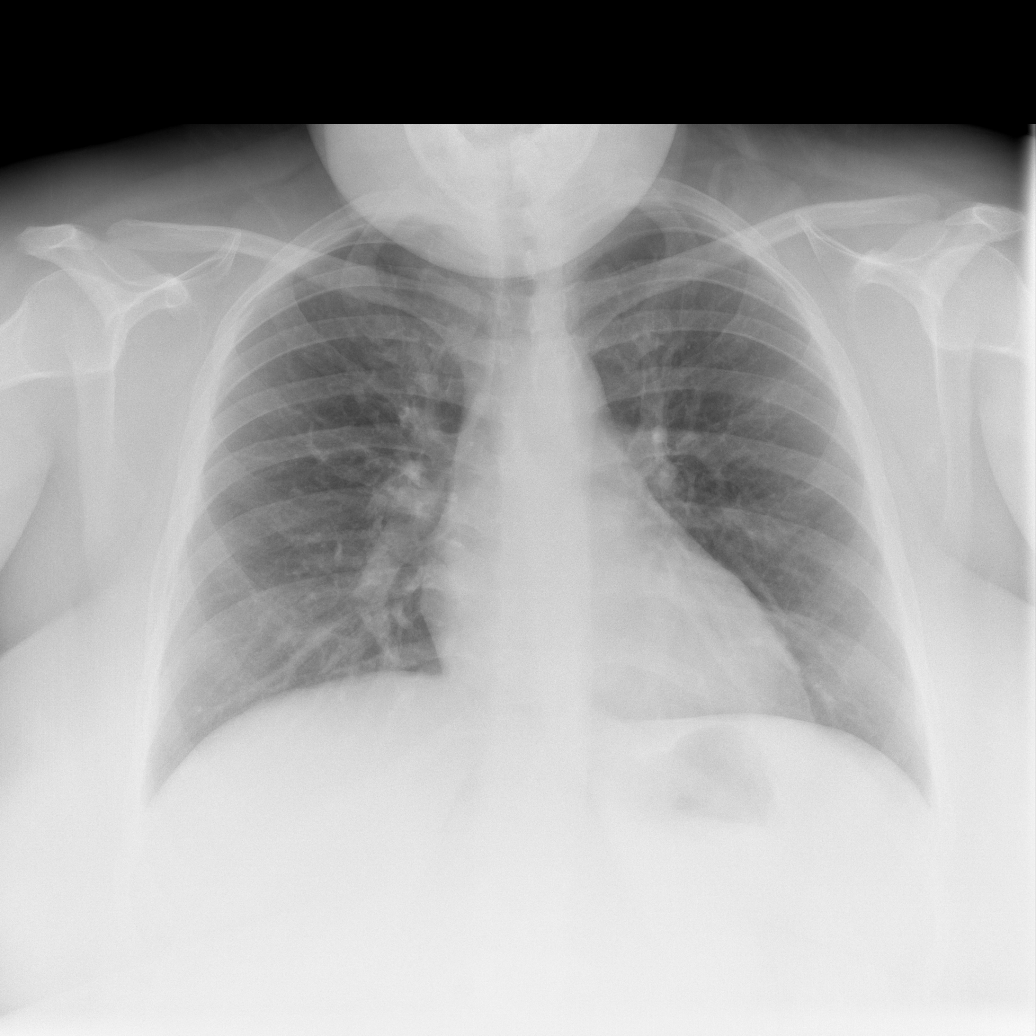

[w abdomen upright]
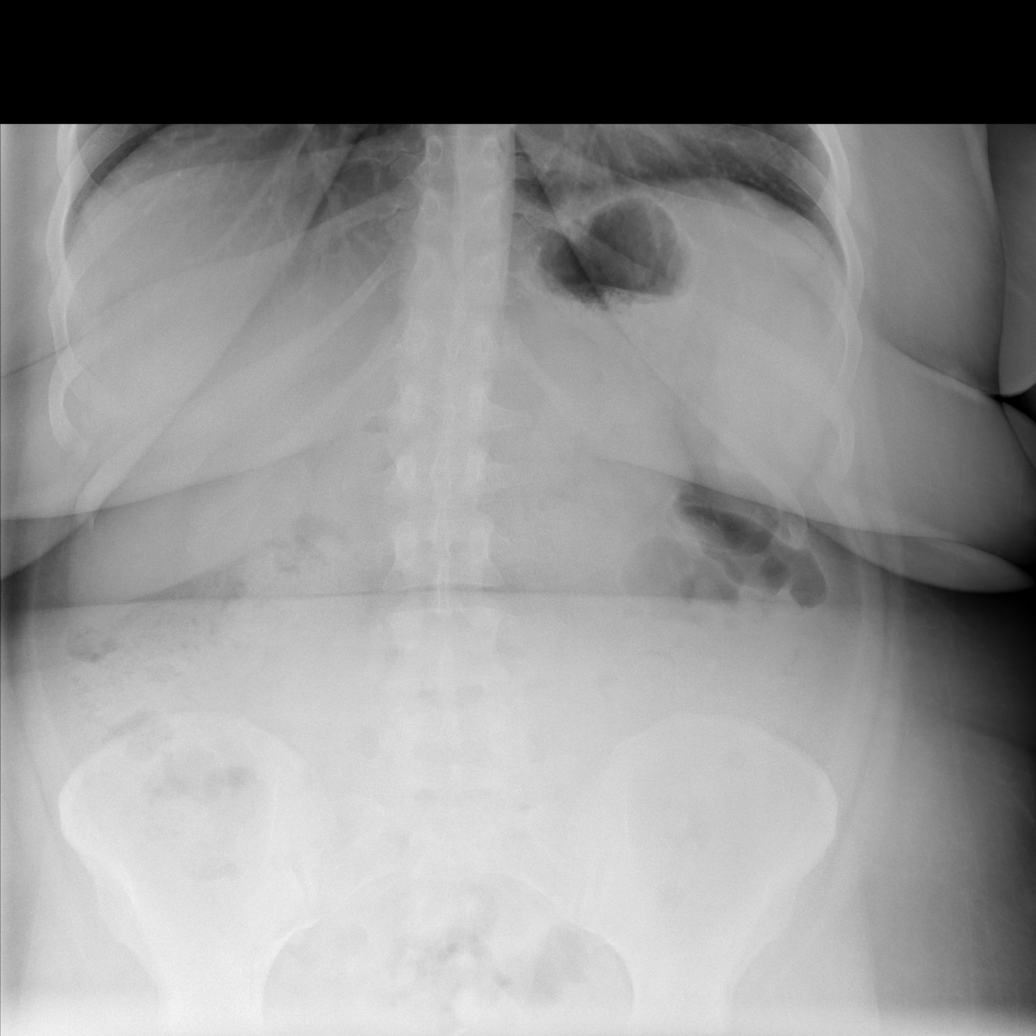

[t abdomen supine (1 of 2)]
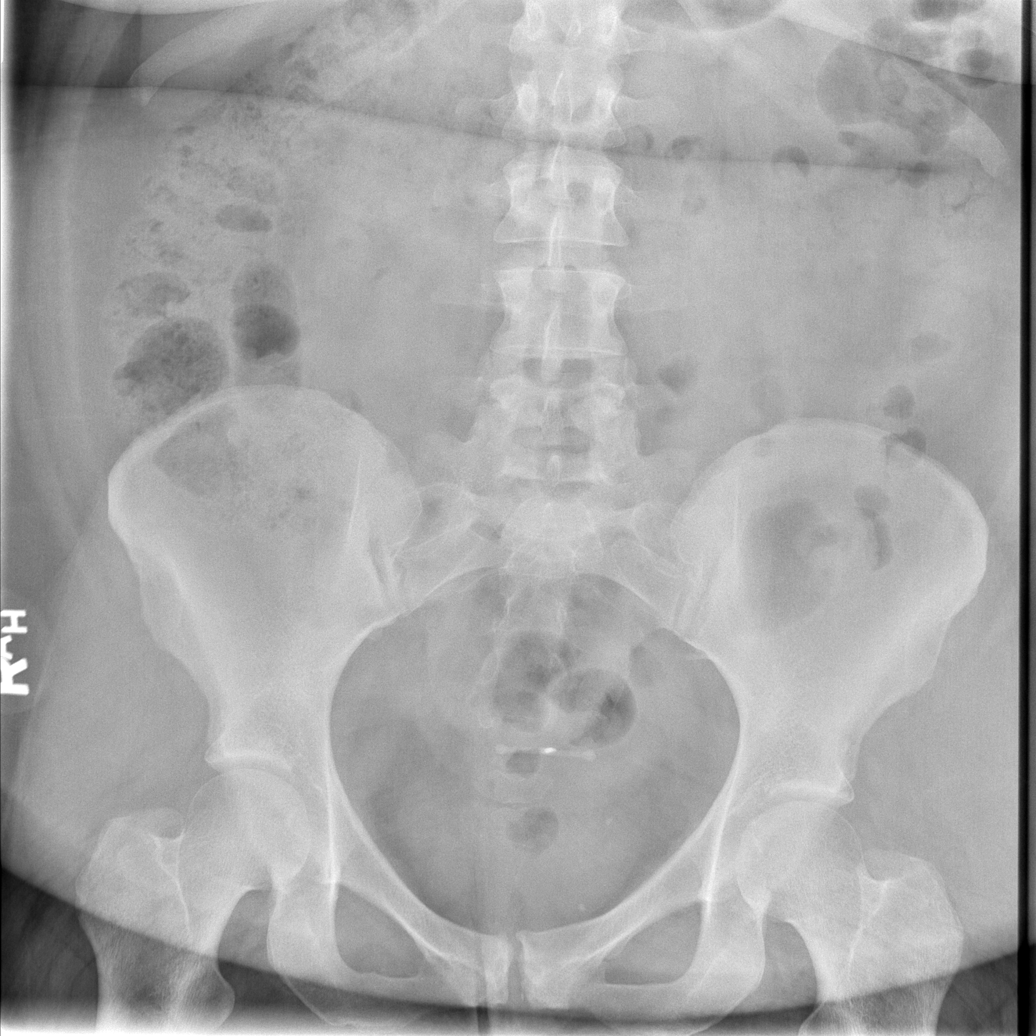

[t abdomen supine (2 of 2)]
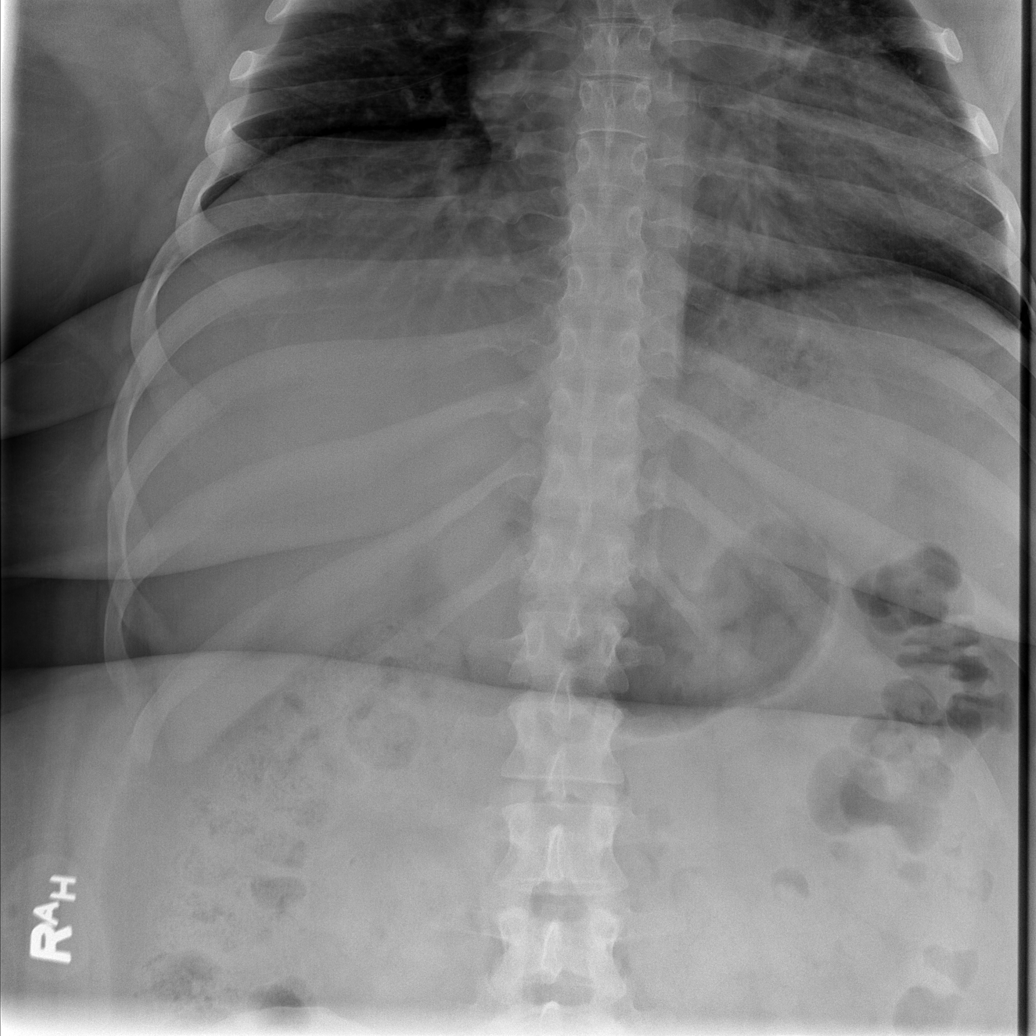

[4 of 4 positions shown; findings below may reference images not displayed]

FINDINGS: IUD noted within the pelvis.  The bowel gas pattern is
normal.  There is no evidence of free intraperitoneal air.  No
suspicious radio-opaque calculi or other significant radiographic
abnormality is seen. Heart size and mediastinal contours are within
normal limits.  Both lungs are clear.
IMPRESSION: No acute findings.

## 2014-03-28 IMAGING — US US ABDOMEN COMPLETE
1 series · 13 of 25 positions shown · non-contrast
Comparison: Plain films 12/01/2011.  Abdominal pelvic CT of
11/06/2010.  Prior ultrasound 10/02/2010.

CLINICAL DATA: Abdominal pain for 1 week with worsening last
night.  History of kidney stones and gastroesophageal reflux
disease.

COMPLETE ABDOMINAL ULTRASOUND

[Series 1: us abdomen complete · 0.39mm/px · 13 of 60 slices shown]
[im 1/60]
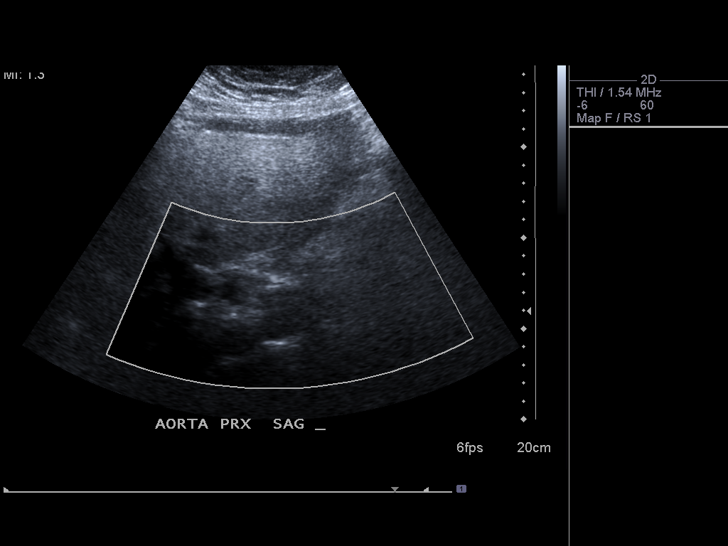
[im 5/60]
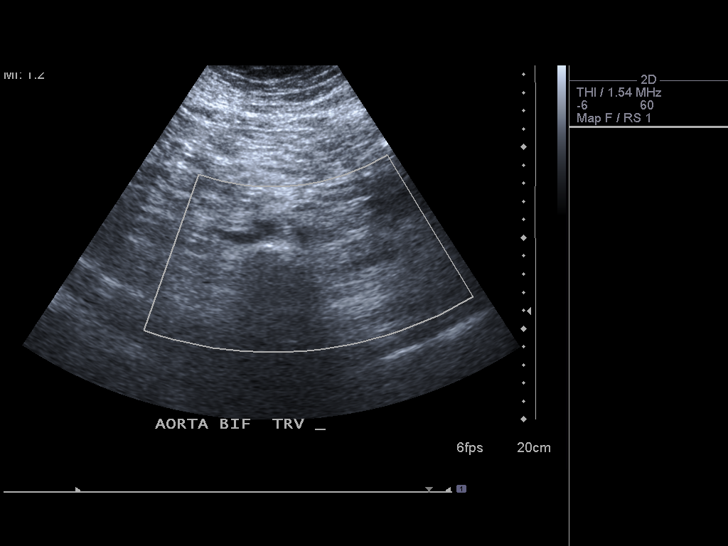
[im 10/60]
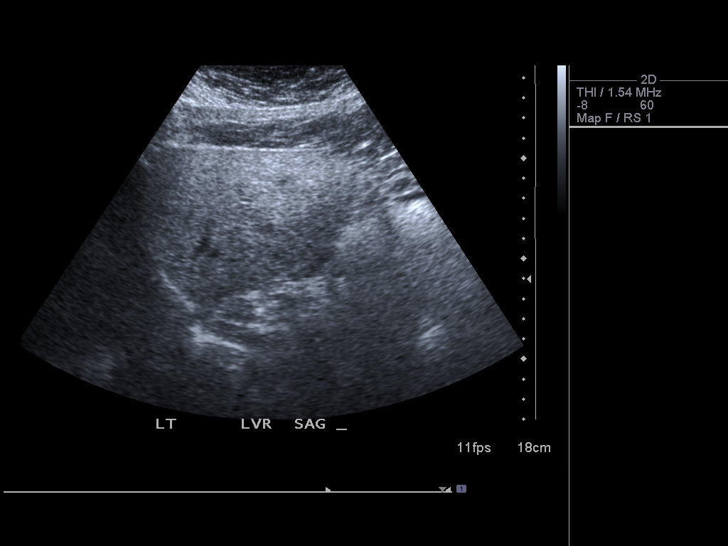
[im 15/60]
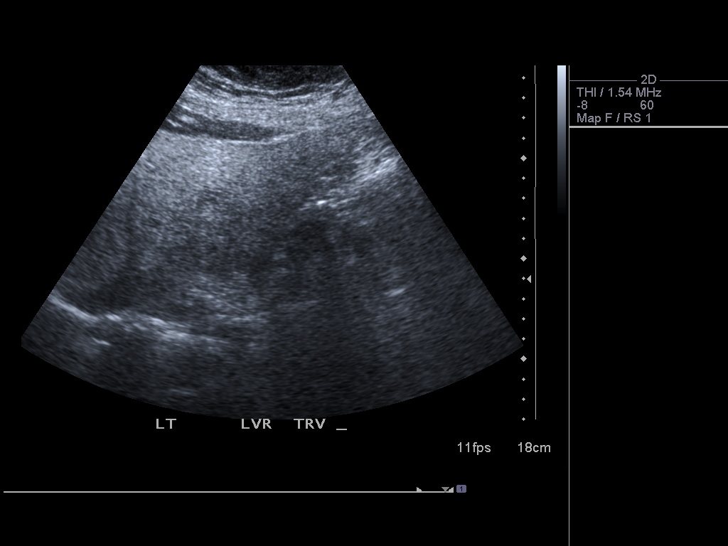
[im 20/60]
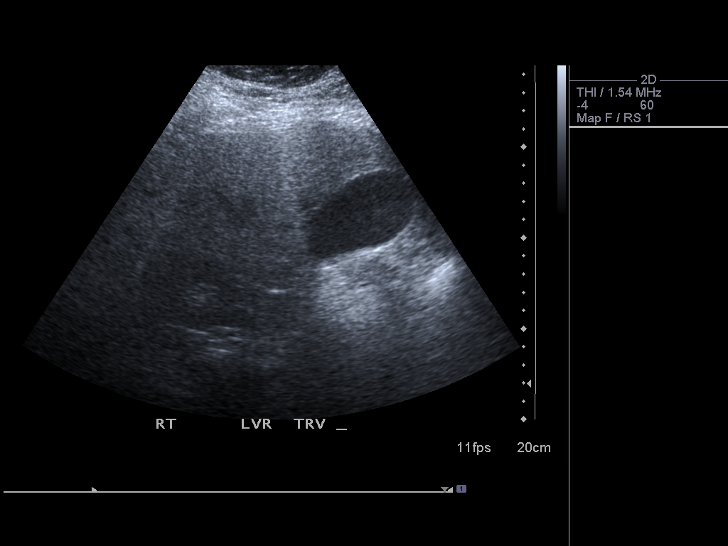
[im 25/60]
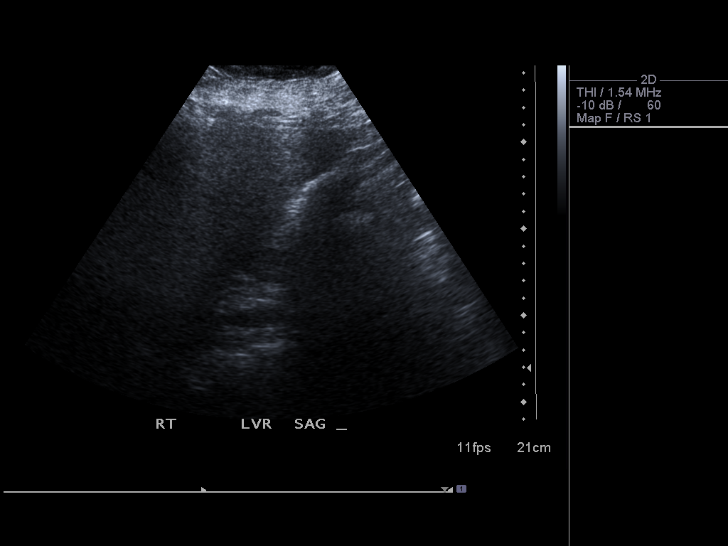
[im 30/60]
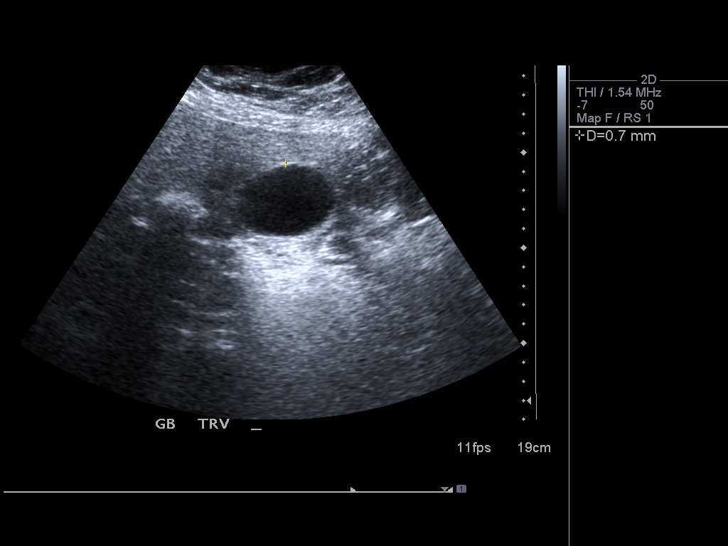
[im 35/60]
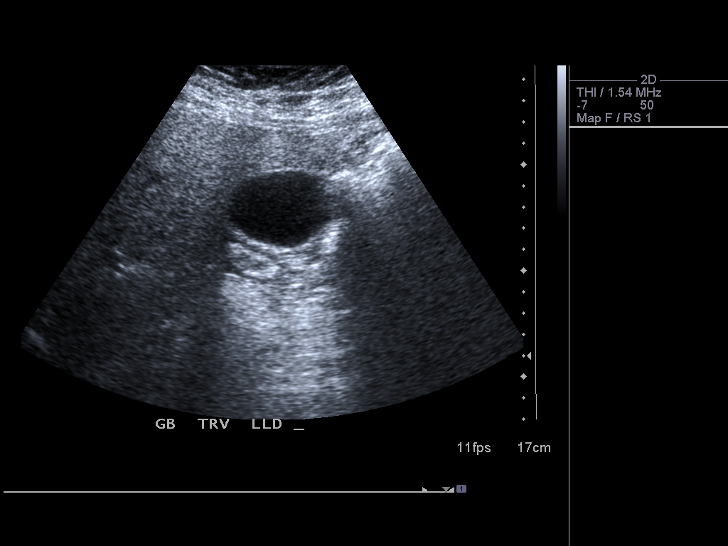
[im 40/60]
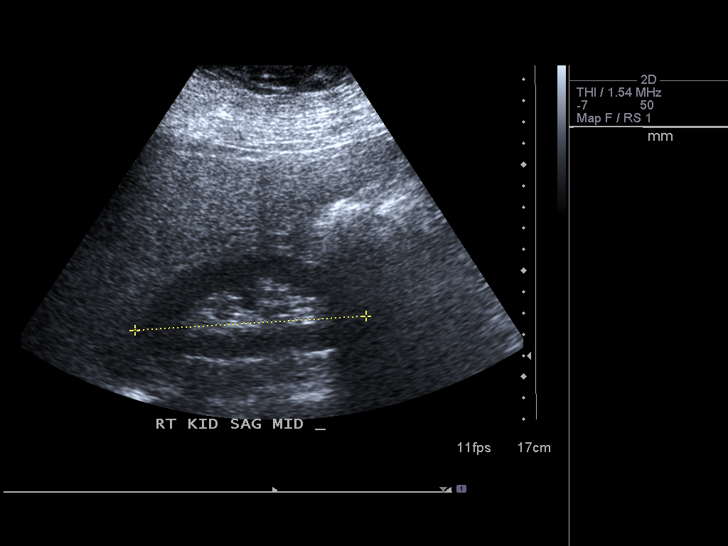
[im 45/60]
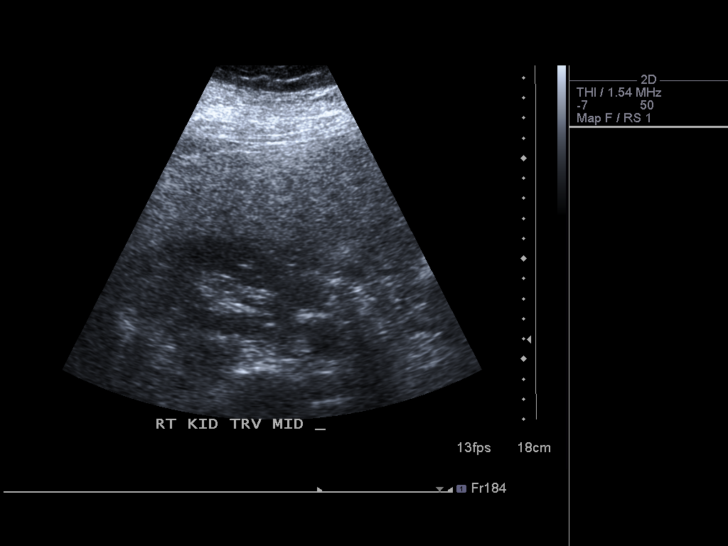
[im 50/60]
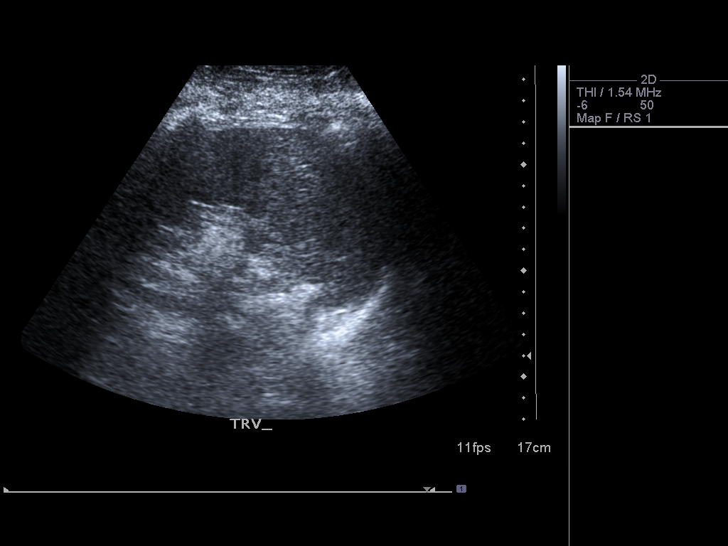
[im 55/60]
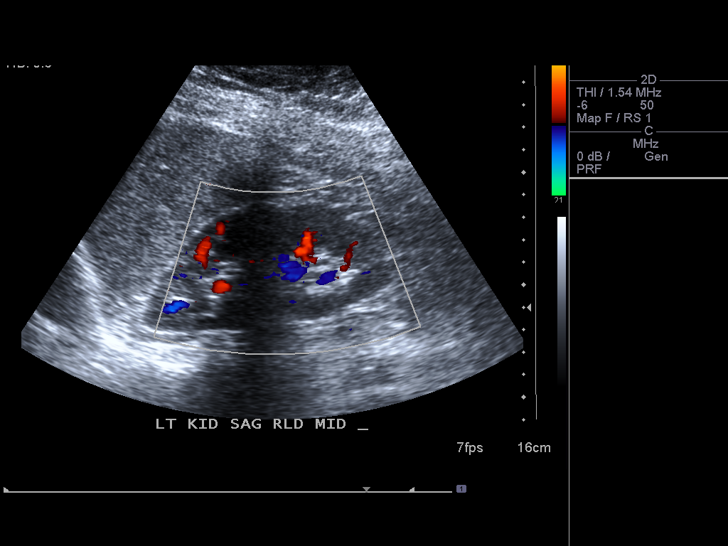
[im 60/60]
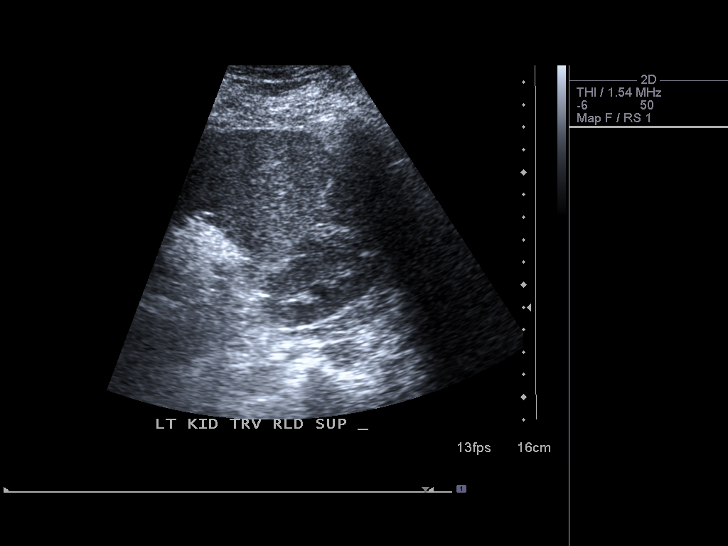

[13 of 25 positions shown; findings below may reference images not displayed]

FINDINGS: Gallbladder:  Minimal gallbladder sludge versus artifact on image
24.  No wall thickening or pericholecystic fluid. Sonographic
Murphy's sign was not elicited.

Common bile duct: Normal, 2 mm.

Liver: Increased echogenicity, consistent with steatosis.  Mild
sparing adjacent the gallbladder.

IVC: Negative

Pancreas:  Poorly visualized due to overlying bowel gas.

Spleen:  Normal in size and echogenicity.

Right Kidney:  10.9 cm. No hydronephrosis.

Left Kidney:  12.0 cm. No hydronephrosis.

Abdominal aorta:  Poorly visualized due to overlying bowel gas.  No
ascites..

Exam overall degraded by overlying bowel gas and patient body
habitus.
IMPRESSION: 1. Decreased sensitivity and specificity exam due to technique
related factors, as described above.
2.  Given these factors, no explanation for abdominal pain or acute
process.
3.  Hepatic steatosis.
4.  Possible small amount of gallbladder sludge suspected.

## 2014-04-04 ENCOUNTER — Other Ambulatory Visit: Payer: Self-pay | Admitting: Obstetrics & Gynecology

## 2015-05-09 ENCOUNTER — Other Ambulatory Visit: Payer: Self-pay | Admitting: Obstetrics & Gynecology

## 2015-05-09 ENCOUNTER — Other Ambulatory Visit (HOSPITAL_COMMUNITY)
Admission: RE | Admit: 2015-05-09 | Discharge: 2015-05-09 | Disposition: A | Discharge status: Home / Self Care | Payer: BLUE CROSS/BLUE SHIELD | Source: Ambulatory Visit | Attending: Obstetrics & Gynecology | Admitting: Obstetrics & Gynecology

## 2015-05-09 DIAGNOSIS — Z01411 Encounter for gynecological examination (general) (routine) with abnormal findings: Secondary | ICD-10-CM | POA: Insufficient documentation

## 2015-05-09 DIAGNOSIS — Z113 Encounter for screening for infections with a predominantly sexual mode of transmission: Secondary | ICD-10-CM | POA: Diagnosis present

## 2015-05-10 LAB — CYTOLOGY - PAP

## 2015-06-12 ENCOUNTER — Emergency Department (HOSPITAL_COMMUNITY): Payer: BLUE CROSS/BLUE SHIELD

## 2015-06-12 ENCOUNTER — Encounter (HOSPITAL_COMMUNITY): Payer: Self-pay

## 2015-06-12 ENCOUNTER — Observation Stay (HOSPITAL_COMMUNITY)
Admission: EM | Admit: 2015-06-12 | Discharge: 2015-06-14 | Disposition: A | Discharge status: Home / Self Care | Payer: BLUE CROSS/BLUE SHIELD | Attending: Family Medicine | Admitting: Family Medicine

## 2015-06-12 DIAGNOSIS — R1110 Vomiting, unspecified: Secondary | ICD-10-CM | POA: Diagnosis not present

## 2015-06-12 DIAGNOSIS — R109 Unspecified abdominal pain: Secondary | ICD-10-CM | POA: Diagnosis not present

## 2015-06-12 DIAGNOSIS — R1084 Generalized abdominal pain: Secondary | ICD-10-CM | POA: Diagnosis present

## 2015-06-12 DIAGNOSIS — Z6841 Body Mass Index (BMI) 40.0 and over, adult: Secondary | ICD-10-CM | POA: Diagnosis not present

## 2015-06-12 DIAGNOSIS — N760 Acute vaginitis: Secondary | ICD-10-CM | POA: Insufficient documentation

## 2015-06-12 DIAGNOSIS — K760 Fatty (change of) liver, not elsewhere classified: Secondary | ICD-10-CM | POA: Diagnosis present

## 2015-06-12 DIAGNOSIS — B9689 Other specified bacterial agents as the cause of diseases classified elsewhere: Secondary | ICD-10-CM | POA: Diagnosis present

## 2015-06-12 DIAGNOSIS — K219 Gastro-esophageal reflux disease without esophagitis: Secondary | ICD-10-CM | POA: Diagnosis not present

## 2015-06-12 DIAGNOSIS — J4520 Mild intermittent asthma, uncomplicated: Secondary | ICD-10-CM

## 2015-06-12 DIAGNOSIS — J45909 Unspecified asthma, uncomplicated: Secondary | ICD-10-CM | POA: Diagnosis not present

## 2015-06-12 DIAGNOSIS — R112 Nausea with vomiting, unspecified: Secondary | ICD-10-CM | POA: Diagnosis not present

## 2015-06-12 DIAGNOSIS — R911 Solitary pulmonary nodule: Secondary | ICD-10-CM | POA: Diagnosis not present

## 2015-06-12 DIAGNOSIS — D72829 Elevated white blood cell count, unspecified: Secondary | ICD-10-CM | POA: Diagnosis present

## 2015-06-12 DIAGNOSIS — E860 Dehydration: Secondary | ICD-10-CM | POA: Diagnosis present

## 2015-06-12 DIAGNOSIS — B999 Unspecified infectious disease: Secondary | ICD-10-CM | POA: Diagnosis present

## 2015-06-12 HISTORY — DX: Unspecified chronic bronchitis: J42

## 2015-06-12 HISTORY — DX: Other specified behavioral and emotional disorders with onset usually occurring in childhood and adolescence: F98.8

## 2015-06-12 HISTORY — DX: Sleep apnea, unspecified: G47.30

## 2015-06-12 HISTORY — DX: Anemia, unspecified: D64.9

## 2015-06-12 HISTORY — DX: Cluster headache syndrome, unspecified, not intractable: G44.009

## 2015-06-12 LAB — CBC
HEMATOCRIT: 44 % (ref 36.0–46.0)
Hemoglobin: 15 g/dL (ref 12.0–15.0)
MCH: 28.6 pg (ref 26.0–34.0)
MCHC: 34.1 g/dL (ref 30.0–36.0)
MCV: 84 fL (ref 78.0–100.0)
PLATELETS: 312 10*3/uL (ref 150–400)
RBC: 5.24 MIL/uL — ABNORMAL HIGH (ref 3.87–5.11)
RDW: 14.3 % (ref 11.5–15.5)
WBC: 19.2 10*3/uL — ABNORMAL HIGH (ref 4.0–10.5)

## 2015-06-12 LAB — COMPREHENSIVE METABOLIC PANEL
ALBUMIN: 4.4 g/dL (ref 3.5–5.0)
ALK PHOS: 88 U/L (ref 38–126)
ALT: 49 U/L (ref 14–54)
AST: 39 U/L (ref 15–41)
Anion gap: 10 (ref 5–15)
BILIRUBIN TOTAL: 0.7 mg/dL (ref 0.3–1.2)
BUN: 17 mg/dL (ref 6–20)
CO2: 22 mmol/L (ref 22–32)
CREATININE: 0.73 mg/dL (ref 0.44–1.00)
Calcium: 10.1 mg/dL (ref 8.9–10.3)
Chloride: 104 mmol/L (ref 101–111)
GFR calc Af Amer: >60 mL/min (ref >60)
GLUCOSE: 109 mg/dL — AB (ref 65–99)
Potassium: 3.8 mmol/L (ref 3.5–5.1)
Sodium: 136 mmol/L (ref 135–145)
TOTAL PROTEIN: 8.3 g/dL — AB (ref 6.5–8.1)

## 2015-06-12 LAB — DIFFERENTIAL
Basophils Absolute: 0 10*3/uL (ref 0.0–0.1)
Basophils Relative: 0 %
EOS PCT: 0 %
Eosinophils Absolute: 0 10*3/uL (ref 0.0–0.7)
LYMPHS PCT: 9 %
Lymphs Abs: 1.5 10*3/uL (ref 0.7–4.0)
Monocytes Absolute: 0.6 10*3/uL (ref 0.1–1.0)
Monocytes Relative: 4 %
NEUTROS ABS: 13.7 10*3/uL — AB (ref 1.7–7.7)
NEUTROS PCT: 87 %

## 2015-06-12 LAB — WET PREP, GENITAL
SPERM: NONE SEEN
TRICH WET PREP: NONE SEEN
Yeast Wet Prep HPF POC: NONE SEEN

## 2015-06-12 LAB — C-REACTIVE PROTEIN: CRP: 1 mg/dL — AB (ref <1.0)

## 2015-06-12 LAB — LIPASE, BLOOD: Lipase: 16 U/L (ref 11–51)

## 2015-06-12 LAB — LACTIC ACID, PLASMA
Lactic Acid, Venous: 1.5 mmol/L (ref 0.5–2.0)
Lactic Acid, Venous: 1.6 mmol/L (ref 0.5–2.0)

## 2015-06-12 LAB — I-STAT BETA HCG BLOOD, ED (MC, WL, AP ONLY)

## 2015-06-12 LAB — SEDIMENTATION RATE: SED RATE: 29 mm/h — AB (ref 0–22)

## 2015-06-12 MED ORDER — SODIUM CHLORIDE 0.9 % IV SOLN
1000.0000 mL | Freq: Once | INTRAVENOUS | Status: AC
  Administered 2015-06-12: 1000 mL via INTRAVENOUS

## 2015-06-12 MED ORDER — TRAMADOL HCL 50 MG PO TABS: 50.0000 mg | ORAL_TABLET | Freq: Two times a day (BID) | ORAL | Status: DC | PRN

## 2015-06-12 MED ORDER — SODIUM CHLORIDE 0.9 % IV SOLN
INTRAVENOUS | Status: AC
  Administered 2015-06-12: 150 mL/h via INTRAVENOUS

## 2015-06-12 MED ORDER — FLUTICASONE PROPIONATE 50 MCG/ACT NA SUSP
1.0000 | Freq: Every day | NASAL | Status: DC
  Filled 2015-06-12: qty 16

## 2015-06-12 MED ORDER — SODIUM CHLORIDE 0.9 % IV SOLN
1000.0000 mL | INTRAVENOUS | Status: DC
  Administered 2015-06-12 – 2015-06-13 (×2): 1000 mL via INTRAVENOUS

## 2015-06-12 MED ORDER — DEXLANSOPRAZOLE 30 MG PO CPDR
30.0000 mg | DELAYED_RELEASE_CAPSULE | Freq: Every day | ORAL | Status: DC
  Administered 2015-06-12 – 2015-06-13 (×2): 30 mg via ORAL
  Filled 2015-06-12 (×2): qty 1

## 2015-06-12 MED ORDER — ACETAMINOPHEN 650 MG RE SUPP: 650.0000 mg | Freq: Four times a day (QID) | RECTAL | Status: DC | PRN

## 2015-06-12 MED ORDER — HYDROMORPHONE HCL 1 MG/ML IJ SOLN
0.5000 mg | INTRAMUSCULAR | Status: DC | PRN
  Administered 2015-06-12 – 2015-06-13 (×2): 0.5 mg via INTRAVENOUS
  Filled 2015-06-12 (×2): qty 1

## 2015-06-12 MED ORDER — ONDANSETRON HCL 4 MG/2ML IJ SOLN
4.0000 mg | Freq: Once | INTRAMUSCULAR | Status: AC
  Administered 2015-06-12: 4 mg via INTRAVENOUS
  Filled 2015-06-12: qty 2

## 2015-06-12 MED ORDER — NON FORMULARY: 30.0000 mg | Freq: Every day | Status: DC

## 2015-06-12 MED ORDER — ONDANSETRON HCL 4 MG/2ML IJ SOLN
4.0000 mg | Freq: Four times a day (QID) | INTRAMUSCULAR | Status: DC | PRN
Start: 2015-06-12 — End: 2015-06-14
  Administered 2015-06-13: 4 mg via INTRAVENOUS
  Filled 2015-06-12: qty 2

## 2015-06-12 MED ORDER — MORPHINE SULFATE (PF) 2 MG/ML IV SOLN
1.0000 mg | INTRAVENOUS | Status: DC | PRN
  Administered 2015-06-12: 2 mg via INTRAVENOUS
  Filled 2015-06-12: qty 1

## 2015-06-12 MED ORDER — ALBUTEROL SULFATE (2.5 MG/3ML) 0.083% IN NEBU: 3.0000 mL | INHALATION_SOLUTION | Freq: Four times a day (QID) | RESPIRATORY_TRACT | Status: DC | PRN

## 2015-06-12 MED ORDER — HYDROMORPHONE HCL 1 MG/ML IJ SOLN
1.0000 mg | Freq: Once | INTRAMUSCULAR | Status: AC
  Administered 2015-06-12: 1 mg via INTRAVENOUS
  Filled 2015-06-12: qty 1

## 2015-06-12 MED ORDER — ENOXAPARIN SODIUM 40 MG/0.4ML ~~LOC~~ SOLN: 40.0000 mg | SUBCUTANEOUS | Status: DC

## 2015-06-12 MED ORDER — IOPAMIDOL (ISOVUE-300) INJECTION 61%
INTRAVENOUS | Status: AC
  Administered 2015-06-12: 100 mL
  Filled 2015-06-12: qty 100

## 2015-06-12 MED ORDER — ACETAMINOPHEN 325 MG PO TABS
650.0000 mg | ORAL_TABLET | Freq: Four times a day (QID) | ORAL | Status: DC | PRN
  Administered 2015-06-13 – 2015-06-14 (×2): 650 mg via ORAL
  Filled 2015-06-12 (×2): qty 2

## 2015-06-12 MED ORDER — METRONIDAZOLE IN NACL 5-0.79 MG/ML-% IV SOLN
500.0000 mg | Freq: Three times a day (TID) | INTRAVENOUS | Status: DC
  Administered 2015-06-12 – 2015-06-13 (×3): 500 mg via INTRAVENOUS
  Filled 2015-06-12 (×3): qty 100

## 2015-06-12 MED ORDER — ENOXAPARIN SODIUM 60 MG/0.6ML ~~LOC~~ SOLN
60.0000 mg | SUBCUTANEOUS | Status: DC
  Filled 2015-06-12: qty 0.6

## 2015-06-12 MED ORDER — PANTOPRAZOLE SODIUM 40 MG PO TBEC: 40.0000 mg | DELAYED_RELEASE_TABLET | Freq: Every day | ORAL | Status: DC

## 2015-06-12 NOTE — ED Notes (Signed)
Upon this RN entering the room, patient appeared to be shivering, patient's temperature was found to be 98.3, pt reports she does not feel cold. Dr. Donnald GarrePfeiffer made aware and in to see patient.

## 2015-06-12 NOTE — ED Notes (Signed)
Patient reports that she awoke with mid abdominal pain this am with vomiting, denies diarrhea. [facility]

## 2015-06-12 NOTE — H&P (Signed)
History and Physical    Claudia Walsh ZOX:[id number] DOB: May 06, 1980 DOA: 06/12/2015   PCP: Katy Apo, MD   Patient coming from/Resides with: Private residence/lives with her children and her roommate  Chief Complaint: Intractable nausea and vomiting with colicky abdominal pain  HPI: Claudia Walsh is a 35 y.o. female with medical history significant for chronic cholecystitis subsequent cholecystectomy, severe reflux, asthma, history of prior small bowel obstruction, history of IUD presented to the ER with reports of abrupt onset of nausea and vomiting this a.m. Patient had repeated episodes of vomiting of food debris, bile and now the emesis is yellow. No blood. He is also had crampy colicky upper abdominal bandlike pain. She denies diarrhea since onset of symptoms. She also reports concerns over getting sick "every 2-3 weeks" since January. She does have small child as well as teenagers in the home and also works outside the home.  ED Course:  PO temp 98.1-BP 125/73-pulse 89-respirations 18-RA saturations 99% CT abdomen and pelvis with contrast: Stable right middle lobe 3 mm pulmonary nodule, diffuse hepatic steatosis with an associated hypodense 0.4 cm liver lesion in the anterior liver not seen on prior unenhanced chest CT into small too characterize; no bowel obstruction, diverticulitis, no small bowel wall thickening and no colitis Lab data: Sodium 136, potassium 3.8, BUN 17, creatinine 0.73, glucose 109, LFTs are normal, WBCs 19,200 differential not obtained, hemoglobin 15.0, platelets 312,000, i-STAT hCG less than 5.0 Zofran 4 mg IV 1 Dilaudid 1 mg IV 1 3 L normal saline bolus  Review of Systems:  In addition to the HPI above,  No Fever-chills, myalgias or other constitutional symptoms No Headache, changes with Vision or hearing, new weakness, tingling, numbness in any extremity, No problems swallowing food or Liquids, indigestion/reflux No Chest pain, Cough or  Shortness of Breath, palpitations, orthopnea or DOE No melena or hematochezia, no dark tarry stools No dysuria, hematuria or flank pain No new skin rashes, lesions, masses or bruises, No new joints pains-aches No recent weight gain or loss No polyuria, polydypsia or polyphagia,   Past Medical History  Diagnosis Date  . Kidney stone   . GERD (gastroesophageal reflux disease)   . Kidney stone   . Small bowel obstruction ([facility]CC)   . Asthma   . Anxiety   . Depression     states off all meds  . Depression   . H/O hiatal hernia   . Headache(784.0)   . Cough with sputum 12/08/11    productive green phlegm in AM, clear nasal drainage with sinus congestion  . Morbid obesity [facility])     Past Surgical History  Procedure Laterality Date  . Cesarean section    . Wisdom tooth extraction    . Tympanostomy tube placement    . Upper gastrointestinal endoscopy    . Cholecystectomy  12/12/2011    Procedure: LAPAROSCOPIC CHOLECYSTECTOMY WITH INTRAOPERATIVE CHOLANGIOGRAM;  Surgeon: Wilmon Arms. Corliss Skains, MD;  Location: [facility];  Service: General;  Laterality: N/A;     reports that she has never smoked. She has never used smokeless tobacco. She reports that she drinks alcohol. She reports that she uses illicit drugs (Marijuana).  Mobility: Without assistive devices Work history: Works in a bank   Allergies  Allergen Reactions  . Depo-Provera [Medroxyprogesterone]     Weight gain  . Nuvaring [Etonogestrel-Ethinyl Estradiol]     Rash on face,mood swings  . Ortho-Cyclen [Norgestimate-Eth Estradiol]   . Penicillins Rash    Family history reviewed;No significant chronic  problems but patient reports that in February her mother's bowels became "twisted up" and she required emergency surgery  Prior to Admission medications   Medication Sig Start Date End Date Taking? Authorizing Provider  albuterol (PROVENTIL HFA;VENTOLIN HFA) 108 (90 BASE) MCG/ACT inhaler Inhale 2 puffs into the lungs every 6 (six)  hours as needed. For shortness of breath.   Yes Historical Provider, MD  Chlorphen-Pseudoephed-APAP (THERAFLU FLU/COLD PO) Take 4 oz by mouth as needed. Cold symptoms   Yes Historical Provider, MD  DEXILANT 30 MG capsule Take 1 capsule by mouth daily. 04/30/15  Yes Historical Provider, MD  fluticasone (FLONASE) 50 MCG/ACT nasal spray Place 1 spray into both nostrils daily.   Yes Historical Provider, MD  guaiFENesin-codeine 100-10 MG/5ML syrup Take 10 mLs by mouth every 4 (four) hours as needed. 06/11/15  Yes Historical Provider, MD  ibuprofen (ADVIL,MOTRIN) 200 MG tablet Take 800 mg by mouth every 8 (eight) hours as needed (pain). For pain   Yes Historical Provider, MD  Multiple Vitamins-Minerals (MULTIVITAMIN WOMEN PO) Take 1 tablet by mouth daily.   Yes Historical Provider, MD    Physical Exam: Filed Vitals:   06/12/15 1429 06/12/15 1500 06/12/15 1545 06/12/15 1600  BP: 97/56 95/54 105/75 113/71  Pulse: 82 68 87 66  Temp:      TempSrc:      Resp: 18     Height:      Weight:      SpO2: 98% 99% 100% 100%      Constitutional: NAD, calm, comfortable Eyes: PERRL, lids and conjunctivae normal ENMT: Mucous membranes are moist. Posterior pharynx clear of any exudate or lesions.Normal dentition.  Neck: normal, supple, no masses, no thyromegaly Respiratory: clear to auscultation bilaterally, no wheezing, no crackles. Normal respiratory effort. No accessory muscle use.  Cardiovascular: Regular rate and rhythm, no murmurs / rubs / gallops. No extremity edema. 2+ pedal pulses. No carotid bruits.  Abdomen: not tender to palpation but prior to exam patient had another episode of colicky upper abdominal pain, no masses palpated. No hepatosplenomegaly. Bowel sounds positive but are hypoactive.  Musculoskeletal: no clubbing / cyanosis. No joint deformity upper and lower extremities. Good ROM, no contractures. Normal muscle tone.  Skin: no rashes, lesions, ulcers. No induration Neurologic: CN 2-12  grossly intact. Sensation intact, DTR normal. Strength 5/5 x all 4 extremities.  Psychiatric: Normal judgment and insight. Alert and oriented x 3. Normal mood.    Labs on Admission: I have personally reviewed following labs and imaging studies  CBC:  Recent Labs Lab 06/12/15 1051  WBC 19.2*  HGB 15.0  HCT 44.0  MCV 84.0  PLT 312   Basic Metabolic Panel:  Recent Labs Lab 06/12/15 1051  NA 136  K 3.8  CL 104  CO2 22  GLUCOSE 109*  BUN 17  CREATININE 0.73  CALCIUM 10.1   GFR: Estimated Creatinine Clearance: 133.2 mL/min (by C-G formula based on Cr of 0.73). Liver Function Tests:  Recent Labs Lab 06/12/15 1051  AST 39  ALT 49  ALKPHOS 88  BILITOT 0.7  PROT 8.3*  ALBUMIN 4.4    Recent Labs Lab 06/12/15 1051  LIPASE 16   No results for input(s): AMMONIA in the last 168 hours. Coagulation Profile: No results for input(s): INR, PROTIME in the last 168 hours. Cardiac Enzymes: No results for input(s): CKTOTAL, CKMB, CKMBINDEX, TROPONINI in the last 168 hours. BNP (last 3 results) No results for input(s): PROBNP in the last 8760 hours. HbA1C: No results  for input(s): HGBA1C in the last 72 hours. CBG: No results for input(s): GLUCAP in the last 168 hours. Lipid Profile: No results for input(s): CHOL, HDL, LDLCALC, TRIG, CHOLHDL, LDLDIRECT in the last 72 hours. Thyroid Function Tests: No results for input(s): TSH, T4TOTAL, FREET4, T3FREE, THYROIDAB in the last 72 hours. Anemia Panel: No results for input(s): VITAMINB12, FOLATE, FERRITIN, TIBC, IRON, RETICCTPCT in the last 72 hours. Urine analysis:    Component Value Date/Time   COLORURINE YELLOW 06/04/2012 2101   APPEARANCEUR CLEAR 06/04/2012 2101   LABSPEC 1.036* 06/04/2012 2101   PHURINE 5.5 06/04/2012 2101   GLUCOSEU NEGATIVE 06/04/2012 2101   HGBUR NEGATIVE 06/04/2012 2101   BILIRUBINUR NEGATIVE 06/04/2012 2101   KETONESUR NEGATIVE 06/04/2012 2101   PROTEINUR NEGATIVE 06/04/2012 2101    UROBILINOGEN 0.2 06/04/2012 2101   NITRITE NEGATIVE 06/04/2012 2101   LEUKOCYTESUR NEGATIVE 06/04/2012 2101   Sepsis Labs: @LABRCNTIP (procalcitonin:4,lacticidven:4) )No results found for this or any previous visit (from the past 240 hour(s)).   Radiological Exams on Admission: Ct Abdomen Pelvis W Contrast  06/12/2015  CLINICAL DATA:  Epigastric abdominal pain and nausea for 1 day. History of cholecystectomy and prior small bowel obstruction. EXAM: CT ABDOMEN AND PELVIS WITH CONTRAST TECHNIQUE: Multidetector CT imaging of the abdomen and pelvis was performed using the standard protocol following bolus administration of intravenous contrast. CONTRAST:  ISOVUE-300 IOPAMIDOL (ISOVUE-300) INJECTION 61% COMPARISON:  11/06/2010 CT abdomen/pelvis. FINDINGS: Lower chest: Superior right middle lobe 3 mm pulmonary nodule associated with the minor fissure (series 4/image 3), stable since 12/17/2011 chest CT angiogram and benign. Hepatobiliary: Diffuse hepatic steatosis. Hypodense 0.4 cm liver lesion in the anterior liver, not definitely seen on prior unenhanced chest CT, too small to characterize. Otherwise normal liver. Cholecystectomy. No biliary ductal dilatation. Pancreas: Normal, with no mass or duct dilation. Spleen: Normal size. No mass. Adrenals/Urinary Tract: Normal adrenals. No hydronephrosis. No renal masses. Persistent fetal lobulations in both kidneys, unchanged. Normal bladder. Stomach/Bowel: Grossly normal stomach. Normal caliber small bowel with no small bowel wall thickening. Normal appendix. Normal large bowel with no diverticulosis, large bowel wall thickening or pericolonic fat stranding. Vascular/Lymphatic: Normal caliber abdominal aorta. Patent portal, splenic, hepatic and renal veins. No pathologically enlarged lymph nodes in the abdomen or pelvis. Reproductive: Grossly normal anteverted uterus. Intrauterine device appears grossly normal in position in the fundal endometrial cavity. No  adnexal mass. Other: No pneumoperitoneum, ascites or focal fluid collection. Musculoskeletal: No aggressive appearing focal osseous lesions. Minimal degenerative changes in the visualized thoracolumbar spine. IMPRESSION: 1. No acute abnormality. No evidence of bowel obstruction or acute bowel inflammation. Normal appendix . 2. Diffuse hepatic steatosis. Electronically Signed   By: Delbert Phenix M.D.   On: 06/12/2015 14:07    Assessment/Plan Principal Problem:   Intractable vomiting/Dehydration -Patient presents with abrupt onset of vomiting without diarrhea with associated abdominal pain (see below) -CT unremarkable -Baseline hemoglobin around 11.6 and currently hemoconcentrated with a hemoglobin of 15 -Has received 3 L of IV fluid and ER and will continue IV fluids after admission -Symptom management with anti-emetics -Suspect viral syndrome -NPO for now and consider clear liquids within 12 hours  Active Problems:   Colicky abdominal pain/Leukocytosis -This may be reflective of a viral syndrome but given reported history of symptoms that sound suggestive of either intussusception bowel torsion I will check a baseline lactic acid and repeat in 3 hours -CT scan reassuring -Again suspect abdominal pain related to viral etiology and will be self-limiting -Check urinalysis, urine culture and  blood culture -Pelvic exam completed by EDP with out any unusual findings, GC/chlamydia probe has been obtained as well as serum RPR -Morphine IV prn    Hepatic steatosis with new subcentimeter anterior liver lesion -Liver lesion is new compared to prior imaging but is subcentimeter and because of this difficult to characterize -No mention from radiologist whether need serial imaging to follow this lesion    Recurrent infections -Patient reports concerns over "being sick every 2-3 weeks" -We'll check HIV    Asthma -Currently not wheezing -Continue preadmission Proventil MDI and Flonase    GERD  (gastroesophageal reflux disease) -Currently denying reflux symptoms -Continue preadmission Dexilant    Lung nodule, solitary -Seen on CT scan and reported as stable when compared to previous CTs (last in 2013)      DVT prophylaxis: Lovenox Code Status: Full code Family Communication: Roommate at bedside, Nicholes StairsKahlah Harris Disposition Plan: Anticipate discharge back to previous home environment when medically stable Consults called: None Admission status: Observation/floor     ELLIS,ALLISON L. ANP-BC [facility] Pager [phone]   If 7PM-7AM, please contact night-coverage [email or web address] Password [email or web address]  06/12/2015, 4:33 PM

## 2015-06-12 NOTE — ED Notes (Signed)
Patient transported to CT 

## 2015-06-12 NOTE — ED Provider Notes (Addendum)
CSN: [id number]     Arrival date & time 06/12/15  1015 History   First MD Initiated Contact with Patient 06/12/15 1057     Chief Complaint  Patient presents with  . Abdominal Pain     (Consider location/radiation/quality/duration/timing/severity/associated sxs/prior Treatment) HPI Patient states she went to bed feeling well last night. At approximately 6 AM she woke with severe abdominal pain and then began vomiting. Pain is across the lower and mid abdomen. It comes in intense waves of cramping. She has not had fever that she knows of. No diarrhea. Patient reports she has similar pain before she had her gallbladder out. She also reports a history of small bowel obstruction approximate 5 years ago. She reports it was managed nonoperatively. She has vomited multiple times and has been unable to take in any kind of fluids.  Patient reports she had a normal annual pelvic exam a couple weeks ago. Past Medical History  Diagnosis Date  . Kidney stone   . GERD (gastroesophageal reflux disease)   . Kidney stone   . Small bowel obstruction (HCC)   . Asthma   . Anxiety   . Depression     states off all meds  . Depression   . H/O hiatal hernia   . Headache(784.0)   . Cough with sputum 12/08/11    productive green phlegm in AM, clear nasal drainage with sinus congestion  . Morbid obesity [facility])    Past Surgical History  Procedure Laterality Date  . Cesarean section    . Wisdom tooth extraction    . Tympanostomy tube placement    . Upper gastrointestinal endoscopy    . Cholecystectomy  12/12/2011    Procedure: LAPAROSCOPIC CHOLECYSTECTOMY WITH INTRAOPERATIVE CHOLANGIOGRAM;  Surgeon: Wilmon Arms. Corliss Skains, MD;  Location: [facility];  Service: General;  Laterality: N/A;   No family history on file. Social History  Substance Use Topics  . Smoking status: Never Smoker   . Smokeless tobacco: Never Used  . Alcohol Use: Yes     Comment: rare   OB History    No data available     Review of  Systems 10 Systems reviewed and are negative for acute change except as noted in the HPI.    Allergies  Depo-provera; Nuvaring; Ortho-cyclen; and Penicillins  Home Medications   Prior to Admission medications   Medication Sig Start Date End Date Taking? Authorizing Provider  albuterol (PROVENTIL HFA;VENTOLIN HFA) 108 (90 BASE) MCG/ACT inhaler Inhale 2 puffs into the lungs every 6 (six) hours as needed. For shortness of breath.   Yes Historical Provider, MD  Chlorphen-Pseudoephed-APAP (THERAFLU FLU/COLD PO) Take 4 oz by mouth as needed. Cold symptoms   Yes Historical Provider, MD  DEXILANT 30 MG capsule Take 1 capsule by mouth daily. 04/30/15  Yes Historical Provider, MD  fluticasone (FLONASE) 50 MCG/ACT nasal spray Place 1 spray into both nostrils daily.   Yes Historical Provider, MD  guaiFENesin-codeine 100-10 MG/5ML syrup Take 10 mLs by mouth every 4 (four) hours as needed. 06/11/15  Yes Historical Provider, MD  ibuprofen (ADVIL,MOTRIN) 200 MG tablet Take 800 mg by mouth every 8 (eight) hours as needed (pain). For pain   Yes Historical Provider, MD  Multiple Vitamins-Minerals (MULTIVITAMIN WOMEN PO) Take 1 tablet by mouth daily.   Yes Historical Provider, MD   BP 95/54 mmHg  Pulse 68  Temp(Src) 98.3 F (36.8 C) (Oral)  Resp 18  Ht  (1.626 m)  Wt 280 lb (127.007 kg)  BMI  48.04 kg/m2  SpO2 99% Physical Exam  Constitutional: She is oriented to person, place, and time.  Patient is alert. She appears to be in significant distress. She is in pain and diaphoretic. Patient has active vomiting.  HENT:  Head: Normocephalic and atraumatic.  Mouth/Throat: Oropharynx is clear and moist.  Eyes: EOM are normal. Pupils are equal, round, and reactive to light.  Cardiovascular: Normal rate, regular rhythm, normal heart sounds and intact distal pulses.   Pulmonary/Chest: Effort normal and breath sounds normal.  Abdominal: Soft.  Moderate to severe diffuse mid abdominal tenderness.   Genitourinary:  Normal external genitalia. No drainage or discharge from cervix. No cervical friability. IUD in place. Bimanual, patient is diffusely tender to palpation.  Musculoskeletal: Normal range of motion. She exhibits no edema or tenderness.  Neurological: She is alert and oriented to person, place, and time. She exhibits normal muscle tone. Coordination normal.  Skin:  Patient is diaphoretic.    ED Course  Procedures (including critical care time) Labs Review Labs Reviewed  COMPREHENSIVE METABOLIC PANEL - Abnormal; Notable for the following:    Glucose, Bld 109 (*)    Total Protein 8.3 (*)    All other components within normal limits  CBC - Abnormal; Notable for the following:    WBC 19.2 (*)    RBC 5.24 (*)    All other components within normal limits  URINE CULTURE  CULTURE, BLOOD (ROUTINE X 2)  CULTURE, BLOOD (ROUTINE X 2)  LIPASE, BLOOD  URINALYSIS, ROUTINE W REFLEX MICROSCOPIC (NOT AT [facility])  HIV ANTIBODY (ROUTINE TESTING)  I-STAT BETA HCG BLOOD, ED ([facility], [facility], [facility] ONLY)    Imaging Review Ct Abdomen Pelvis W Contrast  06/12/2015  CLINICAL DATA:  Epigastric abdominal pain and nausea for 1 day. History of cholecystectomy and prior small bowel obstruction. EXAM: CT ABDOMEN AND PELVIS WITH CONTRAST TECHNIQUE: Multidetector CT imaging of the abdomen and pelvis was performed using the standard protocol following bolus administration of intravenous contrast. CONTRAST:  ISOVUE-300 IOPAMIDOL (ISOVUE-300) INJECTION 61% COMPARISON:  11/06/2010 CT abdomen/pelvis. FINDINGS: Lower chest: Superior right middle lobe 3 mm pulmonary nodule associated with the minor fissure (series 4/image 3), stable since 12/17/2011 chest CT angiogram and benign. Hepatobiliary: Diffuse hepatic steatosis. Hypodense 0.4 cm liver lesion in the anterior liver, not definitely seen on prior unenhanced chest CT, too small to characterize. Otherwise normal liver. Cholecystectomy. No biliary ductal dilatation.  Pancreas: Normal, with no mass or duct dilation. Spleen: Normal size. No mass. Adrenals/Urinary Tract: Normal adrenals. No hydronephrosis. No renal masses. Persistent fetal lobulations in both kidneys, unchanged. Normal bladder. Stomach/Bowel: Grossly normal stomach. Normal caliber small bowel with no small bowel wall thickening. Normal appendix. Normal large bowel with no diverticulosis, large bowel wall thickening or pericolonic fat stranding. Vascular/Lymphatic: Normal caliber abdominal aorta. Patent portal, splenic, hepatic and renal veins. No pathologically enlarged lymph nodes in the abdomen or pelvis. Reproductive: Grossly normal anteverted uterus. Intrauterine device appears grossly normal in position in the fundal endometrial cavity. No adnexal mass. Other: No pneumoperitoneum, ascites or focal fluid collection. Musculoskeletal: No aggressive appearing focal osseous lesions. Minimal degenerative changes in the visualized thoracolumbar spine. IMPRESSION: 1. No acute abnormality. No evidence of bowel obstruction or acute bowel inflammation. Normal appendix . 2. Diffuse hepatic steatosis. Electronically Signed   By: Delbert Phenix M.D.   On: 06/12/2015 14:07   I have personally reviewed and evaluated these images and lab results as part of my medical decision-making.   EKG Interpretation None  MDM   Final diagnoses:  Intractable vomiting with nausea, vomiting of unspecified type  Generalized abdominal pain   Patient arrives severe recurrent vomiting. She is also experiencing central abdominal pain. Patient has history of bowel instruction. CT did not show evidence of bowel obstruction. She does have significant leukocytosis and at this time will be placed in observation for continued fluids and pain control.    Arby BarretteMarcy Morris Longenecker, MD 06/12/15 1549  Arby BarretteMarcy Lamarr Feenstra, MD 06/12/15 [phone]

## 2015-06-12 NOTE — ED Notes (Signed)
MD at bedside. 

## 2015-06-13 DIAGNOSIS — R1110 Vomiting, unspecified: Secondary | ICD-10-CM | POA: Diagnosis not present

## 2015-06-13 DIAGNOSIS — A499 Bacterial infection, unspecified: Secondary | ICD-10-CM

## 2015-06-13 DIAGNOSIS — N760 Acute vaginitis: Secondary | ICD-10-CM

## 2015-06-13 LAB — HIV ANTIBODY (ROUTINE TESTING W REFLEX): HIV Screen 4th Generation wRfx: NONREACTIVE

## 2015-06-13 LAB — BASIC METABOLIC PANEL
ANION GAP: 7 (ref 5–15)
BUN: 14 mg/dL (ref 6–20)
CALCIUM: 8.3 mg/dL — AB (ref 8.9–10.3)
CO2: 22 mmol/L (ref 22–32)
Chloride: 109 mmol/L (ref 101–111)
Creatinine, Ser: 0.54 mg/dL (ref 0.44–1.00)
Glucose, Bld: 77 mg/dL (ref 65–99)
POTASSIUM: 3.7 mmol/L (ref 3.5–5.1)
Sodium: 138 mmol/L (ref 135–145)

## 2015-06-13 LAB — URINALYSIS, ROUTINE W REFLEX MICROSCOPIC
Bilirubin Urine: NEGATIVE
Glucose, UA: NEGATIVE mg/dL
Hgb urine dipstick: NEGATIVE
KETONES UR: NEGATIVE mg/dL
LEUKOCYTES UA: NEGATIVE
NITRITE: NEGATIVE
PH: 6 (ref 5.0–8.0)
Protein, ur: NEGATIVE mg/dL
SPECIFIC GRAVITY, URINE: 1.036 — AB (ref 1.005–1.030)

## 2015-06-13 LAB — CBC
HEMATOCRIT: 35.4 % — AB (ref 36.0–46.0)
HEMOGLOBIN: 11.2 g/dL — AB (ref 12.0–15.0)
MCH: 27.2 pg (ref 26.0–34.0)
MCHC: 31.6 g/dL (ref 30.0–36.0)
MCV: 85.9 fL (ref 78.0–100.0)
Platelets: 196 10*3/uL (ref 150–400)
RBC: 4.12 MIL/uL (ref 3.87–5.11)
RDW: 14.8 % (ref 11.5–15.5)
WBC: 7.4 10*3/uL (ref 4.0–10.5)

## 2015-06-13 LAB — RPR: RPR: NONREACTIVE

## 2015-06-13 LAB — GC/CHLAMYDIA PROBE AMP (~~LOC~~) NOT AT ARMC
CHLAMYDIA, DNA PROBE: NEGATIVE
Neisseria Gonorrhea: NEGATIVE

## 2015-06-13 MED ORDER — METRONIDAZOLE 500 MG PO TABS
500.0000 mg | ORAL_TABLET | Freq: Two times a day (BID) | ORAL | Status: DC
  Administered 2015-06-13 – 2015-06-14 (×3): 500 mg via ORAL
  Filled 2015-06-13 (×3): qty 1

## 2015-06-13 MED ORDER — DEXLANSOPRAZOLE 30 MG PO CPDR
30.0000 mg | DELAYED_RELEASE_CAPSULE | Freq: Every day | ORAL | Status: DC
  Administered 2015-06-14: 30 mg via ORAL
  Filled 2015-06-13: qty 1

## 2015-06-13 MED ORDER — SUCRALFATE 1 GM/10ML PO SUSP
1.0000 g | Freq: Three times a day (TID) | ORAL | Status: DC
Start: 2015-06-13 — End: 2015-06-14
  Administered 2015-06-13 – 2015-06-14 (×3): 1 g via ORAL
  Filled 2015-06-13 (×3): qty 10

## 2015-06-13 MED ORDER — PANTOPRAZOLE SODIUM 40 MG PO TBEC: 40.0000 mg | DELAYED_RELEASE_TABLET | Freq: Two times a day (BID) | ORAL | Status: DC

## 2015-06-13 NOTE — Progress Notes (Signed)
PROGRESS NOTE    Claudia GrayerRachael M Walsh  NWG:[id number] DOB: Oct 17, 1980 DOA: 06/12/2015 PCP: Maye HidesMILLER, RYAN DEAN, PA  Outpatient Specialists:     Brief Narrative:  35 year old female History chronic cholecystitis status post laparoscopic cholecystectomy 2014 Morbid obesity, Body mass index is 49.34 kg/(m^2).--currently on a diet and has lost a lot of weight ? Bacterial vaginosis on admission  Admitted 06/12/2015 with episodic recurrent and persistent acute onset nausea vomiting after having a protein shake and broccoli at home No sick contacts otherwise No sick kids at home  CBC 19 Specific gravity elevated States that she had some reddish fluid in her vomitus   Assessment & Plan:   Principal Problem:   Intractable vomiting Active Problems:   Colicky abdominal pain   Leukocytosis   Recurrent infections   Dehydration   Asthma   GERD (gastroesophageal reflux disease)   Hepatic steatosis with new subcentimeter anterior liver lesion   Lung nodule, solitary   Intractable nausea and vomiting   Bacterial vaginosis   Etiology unclear however seems very more likely than not viral more so than organic pathology in her abdomen She has a history of an SBO in the past however her CT scan is completely negative Positional changes causes worsening of her pain leading me to think that this is subacute gastritis or irritation in her abdomen I will challenge her with a soft diet and by mouth liquids today If she has further pain and discomfort, we will asked GI to evaluate the upper GI tract as she might have created an ulcer with vomiting versus a Mallory-Weiss tear We will obtain CBC plus complete metabolic panel in a.m. I will cut her off of her IV saline right now Her LFTs are normal and I do not think there is any CBD pathology nor any other intra-abdominal pathology  She will need workup of her solitary lung nodule as an outpatient\  ? Bacterial vaginosis -Transition IV Flagyl  to by mouth Flagyl-usually can consolidate dose with 2000 mg to treat for BV 2 mg     DVT prophylaxis: Lovenox Code Status: Full Family Communication: NOne Disposition Plan: inpatient   Consultants:   None as yet  Procedures:   CT abdomen pelvis  Antimicrobials:   Flagyl    Subjective: Well hungry No cp Mild abd pain which is positional  Objective: Filed Vitals:   06/12/15 1643 06/12/15 1653 06/12/15 2154 06/13/15 0632  BP: 103/56  92/41 93/49  Pulse: 63  54 66  Temp: 97.9 F (36.6 C)  98.3 F (36.8 C) 97.7 F (36.5 C)  TempSrc: Oral  Oral Oral  Resp: 18  16 16   Height:  5\' 4"  (1.626 m)    Weight:  130.455 kg (287 lb 9.6 oz)    SpO2: 100%  99% 97%    Intake/Output Summary (Last 24 hours) at 06/13/15 1440 Last data filed at 06/13/15 0900  Gross per 24 hour  Intake 1942.92 ml  Output      0 ml  Net 1942.92 ml   Filed Weights   06/12/15 1033 06/12/15 1653  Weight: 127.007 kg (280 lb) 130.455 kg (287 lb 9.6 oz)    Examination:  General exam: Appears calm and comfortable  Respiratory system: Clear to auscultation. Respiratory effort normal. Cardiovascular system: S1 & S2 heard, RRR. No JVD, murmurs, rubs, gallops or clicks. No pedal edema. Gastrointestinal system: Abdomen is nondistended, soft and nontender. No organomegaly or masses felt. Normal bowel sounds heard. Central nervous system: Alert and oriented.  No focal neurological deficits. Extremities: Symmetric 5 x 5 power. Skin: No rashes, lesions or ulcers Psychiatry: Judgement and insight appear normal. Mood & affect appropriate.     Data Reviewed: I have personally reviewed following labs and imaging studies  CBC:  Recent Labs Lab 06/12/15 1051 06/12/15 1713 06/13/15 0541  WBC 19.2*  --  7.4  NEUTROABS  --  13.7*  --   HGB 15.0  --  11.2*  HCT 44.0  --  35.4*  MCV 84.0  --  85.9  PLT 312  --  196   Basic Metabolic Panel:  Recent Labs Lab 06/12/15 1051 06/13/15 0541  NA 136  138  K 3.8 3.7  CL 104 109  CO2 22 22  GLUCOSE 109* 77  BUN 17 14  CREATININE 0.73 0.54  CALCIUM 10.1 8.3*   GFR: Estimated Creatinine Clearance: 135.5 mL/min (by C-G formula based on Cr of 0.54). Liver Function Tests:  Recent Labs Lab 06/12/15 1051  AST 39  ALT 49  ALKPHOS 88  BILITOT 0.7  PROT 8.3*  ALBUMIN 4.4    Recent Labs Lab 06/12/15 1051  LIPASE 16   No results for input(s): AMMONIA in the last 168 hours. Coagulation Profile: No results for input(s): INR, PROTIME in the last 168 hours. Cardiac Enzymes: No results for input(s): CKTOTAL, CKMB, CKMBINDEX, TROPONINI in the last 168 hours. BNP (last 3 results) No results for input(s): PROBNP in the last 8760 hours. HbA1C: No results for input(s): HGBA1C in the last 72 hours. CBG: No results for input(s): GLUCAP in the last 168 hours. Lipid Profile: No results for input(s): CHOL, HDL, LDLCALC, TRIG, CHOLHDL, LDLDIRECT in the last 72 hours. Thyroid Function Tests: No results for input(s): TSH, T4TOTAL, FREET4, T3FREE, THYROIDAB in the last 72 hours. Anemia Panel: No results for input(s): VITAMINB12, FOLATE, FERRITIN, TIBC, IRON, RETICCTPCT in the last 72 hours. Urine analysis:    Component Value Date/Time   COLORURINE YELLOW 06/13/2015 0213   APPEARANCEUR CLEAR 06/13/2015 0213   LABSPEC 1.036* 06/13/2015 0213   PHURINE 6.0 06/13/2015 0213   GLUCOSEU NEGATIVE 06/13/2015 0213   HGBUR NEGATIVE 06/13/2015 0213   BILIRUBINUR NEGATIVE 06/13/2015 0213   KETONESUR NEGATIVE 06/13/2015 0213   PROTEINUR NEGATIVE 06/13/2015 0213   UROBILINOGEN 0.2 06/04/2012 2101   NITRITE NEGATIVE 06/13/2015 0213   LEUKOCYTESUR NEGATIVE 06/13/2015 0213   Sepsis Labs: (procalcitonin:4,lacticidven:4)  ) Recent Results (from the past 240 hour(s))  Wet prep, genital     Status: Abnormal   Collection Time: 06/12/15  3:51 PM  Result Value Ref Range Status   Yeast Wet Prep HPF POC NONE SEEN NONE SEEN Final   Trich, Wet  Prep NONE SEEN NONE SEEN Final   Clue Cells Wet Prep HPF POC PRESENT (A) NONE SEEN Final   WBC, Wet Prep HPF POC FEW (A) NONE SEEN Final   Sperm NONE SEEN  Final         Radiology Studies: Ct Abdomen Pelvis W Contrast  06/12/2015  CLINICAL DATA:  Epigastric abdominal pain and nausea for 1 day. History of cholecystectomy and prior small bowel obstruction. EXAM: CT ABDOMEN AND PELVIS WITH CONTRAST TECHNIQUE: Multidetector CT imaging of the abdomen and pelvis was performed using the standard protocol following bolus administration of intravenous contrast. CONTRAST:  ISOVUE-300 IOPAMIDOL (ISOVUE-300) INJECTION 61% COMPARISON:  11/06/2010 CT abdomen/pelvis. FINDINGS: Lower chest: Superior right middle lobe 3 mm pulmonary nodule associated with the minor fissure (series 4/image 3), stable since 12/17/2011 chest CT angiogram  and benign. Hepatobiliary: Diffuse hepatic steatosis. Hypodense 0.4 cm liver lesion in the anterior liver, not definitely seen on prior unenhanced chest CT, too small to characterize. Otherwise normal liver. Cholecystectomy. No biliary ductal dilatation. Pancreas: Normal, with no mass or duct dilation. Spleen: Normal size. No mass. Adrenals/Urinary Tract: Normal adrenals. No hydronephrosis. No renal masses. Persistent fetal lobulations in both kidneys, unchanged. Normal bladder. Stomach/Bowel: Grossly normal stomach. Normal caliber small bowel with no small bowel wall thickening. Normal appendix. Normal large bowel with no diverticulosis, large bowel wall thickening or pericolonic fat stranding. Vascular/Lymphatic: Normal caliber abdominal aorta. Patent portal, splenic, hepatic and renal veins. No pathologically enlarged lymph nodes in the abdomen or pelvis. Reproductive: Grossly normal anteverted uterus. Intrauterine device appears grossly normal in position in the fundal endometrial cavity. No adnexal mass. Other: No pneumoperitoneum, ascites or focal fluid collection.  Musculoskeletal: No aggressive appearing focal osseous lesions. Minimal degenerative changes in the visualized thoracolumbar spine. IMPRESSION: 1. No acute abnormality. No evidence of bowel obstruction or acute bowel inflammation. Normal appendix . 2. Diffuse hepatic steatosis. Electronically Signed   By: Delbert Phenix M.D.   On: 06/12/2015 14:07        Scheduled Meds: . Dexlansoprazole  30 mg Oral Daily  . enoxaparin (LOVENOX) injection  60 mg Subcutaneous Q24H  . fluticasone  1 spray Each Nare Daily  . metronidazole  500 mg Intravenous Q8H   Continuous Infusions:       Time spent: 52    Pleas Koch, MD [facility] ([facility]   If 7PM-7AM, please contact night-coverage [email or web address] Password [email or web address] 06/13/2015, 2:40 PM

## 2015-06-14 DIAGNOSIS — A499 Bacterial infection, unspecified: Secondary | ICD-10-CM | POA: Diagnosis not present

## 2015-06-14 DIAGNOSIS — R1110 Vomiting, unspecified: Secondary | ICD-10-CM | POA: Diagnosis not present

## 2015-06-14 DIAGNOSIS — N760 Acute vaginitis: Secondary | ICD-10-CM | POA: Diagnosis not present

## 2015-06-14 LAB — URINE CULTURE

## 2015-06-14 MED ORDER — CALCIUM CARBONATE ANTACID 600 MG PO CHEW: 600.0000 mg | CHEWABLE_TABLET | Freq: Two times a day (BID) | ORAL | Status: DC

## 2015-06-14 MED ORDER — METRONIDAZOLE 500 MG PO TABS: 500.0000 mg | ORAL_TABLET | Freq: Two times a day (BID) | ORAL | Status: DC

## 2015-06-14 NOTE — Care Management Note (Signed)
Case Management Note  Patient Details  Name: Redgie GrayerRachael M Hotard MRN: [id number] Date of Birth: 11-11-1980  Subjective/Objective:                 Patient in obs for vomiting. Lives at home with family. Will DC to home today. No HH or CM needs.   Action/Plan:   Expected Discharge Date:                  Expected Discharge Plan:  Home/Self Care  In-House Referral:     Discharge planning Services  CM Consult  Post Acute Care Choice:  NA Choice offered to:     DME Arranged:    DME Agency:     HH Arranged:    HH Agency:     Status of Service:  Completed, signed off  Medicare Important Message Given:    Date Medicare IM Given:    Medicare IM give by:    Date Additional Medicare IM Given:    Additional Medicare Important Message give by:     If discussed at Long Length of Stay Meetings, dates discussed:    Additional Comments:  Lawerance SabalDebbie Alexia Dinger, RN 06/14/2015, 1:13 PM

## 2015-06-14 NOTE — Progress Notes (Signed)
Pt given discharge instructions, prescriptions, and care notes. Pt verbalized understanding AEB no further questions or concerns at this time. IV was discontinued, no redness, pain, or swelling noted at this time. Pt left the floor via ambulation with staff in stable condition. 

## 2015-06-14 NOTE — Discharge Summary (Signed)
Physician Discharge Summary  Redgie GrayerRachael M Walsh ZOX:[id number] DOB: 02-Aug-1980 DOA: 06/12/2015  PCP: Maye HidesMILLER, RYAN DEAN, PA  Admit date: 06/12/2015 Discharge date: 06/14/2015  Time spent: 35 minutes  Recommendations for Outpatient Follow-up:  1. Needs GI referral non emergently 2. complete flagyll for BV  Discharge Diagnoses:  Principal Problem:   Intractable vomiting Active Problems:   Colicky abdominal pain   Leukocytosis   Recurrent infections   Dehydration   Asthma   GERD (gastroesophageal reflux disease)   Hepatic steatosis with new subcentimeter anterior liver lesion   Lung nodule, solitary   Intractable nausea and vomiting   Bacterial vaginosis   Discharge Condition: good  Diet recommendation: soft diet x 3-4 days  Filed Weights   06/12/15 1033 06/12/15 1653  Weight: 127.007 kg (280 lb) 130.455 kg (287 lb 9.6 oz)    History of present illness:  35 year old female History chronic cholecystitis status post laparoscopic cholecystectomy 2014 Morbid obesity, Body mass index is 49.34 kg/(m^2).--currently on a diet and has lost a lot of weight ? Bacterial vaginosis on admission  Admitted 06/12/2015 with episodic recurrent and persistent acute onset nausea vomiting after having a protein shake and broccoli at home No sick contacts otherwise No sick kids at home  CBC 19 Specific gravity elevated States that she had some reddish fluid in her vomitus  Hospital Course:  Etiology unclear however seems very more likely than not viral moreso than organic pathology in her abdomen She has a history of an SBO in the past however her CT scan is completely negative Discharge diagnosis subacute gastritis 2/2 to NSAID use Her LFTs are normal and I do not think there is any CBD pathology nor any other intra-abdominal pathology tolerated 3 meals without event and stabilized for d/c home  She will need workup of her solitary lung nodule as an outpatient  ? Bacterial  vaginosis -Transition IV Flagyl to by mouth Flagyl 500 bid to complete course of therapy   Discharge Exam: Filed Vitals:   06/13/15 2138 06/14/15 0508  BP: 111/53 91/60  Pulse: 65 59  Temp: 98.4 F (36.9 C) 98.5 F (36.9 C)  Resp: 17 18    General: eomi ncat Cardiovascular: s1 s2 no m/r/g Respiratory: clear no added sound  Discharge Instructions   Discharge Instructions    Diet - low sodium heart healthy    Complete by:  As directed      Discharge instructions    Complete by:  As directed   Finish the flagyll completely please Please STOP COMPLETELY nonsteroidal meds like Ibuprofen--Tylenol is much safer for your body and stomach and if you really need to use a non-steroidal, would use Naprocen >Ibuprofen Please have a soft diet for the time being I am not completely sure what caused your pain-We think it was either a mild viral illness causing significant reflux/gastritis or a mild erosion of your esophagus from Ibuprofen use I recommend Maalox for 3-4 days and then stop-this will coat your stomach in case there was an erosion If you have further symptoms I would recommend asking Dr. Hyacinth MeekerMiller for a referral to a Gastroenterologist     Increase activity slowly    Complete by:  As directed           Current Discharge Medication List    START taking these medications   Details  Calcium Carbonate Antacid (MAALOX) 600 MG chewable tablet Chew 1 tablet (600 mg total) by mouth 2 (two) times daily. Qty: 60 tablet, Refills: 0  metroNIDAZOLE (FLAGYL) 500 MG tablet Take 1 tablet (500 mg total) by mouth every 12 (twelve) hours. Qty: 10 tablet, Refills: 0      CONTINUE these medications which have NOT CHANGED   Details  albuterol (PROVENTIL HFA;VENTOLIN HFA) 108 (90 BASE) MCG/ACT inhaler Inhale 2 puffs into the lungs every 6 (six) hours as needed. For shortness of breath.    Chlorphen-Pseudoephed-APAP (THERAFLU FLU/COLD PO) Take 4 oz by mouth as needed. Cold symptoms     DEXILANT 30 MG capsule Take 1 capsule by mouth daily. Refills: 0    fluticasone (FLONASE) 50 MCG/ACT nasal spray Place 1 spray into both nostrils daily.    guaiFENesin-codeine 100-10 MG/5ML syrup Take 10 mLs by mouth every 4 (four) hours as needed.    Multiple Vitamins-Minerals (MULTIVITAMIN WOMEN PO) Take 1 tablet by mouth daily.      STOP taking these medications     ibuprofen (ADVIL,MOTRIN) 200 MG tablet        Allergies  Allergen Reactions  . Depo-Provera [Medroxyprogesterone]     Weight gain  . Nuvaring [Etonogestrel-Ethinyl Estradiol]     Rash on face,mood swings  . Ortho-Cyclen [Norgestimate-Eth Estradiol]   . Penicillins Rash      The results of significant diagnostics from this hospitalization (including imaging, microbiology, ancillary and laboratory) are listed below for reference.    Significant Diagnostic Studies: Ct Abdomen Pelvis W Contrast  06/12/2015  CLINICAL DATA:  Epigastric abdominal pain and nausea for 1 day. History of cholecystectomy and prior small bowel obstruction. EXAM: CT ABDOMEN AND PELVIS WITH CONTRAST TECHNIQUE: Multidetector CT imaging of the abdomen and pelvis was performed using the standard protocol following bolus administration of intravenous contrast. CONTRAST:  ISOVUE-300 IOPAMIDOL (ISOVUE-300) INJECTION 61% COMPARISON:  11/06/2010 CT abdomen/pelvis. FINDINGS: Lower chest: Superior right middle lobe 3 mm pulmonary nodule associated with the minor fissure (series 4/image 3), stable since 12/17/2011 chest CT angiogram and benign. Hepatobiliary: Diffuse hepatic steatosis. Hypodense 0.4 cm liver lesion in the anterior liver, not definitely seen on prior unenhanced chest CT, too small to characterize. Otherwise normal liver. Cholecystectomy. No biliary ductal dilatation. Pancreas: Normal, with no mass or duct dilation. Spleen: Normal size. No mass. Adrenals/Urinary Tract: Normal adrenals. No hydronephrosis. No renal masses. Persistent fetal  lobulations in both kidneys, unchanged. Normal bladder. Stomach/Bowel: Grossly normal stomach. Normal caliber small bowel with no small bowel wall thickening. Normal appendix. Normal large bowel with no diverticulosis, large bowel wall thickening or pericolonic fat stranding. Vascular/Lymphatic: Normal caliber abdominal aorta. Patent portal, splenic, hepatic and renal veins. No pathologically enlarged lymph nodes in the abdomen or pelvis. Reproductive: Grossly normal anteverted uterus. Intrauterine device appears grossly normal in position in the fundal endometrial cavity. No adnexal mass. Other: No pneumoperitoneum, ascites or focal fluid collection. Musculoskeletal: No aggressive appearing focal osseous lesions. Minimal degenerative changes in the visualized thoracolumbar spine. IMPRESSION: 1. No acute abnormality. No evidence of bowel obstruction or acute bowel inflammation. Normal appendix . 2. Diffuse hepatic steatosis. Electronically Signed   By: Delbert Phenix M.D.   On: 06/12/2015 14:07    Microbiology: Recent Results (from the past 240 hour(s))  Wet prep, genital     Status: Abnormal   Collection Time: 06/12/15  3:51 PM  Result Value Ref Range Status   Yeast Wet Prep HPF POC NONE SEEN NONE SEEN Final   Trich, Wet Prep NONE SEEN NONE SEEN Final   Clue Cells Wet Prep HPF POC PRESENT (A) NONE SEEN Final   WBC, Wet Prep  HPF POC FEW (A) NONE SEEN Final   Sperm NONE SEEN  Final  Culture, blood (Routine X 2) w Reflex to ID Panel     Status: None (Preliminary result)   Collection Time: 06/12/15  5:15 PM  Result Value Ref Range Status   Specimen Description BLOOD RIGHT ARM  Final   Special Requests IN PEDIATRIC BOTTLE 3CC  Final   Culture NO GROWTH < 24 HOURS  Final   Report Status PENDING  Incomplete  Culture, blood (Routine X 2) w Reflex to ID Panel     Status: None (Preliminary result)   Collection Time: 06/12/15  6:21 PM  Result Value Ref Range Status   Specimen Description BLOOD LEFT HAND   Final   Special Requests IN PEDIATRIC BOTTLE  Final   Culture NO GROWTH < 24 HOURS  Final   Report Status PENDING  Incomplete  Urine culture     Status: Abnormal   Collection Time: 06/13/15  2:13 AM  Result Value Ref Range Status   Specimen Description URINE, RANDOM  Final   Special Requests NONE  Final   Culture MULTIPLE SPECIES PRESENT, SUGGEST RECOLLECTION (A)  Final   Report Status 06/14/2015 FINAL  Final     Labs: Basic Metabolic Panel:  Recent Labs Lab 06/12/15 1051 06/13/15 0541  NA 136 138  K 3.8 3.7  CL 104 109  CO2 22 22  GLUCOSE 109* 77  BUN 17 14  CREATININE 0.73 0.54  CALCIUM 10.1 8.3*   Liver Function Tests:  Recent Labs Lab 06/12/15 1051  AST 39  ALT 49  ALKPHOS 88  BILITOT 0.7  PROT 8.3*  ALBUMIN 4.4    Recent Labs Lab 06/12/15 1051  LIPASE 16   No results for input(s): AMMONIA in the last 168 hours. CBC:  Recent Labs Lab 06/12/15 1051 06/12/15 1713 06/13/15 0541  WBC 19.2*  --  7.4  NEUTROABS  --  13.7*  --   HGB 15.0  --  11.2*  HCT 44.0  --  35.4*  MCV 84.0  --  85.9  PLT 312  --  196   Cardiac Enzymes: No results for input(s): CKTOTAL, CKMB, CKMBINDEX, TROPONINI in the last 168 hours. BNP: BNP (last 3 results) No results for input(s): BNP in the last 8760 hours.  ProBNP (last 3 results) No results for input(s): PROBNP in the last 8760 hours.  CBG: No results for input(s): GLUCAP in the last 168 hours.     SignedRhetta Mura MD   [facility] 06/14/2015, 12:37 PM

## 2015-06-17 LAB — CULTURE, BLOOD (ROUTINE X 2)
Culture: NO GROWTH
Culture: NO GROWTH

## 2016-11-03 ENCOUNTER — Emergency Department (HOSPITAL_BASED_OUTPATIENT_CLINIC_OR_DEPARTMENT_OTHER): Payer: BLUE CROSS/BLUE SHIELD

## 2016-11-03 ENCOUNTER — Emergency Department (HOSPITAL_BASED_OUTPATIENT_CLINIC_OR_DEPARTMENT_OTHER)
Admission: EM | Admit: 2016-11-03 | Discharge: 2016-11-03 | Disposition: A | Discharge status: Home / Self Care | Payer: BLUE CROSS/BLUE SHIELD | Attending: Emergency Medicine | Admitting: Emergency Medicine

## 2016-11-03 ENCOUNTER — Encounter (HOSPITAL_BASED_OUTPATIENT_CLINIC_OR_DEPARTMENT_OTHER): Payer: Self-pay

## 2016-11-03 DIAGNOSIS — S0990XA Unspecified injury of head, initial encounter: Secondary | ICD-10-CM | POA: Diagnosis present

## 2016-11-03 DIAGNOSIS — J45909 Unspecified asthma, uncomplicated: Secondary | ICD-10-CM | POA: Diagnosis not present

## 2016-11-03 DIAGNOSIS — Y929 Unspecified place or not applicable: Secondary | ICD-10-CM | POA: Insufficient documentation

## 2016-11-03 DIAGNOSIS — Z79899 Other long term (current) drug therapy: Secondary | ICD-10-CM | POA: Diagnosis not present

## 2016-11-03 DIAGNOSIS — Y999 Unspecified external cause status: Secondary | ICD-10-CM | POA: Insufficient documentation

## 2016-11-03 DIAGNOSIS — Y939 Activity, unspecified: Secondary | ICD-10-CM | POA: Insufficient documentation
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
## 2016-11-03 NOTE — Discharge Instructions (Signed)
Please read attached inf regarding your condition. Take Tylenol or ibuprofen as needed for headache. Follow up with your PCP for further evaluation. Return to ED for additional injury, numbness in legs, vision changes, abdominal pain, lightheadedness or loss of consciousness.

## 2017-01-20 HISTORY — PX: COLONOSCOPY: SHX174

## 2018-08-11 ENCOUNTER — Other Ambulatory Visit: Payer: Self-pay

## 2018-08-11 DIAGNOSIS — Z20822 Contact with and (suspected) exposure to covid-19: Secondary | ICD-10-CM

## 2018-08-15 LAB — NOVEL CORONAVIRUS, NAA: SARS-CoV-2, NAA: NOT DETECTED

## 2019-10-10 NOTE — Progress Notes (Deleted)
New Patient Note  RE: Claudia Walsh MRN: [id number] DOB: 01-18-1981 Date of Office Visit: 10/11/2019  Referring provider: Maye Hides, PA Primary care provider: Maye Hides, PA  Chief Complaint: No chief complaint on file.  History of Present Illness: I had the pleasure of seeing Claudia Walsh for initial evaluation at the [facility] on 10/10/2019. She is a 39 y.o. female, who is referred here by Maye Hides, PA for the evaluation of asthma.  She reports symptoms of *** chest tightness, shortness of breath, coughing, wheezing, nocturnal awakenings for *** years. Current medications include *** which help. She reports *** using aerochamber with inhalers. She tried the following inhalers: ***. Main triggers are ***allergies, infections, weather changes, smoke, exercise, pet exposure. In the last month, frequency of symptoms: ***x/week. Frequency of nocturnal symptoms: ***x/month. Frequency of SABA use: ***x/week. Interference with physical activity: ***. Sleep is ***disturbed. In the last 12 months, emergency room visits/urgent care visits/doctor office visits or hospitalizations due to respiratory issues: ***. In the last 12 months, oral steroids courses: ***. Lifetime history of hospitalization for respiratory issues: ***. Prior intubations: ***. Asthma was diagnosed at age *** by ***. History of pneumonia: ***. She was evaluated by allergist ***pulmonologist in the past. Smoking exposure: ***. Up to date with flu vaccine: ***. Up to date with pneumonia vaccine: ***. Up to date with COVID-19 vaccine: ***.  History of reflux: ***.  Assessment and Plan: Claudia Walsh is a 39 y.o. female with: No problem-specific Assessment & Plan notes found for this encounter.  No follow-ups on file.  No orders of the defined types were placed in this encounter.  Lab Orders  No laboratory test(s) ordered today    Other allergy screening: Asthma: {Blank  single:19197::"yes","no"} Rhino conjunctivitis: {Blank single:19197::"yes","no"} Food allergy: {Blank single:19197::"yes","no"} Medication allergy: {Blank single:19197::"yes","no"} Hymenoptera allergy: {Blank single:19197::"yes","no"} Urticaria: {Blank single:19197::"yes","no"} Eczema:{Blank single:19197::"yes","no"} History of recurrent infections suggestive of immunodeficency: {Blank single:19197::"yes","no"}  Diagnostics: Spirometry:  Tracings reviewed. Her effort: {Blank single:19197::"Good reproducible efforts.","It was hard to get consistent efforts and there is a question as to whether this reflects a maximal maneuver.","Poor effort, data can not be interpreted."} FVC: ***L FEV1: ***L, ***% predicted FEV1/FVC ratio: ***% Interpretation: {Blank single:19197::"Spirometry consistent with mild obstructive disease","Spirometry consistent with moderate obstructive disease","Spirometry consistent with severe obstructive disease","Spirometry consistent with possible restrictive disease","Spirometry consistent with mixed obstructive and restrictive disease","Spirometry uninterpretable due to technique","Spirometry consistent with normal pattern","No overt abnormalities noted given today's efforts"}.  Please see scanned spirometry results for details.  Skin Testing: {Blank single:19197::"Select foods","Environmental allergy panel","Environmental allergy panel and select foods","Food allergy panel","None","Deferred due to recent antihistamines use"}. Positive test to: ***. Negative test to: ***.  Results discussed with patient/family.   Past Medical History: Patient Active Problem List   Diagnosis Date Noted  . Intractable vomiting 06/12/2015  . Colicky abdominal pain 06/12/2015  . Leukocytosis 06/12/2015  . Recurrent infections 06/12/2015  . Dehydration 06/12/2015  . Asthma 06/12/2015  . GERD (gastroesophageal reflux disease) 06/12/2015  . Hepatic steatosis with new subcentimeter  anterior liver lesion 06/12/2015  . Lung nodule, solitary 06/12/2015  . Intractable nausea and vomiting 06/12/2015  . Bacterial vaginosis 06/12/2015  . Chronic cholecystitis 12/05/2011   Past Medical History:  Diagnosis Date  . ADD (attention deficit disorder)   . Anemia "in my early 20's"  . Anxiety   . Asthma   . Chronic bronchitis (HCC)   . Cluster headache    "yearly" (06/12/2015)  . Cough with  sputum 12/08/11   productive green phlegm in AM, clear nasal drainage with sinus congestion  . Depression    states off all meds  . GERD (gastroesophageal reflux disease)   . H/O hiatal hernia   . Kidney stone    "passed them" (06/12/2015)  . Morbid obesity (HCC)   . Sleep apnea    "dx'd; don't use a mask" (06/12/2015)  . Small bowel obstruction [facility])    Past Surgical History: Past Surgical History:  Procedure Laterality Date  . CESAREAN SECTION  2009  . CHOLECYSTECTOMY  12/12/2011   Procedure: LAPAROSCOPIC CHOLECYSTECTOMY WITH INTRAOPERATIVE CHOLANGIOGRAM;  Surgeon: Wilmon Arms. Corliss Skains, MD;  Location: [facility];  Service: General;  Laterality: N/A;  . TYMPANOSTOMY TUBE PLACEMENT  ~ 1989  . UPPER GASTROINTESTINAL ENDOSCOPY    . WISDOM TOOTH EXTRACTION  2001   Medication List:  Current Outpatient Medications  Medication Sig Dispense Refill  . albuterol (PROVENTIL HFA;VENTOLIN HFA) 108 (90 BASE) MCG/ACT inhaler Inhale 2 puffs into the lungs every 6 (six) hours as needed. For shortness of breath.    . Cetirizine HCl (ZYRTEC PO) Take by mouth.    . DEXILANT 30 MG capsule Take 1 capsule by mouth daily.  0  . Multiple Vitamins-Minerals (MULTIVITAMIN WOMEN PO) Take 1 tablet by mouth daily.     No current facility-administered medications for this visit.   Allergies: Allergies  Allergen Reactions  . Depo-Provera [Medroxyprogesterone]     Weight gain  . Nuvaring [Etonogestrel-Ethinyl Estradiol]     Rash on face,mood swings  . Ortho-Cyclen [Norgestimate-Eth Estradiol]   . Penicillins  Rash   Social History: Social History   Socioeconomic History  . Marital status: Divorced    Spouse name: Not on file  . Number of children: Not on file  . Years of education: Not on file  . Highest education level: Not on file  Occupational History  . Not on file  Tobacco Use  . Smoking status: Never Smoker  . Smokeless tobacco: Never Used  Substance and Sexual Activity  . Alcohol use: Yes    Comment: occ  . Drug use: No  . Sexual activity: Not on file  Other Topics Concern  . Not on file  Social History Narrative  . Not on file   Social Determinants of Health   Financial Resource Strain:   . Difficulty of Paying Living Expenses: Not on file  Food Insecurity:   . Worried About Programme researcher, broadcasting/film/video in the Last Year: Not on file  . Ran Out of Food in the Last Year: Not on file  Transportation Needs:   . Lack of Transportation (Medical): Not on file  . Lack of Transportation (Non-Medical): Not on file  Physical Activity:   . Days of Exercise per Week: Not on file  . Minutes of Exercise per Session: Not on file  Stress:   . Feeling of Stress : Not on file  Social Connections:   . Frequency of Communication with Friends and Family: Not on file  . Frequency of Social Gatherings with Friends and Family: Not on file  . Attends Religious Services: Not on file  . Active Member of Clubs or Organizations: Not on file  . Attends Banker Meetings: Not on file  . Marital Status: Not on file   Lives in a ***. Smoking: *** Occupation: ***  Environmental History: Water Damage/mildew in the house: Copywriter, advertising in the family room: {Blank single:19197::"yes","no"} Carpet in the bedroom: {Blank single:19197::"yes","no"}  Heating: {Blank single:19197::"electric","gas"} Cooling: {Blank single:19197::"central","window"} Pet: {Blank single:19197::"yes ***","no"}  Family History: No family history on file. Problem                                Relation Asthma                                   *** Eczema                                *** Food allergy                          *** Allergic rhino conjunctivitis     ***  Review of Systems  Constitutional: Negative for appetite change, chills, fever and unexpected weight change.  HENT: Negative for congestion and rhinorrhea.   Eyes: Negative for itching.  Respiratory: Negative for cough, chest tightness, shortness of breath and wheezing.   Cardiovascular: Negative for chest pain.  Gastrointestinal: Negative for abdominal pain.  Genitourinary: Negative for difficulty urinating.  Skin: Negative for rash.  Neurological: Negative for headaches.   Objective: There were no vitals taken for this visit. There is no height or weight on file to calculate BMI. Physical Exam Vitals and nursing note reviewed.  Constitutional:      Appearance: Normal appearance. She is well-developed.  HENT:     Head: Normocephalic and atraumatic.     Right Ear: External ear normal.     Left Ear: External ear normal.     Nose: Nose normal.     Mouth/Throat:     Mouth: Mucous membranes are moist.     Pharynx: Oropharynx is clear.  Eyes:     Conjunctiva/sclera: Conjunctivae normal.  Cardiovascular:     Rate and Rhythm: Normal rate and regular rhythm.     Heart sounds: Normal heart sounds. No murmur heard.  No friction rub. No gallop.   Pulmonary:     Effort: Pulmonary effort is normal.     Breath sounds: Normal breath sounds. No wheezing, rhonchi or rales.  Abdominal:     Palpations: Abdomen is soft.  Musculoskeletal:     Cervical back: Neck supple.  Skin:    General: Skin is warm.     Findings: No rash.  Neurological:     Mental Status: She is alert and oriented to person, place, and time.  Psychiatric:        Behavior: Behavior normal.    The plan was reviewed with the patient/family, and all questions/concerned were addressed.  It was my pleasure to see Claudia Walsh today and participate  in her care. Please feel free to contact me with any questions or concerns.  Sincerely,  Wyline Mood, DO Allergy & Immunology  [facility] office: [phone] [facility] office: [phone] [place] office: [phone]

## 2019-10-11 ENCOUNTER — Ambulatory Visit: Payer: Self-pay | Admitting: Allergy

## 2019-10-26 NOTE — Progress Notes (Signed)
New Patient Note  RE: Claudia Walsh MRN: [id number] DOB: [birth year] Date of Office Visit: 10/27/2019  Referring provider: No ref. provider found Primary care provider: Dagoberto Reef, PA-C  Chief Complaint: Asthma and Allergies (itching)  History of Present Illness: I had the pleasure of seeing Claudia Walsh for initial evaluation at the [facility] on 10/27/2019. She is a 39 y.o. female, who is self-referred here for the evaluation of asthma and allergies.   Breathing:  She reports symptoms of chest tightness, shortness of breath, coughing, wheezing at night, nocturnal awakenings for 25+ years. Current medications include Flovent unknown dose 2-4 puffs once a day x 3 weeks and albuterol prn which help. She reports using aerochamber with inhalers. She tried the following inhalers: none. Main triggers are allergies (worse in the spring and fall), exertion. In the last month, frequency of symptoms: few times/week. Frequency of nocturnal symptoms: 0x/month. Frequency of SABA use: few times/week. Interference with physical activity: yes. Sleep is disturbed. In the last 12 months, emergency room visits/urgent care or hospitalizations due to respiratory issues: no. In the last 12 months, oral steroids courses: twice. Lifetime history of hospitalization for respiratory issues: no. Prior intubations: no. History of pneumonia: no. She was evaluated by allergist in the past. Smoking exposure: denies. Up to date with flu vaccine: yes. Up to date with COVID-19 vaccine: yes.  History of reflux: takes Dexilant and omeprazole.  Itching/rash Itching/rash started about 3 years ago. This can occur anywhere on her body. Describes them as itchy, red, raised. Individual rashes lasts about few hours. No ecchymosis upon resolution. Associated symptoms include: none. Suspected triggers are unknown but gets worse after itching the area. Denies any  fevers, chills, changes in  medications, foods, personal care products or recent infections. She has tried the following therapies: zyrtec  every 2 days with some benefit. Systemic steroids no.  Previous work up includes: allergy testing a few years ago was positive to dust mites, trees, mold per patient report. Previous history of rash/hives: no. Patient is up to date with the following cancer screening tests: physical exam, pap smears.  Assessment and Plan: Claudia Walsh is a 39 y.o. female with: Reactive airway disease, moderate persistent, uncomplicated Patient apparently was diagnosed with asthma in the past. Complaining now of wheezing at night with chest tightness, shortness of breath and coughing. Re-started her Flovent unknown dose for the past 3 weeks which helped with the wheezing. Takes PPI for reflux.   Today's spirometry was normal with no improvement in FEV1 post bronchodilator treatment. Clinically feeling improved.  Interestingly patient started to feel chest tightness and was doing some quick and bigger breathing during the visit to get "air down" her lungs. No wheezing noted on exam.   Discussed with patient that there is some concern for VCD given the above scenario - recommend ENT referral next.  o Patient mentions being diagnosed with hyperventilation syndrome in the past as well.   Today's breathing test was normal with no improvement after the treatment.   Let us know what Flovent dose you are taking.   Daily controller medication(s): continue with Flovent 2 puffs twice a day with spacer and rinse mouth afterwards.  May use albuterol rescue inhaler 2 puffs every 4 to 6 hours as needed for shortness of breath, chest tightness, coughing, and wheezing. May use albuterol rescue inhaler 2 puffs 5 to 15 minutes prior to strenuous physical activities. Monitor frequency of use.   Repeat spirometry at next  visit.   Other atopic dermatitis Some eczema patches on the leg.  Start proper skin care as  below.  May use triamcinolone 0.1% ointment twice a day as needed for eczema flares. Do not use on the face, neck, armpits or groin area. Do not use more than 3 weeks in a row.   Rash and other nonspecific skin eruption Describes hive like rashes for the past 3 years. No triggers noted but worse after itching a particular area. Resolves within a few hours. Zyrtec seems to help.  No prior work up for this.  Keep track of itching/hives.  Take pictures when it flares up.  Try to take zyrtec 10mg  twice a day instead of zyrtec 20mg  every 2 days.  Avoid the following potential triggers: alcohol, tight clothing, NSAIDs.  Get bloodwork to rule out other etiologies.  Other allergic rhinitis Allergy testing a few years ago was positive to dust mites, trees, mold per patient report.  Declines repeat testing today.  The zyrtec as above should also help with these symptoms.  If noticing worsening symptoms recommend re-testing.  See below for environmental control measures.   Heartburn Significant reflux and takes Dexilant and omeprazole.  See below for heartburn lifestyle modifications.  Continue with Dexilant and omeprazole as prescribed.  Return in about 3 months (around 01/27/2020).  Meds ordered this encounter  Medications   triamcinolone ointment (KENALOG) 0.1 %    Sig: Apply 1 application topically 2 (two) times daily as needed. Do not use on the face, neck, armpits or groin area. Do not use more than 3 weeks in a row.    Dispense:  30 g    Refill:  2    Lab Orders     Alpha-Gal Panel     CBC with Differential/Platelet     Chronic Urticaria     Comprehensive metabolic panel     Tryptase     Thyroid Cascade Profile     ANA w/Reflex  Other allergy screening: Rhino conjunctivitis: no Food allergy: no Medication allergy: yes Hymenoptera allergy: no Eczema: yes on the ankles and does not use anything for this.  History of recurrent infections suggestive of  immunodeficency: no  Diagnostics: Spirometry:  Tracings reviewed. Her effort: Good reproducible efforts. FVC: 3.76L FEV1: 3.30L, 104% predicted FEV1/FVC ratio: 88% Interpretation: Spirometry consistent with normal pattern with no improvement in FEV1 post bronchodilator treatment. Clinically feeling improved. Please see scanned spirometry results for details.  Past Medical History: Patient Active Problem List   Diagnosis Date Noted   Other atopic dermatitis 10/27/2019   Rash and other nonspecific skin eruption 10/27/2019   Other allergic rhinitis 10/27/2019   Heartburn 10/27/2019   Reactive airway disease, moderate persistent, uncomplicated 10/27/2019   Intractable vomiting 06/12/2015   Colicky abdominal pain 06/12/2015   Leukocytosis 06/12/2015   Recurrent infections 06/12/2015   Dehydration 06/12/2015   Asthma 06/12/2015   GERD (gastroesophageal reflux disease) 06/12/2015   Hepatic steatosis with new subcentimeter anterior liver lesion 06/12/2015   Lung nodule, solitary 06/12/2015   Intractable nausea and vomiting 06/12/2015   Bacterial vaginosis 06/12/2015   Chronic cholecystitis 12/05/2011   Past Medical History:  Diagnosis Date   ADD (attention deficit disorder)    Anemia "in my early 20's"   Anxiety    Asthma    Chronic bronchitis (HCC)    Cluster headache    "yearly" (06/12/2015)   Cough with sputum 12/08/11   productive green phlegm in AM, clear nasal drainage with sinus congestion  Depression    states off all meds   GERD (gastroesophageal reflux disease)    H/O hiatal hernia    Kidney stone    "passed them" (06/12/2015)   Morbid obesity (HCC)    Sleep apnea    "dx'd; don't use a mask" (06/12/2015)   Small bowel obstruction [facility](HCC)    Past Surgical History: Past Surgical History:  Procedure Laterality Date   CESAREAN SECTION  2009   CHOLECYSTECTOMY  12/12/2011   Procedure: LAPAROSCOPIC CHOLECYSTECTOMY WITH INTRAOPERATIVE  CHOLANGIOGRAM;  Surgeon: Wilmon ArmsMatthew K. Tsuei, MD;  Location: [facility];  Service: General;  Laterality: N/A;   TYMPANOSTOMY TUBE PLACEMENT  ~ 1989   UPPER GASTROINTESTINAL ENDOSCOPY     WISDOM TOOTH EXTRACTION  2001   Medication List:  Current Outpatient Medications  Medication Sig Dispense Refill   acetaminophen (TYLENOL) 500 MG tablet 1 tablet as needed     albuterol (PROVENTIL HFA;VENTOLIN HFA) 108 (90 BASE) MCG/ACT inhaler Inhale 2 puffs into the lungs every 6 (six) hours as needed. For shortness of breath.     Cetirizine HCl (ZYRTEC PO) Take by mouth.     DEXILANT 30 MG capsule Take 1 capsule by mouth daily.  0   fluticasone (FLOVENT HFA) 44 MCG/ACT inhaler 1 puff     gabapentin (NEURONTIN) 300 MG capsule Take 300-600 mg by mouth at bedtime as needed.     Methocarbamol (ROBAXIN PO) Take by mouth.     omeprazole (PRILOSEC) 10 MG capsule 1 capsule 30 minutes before morning meal     TRINTELLIX 10 MG TABS tablet Take by mouth.     Multiple Vitamins-Minerals (MULTIVITAMIN WOMEN PO) Take 1 tablet by mouth daily. (Patient not taking: Reported on 10/27/2019)     triamcinolone ointment (KENALOG) 0.1 % Apply 1 application topically 2 (two) times daily as needed. Do not use on the face, neck, armpits or groin area. Do not use more than 3 weeks in a row. 30 g 2   No current facility-administered medications for this visit.   Allergies: Allergies  Allergen Reactions   Depo-Provera [Medroxyprogesterone]     Weight gain   Nuvaring [Etonogestrel-Ethinyl Estradiol]     Rash on face,mood swings   Ortho-Cyclen [Norgestimate-Eth Estradiol]    Penicillins Rash   Social History: Social History   Socioeconomic History   Marital status: Divorced    Spouse name: Not on file   Number of children: Not on file   Years of education: Not on file   Highest education level: Not on file  Occupational History   Not on file  Tobacco Use   Smoking status: Never Smoker   Smokeless  tobacco: Never Used  Substance and Sexual Activity   Alcohol use: Yes    Comment: occ   Drug use: No   Sexual activity: Not on file  Other Topics Concern   Not on file  Social History Narrative   Not on file   Social Determinants of Health   Financial Resource Strain:    Difficulty of Paying Living Expenses: Not on file  Food Insecurity:    Worried About Running Out of Food in the Last Year: Not on file   Ran Out of Food in the Last Year: Not on file  Transportation Needs:    Lack of Transportation (Medical): Not on file   Lack of Transportation (Non-Medical): Not on file  Physical Activity:    Days of Exercise per Week: Not on file   Minutes of Exercise per Session: Not  on file  Stress:    Feeling of Stress : Not on file  Social Connections:    Frequency of Communication with Friends and Family: Not on file   Frequency of Social Gatherings with Friends and Family: Not on file   Attends Religious Services: Not on file   Active Member of Clubs or Organizations: Not on file   Attends Banker Meetings: Not on file   Marital Status: Not on file   Lives in a 37 year old home. Smoking: denies Occupation: research and adjustments rep  Environmental History: Water Damage/mildew in the house: no Carpet in the family room: no Carpet in the bedroom: no Heating: electric Cooling: central Pet: yes 2 cats x 2 yrs  Family History: History reviewed. No pertinent family history. Problem                               Relation Asthma                                   Sisters  Eczema                                No  Food allergy                          No  Allergic rhino conjunctivitis     No   Review of Systems  Constitutional: Negative for appetite change, chills, fever and unexpected weight change.  HENT: Negative for congestion and rhinorrhea.   Eyes: Negative for itching.  Respiratory: Positive for cough, chest tightness, shortness of  breath and wheezing.   Cardiovascular: Negative for chest pain.  Gastrointestinal: Negative for abdominal pain.  Genitourinary: Negative for difficulty urinating.  Skin: Positive for rash.  Neurological: Negative for headaches.   Objective: BP 122/72    Pulse 92    Temp (!) 97.2 F (36.2 C) (Temporal)    Resp 16    Ht 5' 4.5" (1.638 m)    Wt (!) 364 lb 6.4 oz (165.3 kg)    SpO2 97%    BMI 61.58 kg/m  Body mass index is 61.58 kg/m. Physical Exam Vitals and nursing note reviewed.  Constitutional:      Appearance: Normal appearance. She is well-developed. She is obese.  HENT:     Head: Normocephalic and atraumatic.     Right Ear: Tympanic membrane and external ear normal.     Left Ear: Tympanic membrane and external ear normal.     Nose: Nose normal.     Mouth/Throat:     Mouth: Mucous membranes are moist.     Pharynx: Oropharynx is clear.  Eyes:     Conjunctiva/sclera: Conjunctivae normal.  Cardiovascular:     Rate and Rhythm: Normal rate and regular rhythm.     Heart sounds: Normal heart sounds. No murmur heard.  No friction rub. No gallop.   Pulmonary:     Effort: Pulmonary effort is normal.     Breath sounds: Normal breath sounds. No wheezing, rhonchi or rales.  Musculoskeletal:     Cervical back: Neck supple.  Skin:    General: Skin is warm.     Findings: Rash present.     Comments: Dry patch on top of left foot  with excoriations.   Neurological:     Mental Status: She is alert and oriented to person, place, and time.  Psychiatric:        Behavior: Behavior normal.    The plan was reviewed with the patient/family, and all questions/concerned were addressed.  It was my pleasure to see Claudia Walsh today and participate in her care. Please feel free to contact me with any questions or concerns.  Sincerely,  Wyline Mood, DO Allergy & Immunology  [facility] office: [phone] [facility] office: [phone]

## 2019-10-27 ENCOUNTER — Ambulatory Visit (INDEPENDENT_AMBULATORY_CARE_PROVIDER_SITE_OTHER): Payer: BC Managed Care – PPO | Admitting: Allergy

## 2019-10-27 ENCOUNTER — Other Ambulatory Visit: Payer: Self-pay

## 2019-10-27 ENCOUNTER — Encounter: Payer: Self-pay | Admitting: Allergy

## 2019-10-27 VITALS — BP 122/72 | HR 92 | Temp 97.2°F | Resp 16 | Ht 64.5 in | Wt 364.4 lb

## 2019-10-27 DIAGNOSIS — L2089 Other atopic dermatitis: Secondary | ICD-10-CM

## 2019-10-27 DIAGNOSIS — J4540 Moderate persistent asthma, uncomplicated: Secondary | ICD-10-CM

## 2019-10-27 DIAGNOSIS — R062 Wheezing: Secondary | ICD-10-CM

## 2019-10-27 DIAGNOSIS — R21 Rash and other nonspecific skin eruption: Secondary | ICD-10-CM | POA: Diagnosis not present

## 2019-10-27 DIAGNOSIS — J3089 Other allergic rhinitis: Secondary | ICD-10-CM | POA: Insufficient documentation

## 2019-10-27 DIAGNOSIS — R12 Heartburn: Secondary | ICD-10-CM

## 2019-10-27 MED ORDER — TRIAMCINOLONE ACETONIDE 0.1 % EX OINT: 1.0000 "application " | TOPICAL_OINTMENT | Freq: Two times a day (BID) | CUTANEOUS | 2 refills | Status: DC | PRN

## 2019-10-27 NOTE — Assessment & Plan Note (Signed)
Patient apparently was diagnosed with asthma in the past. Complaining now of wheezing at night with chest tightness, shortness of breath and coughing. Re-started her Flovent unknown dose for the past 3 weeks which helped with the wheezing. Takes PPI for reflux.  . Today's spirometry was normal with no improvement in FEV1 post bronchodilator treatment. Clinically feeling improved. . Interestingly patient started to feel chest tightness and was doing some quick and bigger breathing during the visit to get "air down" her lungs. No wheezing noted on exam.  . Discussed with patient that there is some concern for VCD given the above scenario - recommend ENT referral next.  o Patient mentions being diagnosed with hyperventilation syndrome in the past as well.  . Today's breathing test was normal with no improvement after the treatment.  . Let us know what Flovent dose you are taking.  . Daily controller medication(s): continue with Flovent 2 puffs twice a day with spacer and rinse mouth afterwards. . May use albuterol rescue inhaler 2 puffs every 4 to 6 hours as needed for shortness of breath, chest tightness, coughing, and wheezing. May use albuterol rescue inhaler 2 puffs 5 to 15 minutes prior to strenuous physical activities. Monitor frequency of use.  . Repeat spirometry at next visit.

## 2019-10-27 NOTE — Assessment & Plan Note (Signed)
" >>  ASSESSMENT AND PLAN FOR HEARTBURN WRITTEN ON 10/27/2019  4:46 PM BY LUKE NEEDLE M, DO  Significant reflux and takes Dexilant  and omeprazole . See below for heartburn lifestyle modifications. Continue with Dexilant  and omeprazole  as prescribed. "

## 2019-10-27 NOTE — Assessment & Plan Note (Signed)
Describes hive like rashes for the past 3 years. No triggers noted but worse after itching a particular area. Resolves within a few hours. Zyrtec seems to help.  No prior work up for this.  Keep track of itching/hives.  Take pictures when it flares up.  Try to take zyrtec 10mg  twice a day instead of zyrtec 20mg  every 2 days.  Avoid the following potential triggers: alcohol, tight clothing, NSAIDs.  Get bloodwork to rule out other etiologies.

## 2019-10-27 NOTE — Assessment & Plan Note (Signed)
Allergy testing a few years ago was positive to dust mites, trees, mold per patient report.  Declines repeat testing today.  The zyrtec as above should also help with these symptoms.  If noticing worsening symptoms recommend re-testing.  See below for environmental control measures.

## 2019-10-27 NOTE — Assessment & Plan Note (Signed)
Significant reflux and takes Dexilant and omeprazole.  See below for heartburn lifestyle modifications.  Continue with Dexilant and omeprazole as prescribed.

## 2019-10-27 NOTE — Assessment & Plan Note (Signed)
Some eczema patches on the leg.  Start proper skin care as below.  May use triamcinolone 0.1% ointment twice a day as needed for eczema flares. Do not use on the face, neck, armpits or groin area. Do not use more than 3 weeks in a row.

## 2019-10-27 NOTE — Patient Instructions (Addendum)
Breathing:  . Concerned about VCD - recommend ENT referral next.  . Today's breathing test was normal with no improvement after the treatment.  . Let us know what Flovent dose you are taking.  . Daily controller medication(s): continue with Flovent 2 puffs twice a day with spacer and rinse mouth afterwards. . May use albuterol rescue inhaler 2 puffs every 4 to 6 hours as needed for shortness of breath, chest tightness, coughing, and wheezing. May use albuterol rescue inhaler 2 puffs 5 to 15 minutes prior to strenuous physical activities. Monitor frequency of use.  . Breathing control goals:  o Full participation in all desired activities (may need albuterol before activity) o Albuterol use two times or less a week on average (not counting use with activity) o Cough interfering with sleep two times or less a month o Oral steroids no more than once a year o No hospitalizations  Rash:  Start proper skin care as below.  May use triamcinolone 0.1% ointment twice a day as needed for eczema flares. Do not use on the face, neck, armpits or groin area. Do not use more than 3 weeks in a row.    Keep track of itching/hives.  Take pictures when it flares up.  Try to take zyrtec 10mg  twice a day.  Avoid the following potential triggers: alcohol, tight clothing, NSAIDs.  Get bloodwork to rule out other causes.  We are ordering labs, so please allow 1-2 weeks for the results to come back. With the newly implemented Cures Act, the labs might be visible to you at the same time that they become visible to me. However, I will not address the results until all of the results are back, so please be patient.   Environmental allergies  The zyrtec as above should also help with your symptoms.  If noticing worsening symptoms recommend re-testing.  See below for environmental control measures.   Heartburn  See below for heartburn lifestyle modifications.  Continue with Dexilant and omeprazole as  prescribed.  Follow up in 3 months or sooner if needed.   Skin care recommendations  Bath time: . Always use lukewarm water. AVOID very hot or cold water. Marland Kitchen. Keep bathing time to 5-10 minutes. . Do NOT use bubble bath. . Use a mild soap and use just enough to wash the dirty areas. . Do NOT scrub skin vigorously.  . After bathing, pat dry your skin with a towel. Do NOT rub or scrub the skin.  Moisturizers and prescriptions:  . ALWAYS apply moisturizers immediately after bathing (within 3 minutes). This helps to lock-in moisture. . Use the moisturizer several times a day over the whole body. Peri Jefferson. Good summer moisturizers include: Aveeno, CeraVe, Cetaphil. Peri Jefferson. Good winter moisturizers include: Aquaphor, Vaseline, Cerave, Cetaphil, Eucerin, Vanicream. . When using moisturizers along with medications, the moisturizer should be applied about one hour after applying the medication to prevent diluting effect of the medication or moisturize around where you applied the medications. When not using medications, the moisturizer can be continued twice daily as maintenance.  Laundry and clothing: . Avoid laundry products with added color or perfumes. . Use unscented hypo-allergenic laundry products such as Tide free, Cheer free & gentle, and All free and clear.  . If the skin still seems dry or sensitive, you can try double-rinsing the clothes. . Avoid tight or scratchy clothing such as wool. . Do not use fabric softeners or dyer sheets.  Control of House Dust Mite Allergen . Dust mite allergens  are a common trigger of allergy and asthma symptoms. While they can be found throughout the house, these microscopic creatures thrive in warm, humid environments such as bedding, upholstered furniture and carpeting. . Because so much time is spent in the bedroom, it is essential to reduce mite levels there.  . Encase pillows, mattresses, and box springs in special allergen-proof fabric covers or airtight, zippered  plastic covers.  . Bedding should be washed weekly in hot water (130 F) and dried in a hot dryer. Allergen-proof covers are available for comforters and pillows that can't be regularly washed.  Reyes Ivan the allergy-proof covers every few months. Minimize clutter in the bedroom. Keep pets out of the bedroom.  Marland Kitchen Keep humidity less than 50% by using a dehumidifier or air conditioning. You can buy a humidity measuring device called a hygrometer to monitor this.  . If possible, replace carpets with hardwood, linoleum, or washable area rugs. If that's not possible, vacuum frequently with a vacuum that has a HEPA filter. . Remove all upholstered furniture and non-washable window drapes from the bedroom. . Remove all non-washable stuffed toys from the bedroom.  Wash stuffed toys weekly. Reducing Pollen Exposure . Pollen seasons: trees (spring), grass (summer) and ragweed/weeds (fall). Marland Kitchen Keep windows closed in your home and car to lower pollen exposure.  Lilian Kapur air conditioning in the bedroom and throughout the house if possible.  . Avoid going out in dry windy days - especially early morning. . Pollen counts are highest between 5 - 10 AM and on dry, hot and windy days.  . Save outside activities for late afternoon or after a heavy rain, when pollen levels are lower.  . Avoid mowing of grass if you have grass pollen allergy. Marland Kitchen Be aware that pollen can also be transported indoors on people and pets.  . Dry your clothes in an automatic dryer rather than hanging them outside where they might collect pollen.  . Rinse hair and eyes before bedtime. Mold Control . Mold and fungi can grow on a variety of surfaces provided certain temperature and moisture conditions exist.  . Outdoor molds grow on plants, decaying vegetation and soil. The major outdoor mold, Alternaria and Cladosporium, are found in very high numbers during hot and dry conditions. Generally, a late summer - fall peak is seen for common outdoor  fungal spores. Rain will temporarily lower outdoor mold spore count, but counts rise rapidly when the rainy period ends. . The most important indoor molds are Aspergillus and Penicillium. Dark, humid and poorly ventilated basements are ideal sites for mold growth. The next most common sites of mold growth are the bathroom and the kitchen. Outdoor (Seasonal) Mold Control . Use air conditioning and keep windows closed. . Avoid exposure to decaying vegetation. Marland Kitchen Avoid leaf raking. . Avoid grain handling. . Consider wearing a face mask if working in moldy areas.  Indoor (Perennial) Mold Control  . Maintain humidity below 50%. . Get rid of mold growth on hard surfaces with water, detergent and, if necessary, 5% bleach (do not mix with other cleaners). Then dry the area completely. If mold covers an area more than 10 square feet, consider hiring an indoor environmental professional. . For clothing, washing with soap and water is best. If moldy items cannot be cleaned and dried, throw them away. . Remove sources e.g. contaminated carpets. . Repair and seal leaking roofs or pipes. Using dehumidifiers in damp basements may be helpful, but empty the water and clean units  regularly to prevent mildew from forming. All rooms, especially basements, bathrooms and kitchens, require ventilation and cleaning to deter mold and mildew growth. Avoid carpeting on concrete or damp floors, and storing items in damp areas.  Heartburn Heartburn is a type of pain or discomfort that can happen in the throat or chest. It is often described as a burning pain. It may also cause a bad, acid-like taste in the mouth. Heartburn may feel worse when you lie down or bend over. It may be worse at night. It may be caused by stomach contents that move back up (reflux) into the tube that connects the mouth with the stomach (esophagus). Follow these instructions at home: Eating and drinking   Avoid certain foods and drinks as told by  your doctor. This may include: ? Coffee and tea (with or without caffeine). ? Drinks that have alcohol. ? Energy drinks and sports drinks. ? Carbonated drinks or sodas. ? Chocolate and cocoa. ? Peppermint and mint flavorings. ? Garlic and onions. ? Horseradish. ? Spicy and acidic foods, such as:  Peppers.  Chili powder and curry powder.  Vinegar.  Hot sauces and BBQ sauce. ? Citrus fruit juices and citrus fruits, such as:  Oranges.  Lemons.  Limes. ? Tomato-based foods, such as:  Red sauce and pizza with red sauce.  Chili.  Salsa. ? Fried and fatty foods, such as:  Donuts.  [place] fries and potato chips.  High-fat dressings. ? High-fat meats, such as:  Hot dogs and sausage.  Rib eye steak.  Ham and bacon. ? High-fat dairy items, such as:  Whole milk.  Butter.  Cream cheese.  Eat small meals often. Avoid eating large meals.  Avoid drinking large amounts of liquid with your meals.  Avoid eating meals during the 2-3 hours before bedtime.  Avoid lying down right after you eat.  Do not exercise right after you eat. Lifestyle      If you are overweight, lose an amount of weight that is healthy for you. Ask your doctor about a safe weight loss goal.  Do not use any products that contain nicotine or tobacco, including cigarettes, e-cigarettes, and chewing tobacco. These can make your symptoms worse. If you need help quitting, ask your doctor.  Wear loose clothes. Do not wear anything tight around your waist.  Raise (elevate) the head of your bed about 6 inches (15 cm) when you sleep.  Try to lower your stress. If you need help doing this, ask your doctor. General instructions  Pay attention to any changes in your symptoms.  Take over-the-counter and prescription medicines only as told by your doctor. ? Do not take aspirin, ibuprofen, or other NSAIDs unless your doctor says it is okay. ? Stop medicines only as told by your doctor.  Keep all  follow-up visits as told by your doctor. This is important. Contact a doctor if:  You have new symptoms.  You lose weight and you do not know why it is happening.  You have trouble swallowing, or it hurts to swallow.  You have wheezing or a cough that keeps happening.  Your symptoms do not get better with treatment.  You have heartburn often for more than 2 weeks. Get help right away if:  You have pain in your arms, neck, jaw, teeth, or back.  You feel sweaty, dizzy, or light-headed.  You have chest pain or shortness of breath.  You throw up (vomit) and your throw up looks like blood or coffee grounds.  Your poop (stool) is bloody or black. These symptoms may represent a serious problem that is an emergency. Do not wait to see if the symptoms will go away. Get medical help right away. Call your local emergency services (911 in the [place] Do not drive yourself to the hospital. Summary  Heartburn is a type of pain that can happen in the throat or chest. It can feel like a burning pain. It may also cause a bad, acid-like taste in the mouth.  You may need to avoid certain foods and drinks to help your symptoms. Ask your doctor what foods and drinks you should avoid.  Take over-the-counter and prescription medicines only as told by your doctor. Do not take aspirin, ibuprofen, or other NSAIDs unless your doctor told you to do so.  Contact your doctor if your symptoms do not get better or they get worse. This information is not intended to replace advice given to you by your health care provider. Make sure you discuss any questions you have with your health care provider. Document Revised: 06/08/2017 Document Reviewed: 06/08/2017 Elsevier Patient Education  2020 [employer].

## 2019-11-25 ENCOUNTER — Encounter: Payer: Self-pay | Admitting: Allergy

## 2019-11-28 MED ORDER — FLOVENT HFA 110 MCG/ACT IN AERO: INHALATION_SPRAY | RESPIRATORY_TRACT | 5 refills | Status: DC

## 2019-12-20 ENCOUNTER — Other Ambulatory Visit: Payer: Self-pay

## 2019-12-20 DIAGNOSIS — Z20822 Contact with and (suspected) exposure to covid-19: Secondary | ICD-10-CM

## 2019-12-21 LAB — NOVEL CORONAVIRUS, NAA: SARS-CoV-2, NAA: NOT DETECTED

## 2019-12-21 LAB — SARS-COV-2, NAA 2 DAY TAT

## 2020-11-20 DIAGNOSIS — Z9884 Bariatric surgery status: Secondary | ICD-10-CM

## 2020-11-20 HISTORY — PX: GASTRECTOMY, SLEEVE, ROBOT-ASSISTED: SHX7230

## 2020-11-20 HISTORY — DX: Bariatric surgery status: Z98.84

## 2021-01-02 ENCOUNTER — Other Ambulatory Visit: Payer: Self-pay | Admitting: Allergy

## 2021-03-27 DIAGNOSIS — Z6841 Body Mass Index (BMI) 40.0 and over, adult: Secondary | ICD-10-CM | POA: Diagnosis not present

## 2021-03-27 DIAGNOSIS — E7800 Pure hypercholesterolemia, unspecified: Secondary | ICD-10-CM | POA: Diagnosis not present

## 2021-03-27 DIAGNOSIS — E538 Deficiency of other specified B group vitamins: Secondary | ICD-10-CM | POA: Diagnosis not present

## 2021-03-27 DIAGNOSIS — M47817 Spondylosis without myelopathy or radiculopathy, lumbosacral region: Secondary | ICD-10-CM | POA: Diagnosis not present

## 2021-03-27 DIAGNOSIS — M545 Low back pain, unspecified: Secondary | ICD-10-CM | POA: Diagnosis not present

## 2021-03-27 DIAGNOSIS — Z7251 High risk heterosexual behavior: Secondary | ICD-10-CM | POA: Diagnosis not present

## 2021-03-27 DIAGNOSIS — R5383 Other fatigue: Secondary | ICD-10-CM | POA: Diagnosis not present

## 2021-03-27 DIAGNOSIS — Z131 Encounter for screening for diabetes mellitus: Secondary | ICD-10-CM | POA: Diagnosis not present

## 2021-03-27 DIAGNOSIS — E559 Vitamin D deficiency, unspecified: Secondary | ICD-10-CM | POA: Diagnosis not present

## 2021-04-11 DIAGNOSIS — Z9884 Bariatric surgery status: Secondary | ICD-10-CM | POA: Diagnosis not present

## 2021-04-11 DIAGNOSIS — Z713 Dietary counseling and surveillance: Secondary | ICD-10-CM | POA: Diagnosis not present

## 2021-04-11 DIAGNOSIS — Z6841 Body Mass Index (BMI) 40.0 and over, adult: Secondary | ICD-10-CM | POA: Diagnosis not present

## 2021-04-12 DIAGNOSIS — F410 Panic disorder [episodic paroxysmal anxiety] without agoraphobia: Secondary | ICD-10-CM | POA: Diagnosis not present

## 2021-04-12 DIAGNOSIS — F3181 Bipolar II disorder: Secondary | ICD-10-CM | POA: Diagnosis not present

## 2021-04-12 DIAGNOSIS — F909 Attention-deficit hyperactivity disorder, unspecified type: Secondary | ICD-10-CM | POA: Diagnosis not present

## 2021-04-12 DIAGNOSIS — F4312 Post-traumatic stress disorder, chronic: Secondary | ICD-10-CM | POA: Diagnosis not present

## 2021-04-17 DIAGNOSIS — R52 Pain, unspecified: Secondary | ICD-10-CM | POA: Diagnosis not present

## 2021-04-17 DIAGNOSIS — M47817 Spondylosis without myelopathy or radiculopathy, lumbosacral region: Secondary | ICD-10-CM | POA: Diagnosis not present

## 2021-04-17 DIAGNOSIS — Z7409 Other reduced mobility: Secondary | ICD-10-CM | POA: Diagnosis not present

## 2021-04-17 DIAGNOSIS — M6281 Muscle weakness (generalized): Secondary | ICD-10-CM | POA: Diagnosis not present

## 2021-05-07 DIAGNOSIS — M47817 Spondylosis without myelopathy or radiculopathy, lumbosacral region: Secondary | ICD-10-CM | POA: Diagnosis not present

## 2021-05-07 DIAGNOSIS — M545 Low back pain, unspecified: Secondary | ICD-10-CM | POA: Diagnosis not present

## 2021-05-07 DIAGNOSIS — M7918 Myalgia, other site: Secondary | ICD-10-CM | POA: Diagnosis not present

## 2021-05-09 ENCOUNTER — Emergency Department (HOSPITAL_BASED_OUTPATIENT_CLINIC_OR_DEPARTMENT_OTHER)
Admission: EM | Admit: 2021-05-09 | Discharge: 2021-05-09 | Disposition: A | Discharge status: Home / Self Care | Payer: BC Managed Care – PPO | Attending: Emergency Medicine | Admitting: Emergency Medicine

## 2021-05-09 ENCOUNTER — Emergency Department (HOSPITAL_BASED_OUTPATIENT_CLINIC_OR_DEPARTMENT_OTHER): Payer: BC Managed Care – PPO

## 2021-05-09 ENCOUNTER — Encounter (HOSPITAL_BASED_OUTPATIENT_CLINIC_OR_DEPARTMENT_OTHER): Payer: Self-pay | Admitting: Urology

## 2021-05-09 ENCOUNTER — Other Ambulatory Visit: Payer: Self-pay

## 2021-05-09 DIAGNOSIS — R109 Unspecified abdominal pain: Secondary | ICD-10-CM | POA: Diagnosis not present

## 2021-05-09 DIAGNOSIS — F3181 Bipolar II disorder: Secondary | ICD-10-CM | POA: Diagnosis not present

## 2021-05-09 DIAGNOSIS — F410 Panic disorder [episodic paroxysmal anxiety] without agoraphobia: Secondary | ICD-10-CM | POA: Diagnosis not present

## 2021-05-09 DIAGNOSIS — R7989 Other specified abnormal findings of blood chemistry: Secondary | ICD-10-CM | POA: Insufficient documentation

## 2021-05-09 DIAGNOSIS — R059 Cough, unspecified: Secondary | ICD-10-CM | POA: Diagnosis not present

## 2021-05-09 DIAGNOSIS — Z20822 Contact with and (suspected) exposure to covid-19: Secondary | ICD-10-CM | POA: Diagnosis not present

## 2021-05-09 DIAGNOSIS — R1012 Left upper quadrant pain: Secondary | ICD-10-CM | POA: Diagnosis not present

## 2021-05-09 DIAGNOSIS — F909 Attention-deficit hyperactivity disorder, unspecified type: Secondary | ICD-10-CM | POA: Diagnosis not present

## 2021-05-09 DIAGNOSIS — F4312 Post-traumatic stress disorder, chronic: Secondary | ICD-10-CM | POA: Diagnosis not present

## 2021-05-09 DIAGNOSIS — K920 Hematemesis: Secondary | ICD-10-CM | POA: Insufficient documentation

## 2021-05-09 DIAGNOSIS — N200 Calculus of kidney: Secondary | ICD-10-CM | POA: Diagnosis not present

## 2021-05-09 DIAGNOSIS — R1011 Right upper quadrant pain: Secondary | ICD-10-CM | POA: Insufficient documentation

## 2021-05-09 LAB — URINALYSIS, ROUTINE W REFLEX MICROSCOPIC
Bilirubin Urine: NEGATIVE
Glucose, UA: NEGATIVE mg/dL
Hgb urine dipstick: NEGATIVE
Ketones, ur: NEGATIVE mg/dL
Nitrite: NEGATIVE
Protein, ur: NEGATIVE mg/dL
Specific Gravity, Urine: 1.02 (ref 1.005–1.030)
pH: 5.5 (ref 5.0–8.0)

## 2021-05-09 LAB — CBC WITH DIFFERENTIAL/PLATELET
Abs Immature Granulocytes: 0.04 10*3/uL (ref 0.00–0.07)
Basophils Absolute: 0 10*3/uL (ref 0.0–0.1)
Basophils Relative: 0 %
Eosinophils Absolute: 0.1 10*3/uL (ref 0.0–0.5)
Eosinophils Relative: 1 %
HCT: 36.7 % (ref 36.0–46.0)
Hemoglobin: 12.5 g/dL (ref 12.0–15.0)
Immature Granulocytes: 0 %
Lymphocytes Relative: 18 %
Lymphs Abs: 1.7 10*3/uL (ref 0.7–4.0)
MCH: 29.5 pg (ref 26.0–34.0)
MCHC: 34.1 g/dL (ref 30.0–36.0)
MCV: 86.6 fL (ref 80.0–100.0)
Monocytes Absolute: 0.4 10*3/uL (ref 0.1–1.0)
Monocytes Relative: 4 %
Neutro Abs: 7.2 10*3/uL (ref 1.7–7.7)
Neutrophils Relative %: 77 %
Platelets: 260 10*3/uL (ref 150–400)
RBC: 4.24 MIL/uL (ref 3.87–5.11)
RDW: 13.7 % (ref 11.5–15.5)
WBC: 9.5 10*3/uL (ref 4.0–10.5)
nRBC: 0 % (ref 0.0–0.2)

## 2021-05-09 LAB — COMPREHENSIVE METABOLIC PANEL
ALT: 35 U/L (ref 0–44)
AST: 48 U/L — ABNORMAL HIGH (ref 15–41)
Albumin: 3.8 g/dL (ref 3.5–5.0)
Alkaline Phosphatase: 96 U/L (ref 38–126)
Anion gap: 6 (ref 5–15)
BUN: 14 mg/dL (ref 6–20)
CO2: 24 mmol/L (ref 22–32)
Calcium: 8.5 mg/dL — ABNORMAL LOW (ref 8.9–10.3)
Chloride: 107 mmol/L (ref 98–111)
Creatinine, Ser: 0.71 mg/dL (ref 0.44–1.00)
GFR, Estimated: >60 mL/min (ref >60)
Glucose, Bld: 106 mg/dL — ABNORMAL HIGH (ref 70–99)
Potassium: 3.2 mmol/L — ABNORMAL LOW (ref 3.5–5.1)
Sodium: 137 mmol/L (ref 135–145)
Total Bilirubin: 0.6 mg/dL (ref 0.3–1.2)
Total Protein: 6.9 g/dL (ref 6.5–8.1)

## 2021-05-09 LAB — URINALYSIS, MICROSCOPIC (REFLEX)

## 2021-05-09 LAB — RESP PANEL BY RT-PCR (FLU A&B, COVID) ARPGX2
Influenza A by PCR: NEGATIVE
Influenza B by PCR: NEGATIVE
SARS Coronavirus 2 by RT PCR: NEGATIVE

## 2021-05-09 LAB — LIPASE, BLOOD: Lipase: 83 U/L — ABNORMAL HIGH (ref 11–51)

## 2021-05-09 LAB — PREGNANCY, URINE: Preg Test, Ur: NEGATIVE

## 2021-05-09 MED ORDER — SUCRALFATE 1 GM/10ML PO SUSP: 1.0000 g | Freq: Three times a day (TID) | ORAL | 0 refills | Status: DC

## 2021-05-09 MED ORDER — DICYCLOMINE HCL 10 MG/ML IM SOLN
20.0000 mg | Freq: Once | INTRAMUSCULAR | Status: AC
Start: 2021-05-09 — End: 2021-05-09
  Administered 2021-05-09: 20 mg via INTRAMUSCULAR
  Filled 2021-05-09: qty 2

## 2021-05-09 MED ORDER — ONDANSETRON HCL 4 MG/2ML IJ SOLN
4.0000 mg | Freq: Once | INTRAMUSCULAR | Status: AC
  Administered 2021-05-09: 4 mg via INTRAVENOUS
  Filled 2021-05-09: qty 2

## 2021-05-09 MED ORDER — KETOROLAC TROMETHAMINE 30 MG/ML IJ SOLN
30.0000 mg | Freq: Once | INTRAMUSCULAR | Status: AC
  Administered 2021-05-09: 30 mg via INTRAVENOUS
  Filled 2021-05-09: qty 1

## 2021-05-09 MED ORDER — IOHEXOL 300 MG/ML  SOLN
125.0000 mL | Freq: Once | INTRAMUSCULAR | Status: AC | PRN
  Administered 2021-05-09: 125 mL via INTRAVENOUS

## 2021-05-09 MED ORDER — FENTANYL CITRATE PF 50 MCG/ML IJ SOSY
50.0000 ug | PREFILLED_SYRINGE | Freq: Once | INTRAMUSCULAR | Status: AC
Start: 2021-05-09 — End: 2021-05-09
  Administered 2021-05-09: 50 ug via INTRAVENOUS
  Filled 2021-05-09: qty 1

## 2021-05-09 MED ORDER — ALUM & MAG HYDROXIDE-SIMETH 200-200-20 MG/5ML PO SUSP
30.0000 mL | Freq: Once | ORAL | Status: AC
  Administered 2021-05-09: 30 mL via ORAL
  Filled 2021-05-09: qty 30

## 2021-05-09 NOTE — ED Provider Notes (Addendum)
?[facility] EMERGENCY DEPARTMENT ?Provider Note ? ? ?CSN: [id number] ?Arrival date & time: 05/09/21  0015 ? ?  ? ?History ? ?Chief Complaint  ?Patient presents with  ? Abdominal Pain  ? ? ?Claudia Walsh is a 41 y.o. female. ? ?The history is provided by the patient.  ?Abdominal Pain ?Pain location:  RUQ and LUQ ?Pain quality: cramping   ?Pain radiates to:  Does not radiate ?Pain severity:  Severe ?Onset quality:  Gradual ?Timing:  Constant ?Progression:  Unchanged ?Chronicity:  New ?Context: eating   ?Context comment:  Ate chicken and mac and cheese ?Relieved by:  Nothing ?Worsened by:  Nothing ?Ineffective treatments:  None tried ?Associated symptoms: cough and hematemesis   ?Associated symptoms: no anorexia, no chest pain, no diarrhea, no dysuria, no fever, no nausea and no vomiting   ?Risk factors: no aspirin use   ?Risk factors comment:  Duodenal switch ?Patient with a h/o duodenal switch, cholecystectomy and IBS presents with upper abdominal pain.  Originally stated she ate nothing out of the ordinary then stated she ate mac and cheese and chicken at 5 pm.  No vomiting.  No diarrhea.  Normal BMs.  No urinary symptoms.  Also reports cough today.  No fever or chills.  No nausea vomiting or diarrhea.  No urinary complaints.  Patient also stated that she took, her PPI with pepcid and tylenol with codeine syrup and well as gabapentin together before the pain started.   ?  ? ?Home Medications ?Prior to Admission medications   ?Medication Sig Start Date End Date Taking? Authorizing Provider  ?acetaminophen (TYLENOL) 500 MG tablet 1 tablet as needed    [provider]  ?albuterol (PROVENTIL HFA;VENTOLIN HFA) 108 (90 BASE) MCG/ACT inhaler Inhale 2 puffs into the lungs every 6 (six) hours as needed. For shortness of breath.    [provider]  ?Cetirizine HCl (ZYRTEC PO) Take by mouth.    [provider]  ?DEXILANT 30 MG capsule Take 1 capsule by mouth daily. 04/30/15   [provider]  ?fluticasone (FLOVENT HFA) 110 MCG/ACT inhaler Take 1-2 puffs twice a day with spacer and rinse mouth afterwards. 11/28/19   Garnet Sierras, DO  ?gabapentin (NEURONTIN) 300 MG capsule Take 300-600 mg by mouth at bedtime as needed. 10/12/19   [provider]  ?Methocarbamol (ROBAXIN PO) Take by mouth.    [provider]  ?Multiple Vitamins-Minerals (MULTIVITAMIN WOMEN PO) Take 1 tablet by mouth daily. ?Patient not taking: Reported on 10/27/2019    [provider]  ?omeprazole (PRILOSEC) 10 MG capsule 1 capsule 30 minutes before morning meal    [provider]  ?triamcinolone ointment (KENALOG) 0.1 % Apply 1 application topically 2 (two) times daily as needed. Do not use on the face, neck, armpits or groin area. Do not use more than 3 weeks in a row. 10/27/19   Garnet Sierras, DO  ?TRINTELLIX 10 MG TABS tablet Take by mouth. 07/06/19   [provider]  ?   ? ?Allergies    ?Depo-provera [medroxyprogesterone], Nuvaring [etonogestrel-ethinyl estradiol], Ortho-cyclen [norgestimate-eth estradiol], and Penicillins   ? ?Review of Systems   ?Review of Systems  ?Constitutional:  Negative for fever.  ?HENT:  Negative for facial swelling.   ?Eyes:  Negative for redness.  ?Respiratory:  Positive for cough.   ?Cardiovascular:  Negative for chest pain.  ?Gastrointestinal:  Positive for abdominal pain and hematemesis. Negative for anorexia, diarrhea, nausea and vomiting.  ?Genitourinary:  Negative for  dysuria.  ?Musculoskeletal:  Negative for neck pain.  ?All other systems reviewed and are negative. ? ?Physical Exam ?Updated Vital Signs ?BP 120/70 (BP Location: Left Arm) Comment: Simultaneous filing. User may not have seen previous data. Comment (BP Location): Simultaneous filing. User may not have seen previous data.  Pulse (!) 57 Comment: Simultaneous filing. User may not have seen previous data.  Temp 97.6 ?F (36.4 ?C) (Oral) Comment: Simultaneous filing. User may not have seen  previous data. Comment (Src): Simultaneous filing. User may not have seen previous data.  Resp 18 Comment: Simultaneous filing. User may not have seen previous data.  Ht 5' 4.5" (1.638 m)   Wt (!) 165.3 kg   SpO2 100% Comment: Simultaneous filing. User may not have seen previous data.  BMI 61.59 kg/m?  ?Physical Exam ?Vitals and nursing note reviewed. Exam conducted with a chaperone present.  ?Constitutional:   ?   Appearance: Normal appearance. She is not diaphoretic.  ?HENT:  ?   Head: Normocephalic and atraumatic.  ?   Nose: Nose normal.  ?Eyes:  ?   Pupils: Pupils are equal, round, and reactive to light.  ?Cardiovascular:  ?   Rate and Rhythm: Normal rate and regular rhythm.  ?   Pulses: Normal pulses.  ?   Heart sounds: Normal heart sounds.  ?Pulmonary:  ?   Effort: Pulmonary effort is normal.  ?   Breath sounds: Normal breath sounds.  ?Abdominal:  ?   General: Bowel sounds are normal.  ?   Palpations: Abdomen is soft.  ?   Tenderness: There is no abdominal tenderness. There is no guarding or rebound.  ?   Hernia: No hernia is present.  ?Musculoskeletal:     ?   General: Normal range of motion.  ?   Cervical back: Normal range of motion and neck supple.  ?Skin: ?   General: Skin is warm and dry.  ?   Capillary Refill: Capillary refill takes less than 2 seconds.  ?Neurological:  ?   General: No focal deficit present.  ?   Mental Status: She is alert and oriented to person, place, and time.  ?   Deep Tendon Reflexes: Reflexes normal.  ?Psychiatric:     ?   Mood and Affect: Mood normal.     ?   Behavior: Behavior normal.  ? ? ?ED Results / Procedures / Treatments   ?Labs ?(all labs ordered are listed, but only abnormal results are displayed) ?Results for orders placed or performed during the hospital encounter of 05/09/21  ?CBC with Differential  ?Result Value Ref Range  ? WBC 9.5 4.0 - 10.5 K/uL  ? RBC 4.24 3.87 - 5.11 MIL/uL  ? Hemoglobin 12.5 12.0 - 15.0 g/dL  ? HCT 36.7 36.0 - 46.0 %  ? MCV 86.6 80.0 -  100.0 fL  ? MCH 29.5 26.0 - 34.0 pg  ? MCHC 34.1 30.0 - 36.0 g/dL  ? RDW 13.7 11.5 - 15.5 %  ? Platelets 260 150 - 400 K/uL  ? nRBC 0.0 0.0 - 0.2 %  ? Neutrophils Relative % 77 %  ? Neutro Abs 7.2 1.7 - 7.7 K/uL  ? Lymphocytes Relative 18 %  ? Lymphs Abs 1.7 0.7 - 4.0 K/uL  ? Monocytes Relative 4 %  ? Monocytes Absolute 0.4 0.1 - 1.0 K/uL  ? Eosinophils Relative 1 %  ? Eosinophils Absolute 0.1 0.0 - 0.5 K/uL  ? Basophils Relative 0 %  ? Basophils Absolute 0.0 0.0 -  0.1 K/uL  ? Immature Granulocytes 0 %  ? Abs Immature Granulocytes 0.04 0.00 - 0.07 K/uL  ?Comprehensive metabolic panel  ?Result Value Ref Range  ? Sodium 137 135 - 145 mmol/L  ? Potassium 3.2 (L) 3.5 - 5.1 mmol/L  ? Chloride 107 98 - 111 mmol/L  ? CO2 24 22 - 32 mmol/L  ? Glucose, Bld 106 (H) 70 - 99 mg/dL  ? BUN 14 6 - 20 mg/dL  ? Creatinine, Ser 0.71 0.44 - 1.00 mg/dL  ? Calcium 8.5 (L) 8.9 - 10.3 mg/dL  ? Total Protein 6.9 6.5 - 8.1 g/dL  ? Albumin 3.8 3.5 - 5.0 g/dL  ? AST 48 (H) 15 - 41 U/L  ? ALT 35 0 - 44 U/L  ? Alkaline Phosphatase 96 38 - 126 U/L  ? Total Bilirubin 0.6 0.3 - 1.2 mg/dL  ? GFR, Estimated >60 >60 mL/min  ? Anion gap 6 5 - 15  ?Lipase, blood  ?Result Value Ref Range  ? Lipase 83 (H) 11 - 51 U/L  ?Urinalysis, Routine w reflex microscopic Urine, Clean Catch  ?Result Value Ref Range  ? Color, Urine YELLOW YELLOW  ? APPearance CLOUDY (A) CLEAR  ? Specific Gravity, Urine 1.020 1.005 - 1.030  ? pH 5.5 5.0 - 8.0  ? Glucose, UA NEGATIVE NEGATIVE mg/dL  ? Hgb urine dipstick NEGATIVE NEGATIVE  ? Bilirubin Urine NEGATIVE NEGATIVE  ? Ketones, ur NEGATIVE NEGATIVE mg/dL  ? Protein, ur NEGATIVE NEGATIVE mg/dL  ? Nitrite NEGATIVE NEGATIVE  ? Leukocytes,Ua TRACE (A) NEGATIVE  ?Pregnancy, urine  ?Result Value Ref Range  ? Preg Test, Ur NEGATIVE NEGATIVE  ?Urinalysis, Microscopic (reflex)  ?Result Value Ref Range  ? RBC / HPF 0-5 0 - 5 RBC/hpf  ? WBC, UA 6-10 0 - 5 WBC/hpf  ? Bacteria, UA FEW (A) NONE SEEN  ? Squamous Epithelial / LPF 0-5 0 - 5  ?  Mucus PRESENT   ? Hyaline Casts, UA PRESENT   ? ?No results found. ? ? ?Radiology ?No results found. ? ?Procedures ?Procedures  ? ? ?Medications Ordered in ED ?Medications  ?iohexol (OMNIPAQUE) 300 MG/ML solut

## 2021-05-09 NOTE — ED Triage Notes (Signed)
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

## 2021-05-13 DIAGNOSIS — R059 Cough, unspecified: Secondary | ICD-10-CM | POA: Diagnosis not present

## 2021-05-15 DIAGNOSIS — K582 Mixed irritable bowel syndrome: Secondary | ICD-10-CM | POA: Diagnosis not present

## 2021-05-20 DIAGNOSIS — M47817 Spondylosis without myelopathy or radiculopathy, lumbosacral region: Secondary | ICD-10-CM | POA: Diagnosis not present

## 2021-05-20 DIAGNOSIS — M6281 Muscle weakness (generalized): Secondary | ICD-10-CM | POA: Diagnosis not present

## 2021-05-20 DIAGNOSIS — R52 Pain, unspecified: Secondary | ICD-10-CM | POA: Diagnosis not present

## 2021-05-20 DIAGNOSIS — Z7409 Other reduced mobility: Secondary | ICD-10-CM | POA: Diagnosis not present

## 2021-05-29 DIAGNOSIS — M6281 Muscle weakness (generalized): Secondary | ICD-10-CM | POA: Diagnosis not present

## 2021-05-29 DIAGNOSIS — M47817 Spondylosis without myelopathy or radiculopathy, lumbosacral region: Secondary | ICD-10-CM | POA: Diagnosis not present

## 2021-05-29 DIAGNOSIS — R52 Pain, unspecified: Secondary | ICD-10-CM | POA: Diagnosis not present

## 2021-05-29 DIAGNOSIS — Z7409 Other reduced mobility: Secondary | ICD-10-CM | POA: Diagnosis not present

## 2021-06-04 DIAGNOSIS — G4730 Sleep apnea, unspecified: Secondary | ICD-10-CM | POA: Diagnosis not present

## 2021-06-04 DIAGNOSIS — F4312 Post-traumatic stress disorder, chronic: Secondary | ICD-10-CM | POA: Diagnosis not present

## 2021-06-04 DIAGNOSIS — F410 Panic disorder [episodic paroxysmal anxiety] without agoraphobia: Secondary | ICD-10-CM | POA: Diagnosis not present

## 2021-06-04 DIAGNOSIS — F909 Attention-deficit hyperactivity disorder, unspecified type: Secondary | ICD-10-CM | POA: Diagnosis not present

## 2021-06-06 DIAGNOSIS — L309 Dermatitis, unspecified: Secondary | ICD-10-CM | POA: Diagnosis not present

## 2021-06-06 DIAGNOSIS — M79672 Pain in left foot: Secondary | ICD-10-CM | POA: Diagnosis not present

## 2021-06-06 DIAGNOSIS — M79671 Pain in right foot: Secondary | ICD-10-CM | POA: Diagnosis not present

## 2021-06-06 DIAGNOSIS — L600 Ingrowing nail: Secondary | ICD-10-CM | POA: Diagnosis not present

## 2021-06-24 DIAGNOSIS — M79671 Pain in right foot: Secondary | ICD-10-CM | POA: Diagnosis not present

## 2021-06-24 DIAGNOSIS — L600 Ingrowing nail: Secondary | ICD-10-CM | POA: Diagnosis not present

## 2021-06-24 DIAGNOSIS — M79672 Pain in left foot: Secondary | ICD-10-CM | POA: Diagnosis not present

## 2021-06-25 DIAGNOSIS — F4312 Post-traumatic stress disorder, chronic: Secondary | ICD-10-CM | POA: Diagnosis not present

## 2021-06-25 DIAGNOSIS — F3181 Bipolar II disorder: Secondary | ICD-10-CM | POA: Diagnosis not present

## 2021-06-25 DIAGNOSIS — F410 Panic disorder [episodic paroxysmal anxiety] without agoraphobia: Secondary | ICD-10-CM | POA: Diagnosis not present

## 2021-06-27 DIAGNOSIS — M533 Sacrococcygeal disorders, not elsewhere classified: Secondary | ICD-10-CM | POA: Diagnosis not present

## 2021-06-27 DIAGNOSIS — M7918 Myalgia, other site: Secondary | ICD-10-CM | POA: Diagnosis not present

## 2021-06-27 DIAGNOSIS — M47817 Spondylosis without myelopathy or radiculopathy, lumbosacral region: Secondary | ICD-10-CM | POA: Diagnosis not present

## 2021-07-16 DIAGNOSIS — K449 Diaphragmatic hernia without obstruction or gangrene: Secondary | ICD-10-CM | POA: Diagnosis not present

## 2021-07-16 DIAGNOSIS — R112 Nausea with vomiting, unspecified: Secondary | ICD-10-CM | POA: Diagnosis not present

## 2021-07-16 DIAGNOSIS — K429 Umbilical hernia without obstruction or gangrene: Secondary | ICD-10-CM | POA: Diagnosis not present

## 2021-07-16 DIAGNOSIS — R197 Diarrhea, unspecified: Secondary | ICD-10-CM | POA: Diagnosis not present

## 2021-07-16 DIAGNOSIS — R1013 Epigastric pain: Secondary | ICD-10-CM | POA: Diagnosis not present

## 2021-07-16 DIAGNOSIS — N200 Calculus of kidney: Secondary | ICD-10-CM | POA: Diagnosis not present

## 2021-07-16 DIAGNOSIS — K760 Fatty (change of) liver, not elsewhere classified: Secondary | ICD-10-CM | POA: Diagnosis not present

## 2021-07-17 DIAGNOSIS — R002 Palpitations: Secondary | ICD-10-CM | POA: Diagnosis not present

## 2021-07-17 DIAGNOSIS — K449 Diaphragmatic hernia without obstruction or gangrene: Secondary | ICD-10-CM | POA: Diagnosis not present

## 2021-07-17 DIAGNOSIS — K760 Fatty (change of) liver, not elsewhere classified: Secondary | ICD-10-CM | POA: Diagnosis not present

## 2021-07-17 DIAGNOSIS — K429 Umbilical hernia without obstruction or gangrene: Secondary | ICD-10-CM | POA: Diagnosis not present

## 2021-07-17 DIAGNOSIS — N200 Calculus of kidney: Secondary | ICD-10-CM | POA: Diagnosis not present

## 2021-07-25 DIAGNOSIS — R002 Palpitations: Secondary | ICD-10-CM | POA: Diagnosis not present

## 2021-07-25 DIAGNOSIS — H6503 Acute serous otitis media, bilateral: Secondary | ICD-10-CM | POA: Diagnosis not present

## 2021-07-26 DIAGNOSIS — R001 Bradycardia, unspecified: Secondary | ICD-10-CM | POA: Diagnosis not present

## 2021-09-02 DIAGNOSIS — R5383 Other fatigue: Secondary | ICD-10-CM | POA: Diagnosis not present

## 2021-09-04 DIAGNOSIS — Z9884 Bariatric surgery status: Secondary | ICD-10-CM | POA: Diagnosis not present

## 2021-09-04 DIAGNOSIS — Z6841 Body Mass Index (BMI) 40.0 and over, adult: Secondary | ICD-10-CM | POA: Diagnosis not present

## 2021-09-04 DIAGNOSIS — R002 Palpitations: Secondary | ICD-10-CM | POA: Diagnosis not present

## 2021-09-17 DIAGNOSIS — M9902 Segmental and somatic dysfunction of thoracic region: Secondary | ICD-10-CM | POA: Diagnosis not present

## 2021-09-17 DIAGNOSIS — M9905 Segmental and somatic dysfunction of pelvic region: Secondary | ICD-10-CM | POA: Diagnosis not present

## 2021-09-17 DIAGNOSIS — M5432 Sciatica, left side: Secondary | ICD-10-CM | POA: Diagnosis not present

## 2021-09-17 DIAGNOSIS — M62452 Contracture of muscle, left thigh: Secondary | ICD-10-CM | POA: Diagnosis not present

## 2021-09-24 DIAGNOSIS — Z9049 Acquired absence of other specified parts of digestive tract: Secondary | ICD-10-CM | POA: Diagnosis not present

## 2021-09-24 DIAGNOSIS — R748 Abnormal levels of other serum enzymes: Secondary | ICD-10-CM | POA: Diagnosis not present

## 2021-09-25 DIAGNOSIS — I517 Cardiomegaly: Secondary | ICD-10-CM | POA: Diagnosis not present

## 2021-09-30 DIAGNOSIS — R002 Palpitations: Secondary | ICD-10-CM | POA: Diagnosis not present

## 2021-10-01 DIAGNOSIS — R002 Palpitations: Secondary | ICD-10-CM | POA: Diagnosis not present

## 2021-10-09 DIAGNOSIS — Z113 Encounter for screening for infections with a predominantly sexual mode of transmission: Secondary | ICD-10-CM | POA: Diagnosis not present

## 2021-10-09 DIAGNOSIS — Z6841 Body Mass Index (BMI) 40.0 and over, adult: Secondary | ICD-10-CM | POA: Diagnosis not present

## 2021-10-09 DIAGNOSIS — L680 Hirsutism: Secondary | ICD-10-CM | POA: Diagnosis not present

## 2021-10-09 DIAGNOSIS — Z124 Encounter for screening for malignant neoplasm of cervix: Secondary | ICD-10-CM | POA: Diagnosis not present

## 2021-10-09 DIAGNOSIS — Z01411 Encounter for gynecological examination (general) (routine) with abnormal findings: Secondary | ICD-10-CM | POA: Diagnosis not present

## 2021-10-09 LAB — HM PAP SMEAR
HM Pap smear: NORMAL
HPV, high-risk: NEGATIVE

## 2021-10-09 LAB — RESULTS CONSOLE HPV: CHL HPV: NEGATIVE

## 2021-10-21 DIAGNOSIS — J45901 Unspecified asthma with (acute) exacerbation: Secondary | ICD-10-CM | POA: Diagnosis not present

## 2021-10-21 DIAGNOSIS — H6691 Otitis media, unspecified, right ear: Secondary | ICD-10-CM | POA: Diagnosis not present

## 2021-10-29 DIAGNOSIS — G4733 Obstructive sleep apnea (adult) (pediatric): Secondary | ICD-10-CM | POA: Diagnosis not present

## 2021-10-30 ENCOUNTER — Ambulatory Visit (INDEPENDENT_AMBULATORY_CARE_PROVIDER_SITE_OTHER): Payer: BC Managed Care – PPO | Admitting: Internal Medicine

## 2021-10-30 ENCOUNTER — Encounter: Payer: Self-pay | Admitting: Internal Medicine

## 2021-10-30 VITALS — BP 114/66 | HR 53 | Temp 97.8°F | Resp 17 | Ht 64.0 in | Wt 255.3 lb

## 2021-10-30 DIAGNOSIS — K580 Irritable bowel syndrome with diarrhea: Secondary | ICD-10-CM

## 2021-10-30 DIAGNOSIS — K219 Gastro-esophageal reflux disease without esophagitis: Secondary | ICD-10-CM | POA: Diagnosis not present

## 2021-10-30 DIAGNOSIS — J3089 Other allergic rhinitis: Secondary | ICD-10-CM

## 2021-10-30 DIAGNOSIS — J4540 Moderate persistent asthma, uncomplicated: Secondary | ICD-10-CM

## 2021-10-30 DIAGNOSIS — L2084 Intrinsic (allergic) eczema: Secondary | ICD-10-CM

## 2021-10-30 MED ORDER — EUCRISA 2 % EX OINT: TOPICAL_OINTMENT | CUTANEOUS | Status: DC

## 2021-10-30 MED ORDER — EUCRISA 2 % EX OINT: TOPICAL_OINTMENT | CUTANEOUS | 6 refills | Status: DC

## 2021-10-30 MED ORDER — SYMBICORT 160-4.5 MCG/ACT IN AERO: 2.0000 | INHALATION_SPRAY | Freq: Two times a day (BID) | RESPIRATORY_TRACT | 6 refills | Status: DC

## 2021-10-30 MED ORDER — BUDESONIDE-FORMOTEROL FUMARATE 160-4.5 MCG/ACT IN AERO: 2.0000 | INHALATION_SPRAY | Freq: Two times a day (BID) | RESPIRATORY_TRACT | 6 refills | Status: DC

## 2021-10-30 NOTE — Patient Instructions (Addendum)
Reactive airway disease, moderate persistent, uncomplicated Today's spirometry was normal  Daily controller medication(s): START Symbicort 2 puffs twice a day with spacer and rinse mouth afterwards. May use albuterol rescue inhaler 2 puffs every 4 to 6 hours as needed for shortness of breath, chest tightness, coughing, and wheezing. May use albuterol rescue inhaler 2 puffs 5 to 15 minutes prior to strenuous physical activities. Monitor frequency of use.   Other atopic dermatitis Some eczema patches on foot  Continue basic skin care Try Eucrisa ointment twice day   Other allergic rhinitis Allergy testing a few years ago was positive to dust mites, trees, mold per patient report. Continue Hydroxyzine nightly and Singulair 10mg  nightly to control symptoms  Continue avoidance measures   Heartburn Continue heartburn lifestyle modifications. Continue with famotidine and omeprazole as prescribed.  IBS - Try low FODMAP diet  - Try imodium prior to events   Follow up: 6 months   Thank you so much for letting me partake in your care today.  Don't hesitate to reach out if you have any additional concerns!  Roney Marion, MD  [facility] [place], [place]

## 2021-10-30 NOTE — Progress Notes (Signed)
Follow Up Note  RE: Claudia Walsh MRN: [id number] DOB: 12/19/1980 Date of Office Visit: 10/30/2021  Referring provider: Shella Spearing, PA-C Primary care provider: Shella Spearing, PA-C  Chief Complaint: Follow-up, Asthma, and Medication Refill  History of Present Illness: I had the pleasure of seeing Claudia Walsh for a follow up visit at the [facility] on 10/30/2021. She is a 41 y.o. female, who is being followed for asthma, rhinitis, GERD, rashes, atopic dermatitis . Her previous allergy office visit was on 10/27/19  with Dr. Maudie Mercury. Today is a regular follow up visit.  History obtained from patient, chart review.  ASTHMA:  - Medical therapy: Singulair 10mg  daily, Flovent 177mcg 2 puffs twice daily (not washing mouth but spacer)  - Rescue inhaler use: using more over the past few  - Symptoms: worsening symptoms a year ago due to weight gain, which improved with weight loss.  Recenlty diagnosed with bronchitis  which was triggered by URI (usually occurs 1-2 times)  - Exacerbation history: 1 ABX for respiratory illness since last visit, 1 OCS, 0ED, 1 UC visits in the past year  - ACT: 18 /25 - Adverse effects of medication: denies  - Previous FEV1: 3.30 L, 104% - Biologic Labs not done    Atopic dermatitis: flares mostly feet in blisters, but randomly stopped without intervention,  Now with dry patch on bottom of her foot.  -current regimen: vaseline for emollient, has tried clobetasol, triamcinolone without good response current dry patch on her foot -reports use of fragrance/dye free products - sleep un affected - itch controlled  Allergic rhinitis: current therapy: singulair 10mg  daily, hydroxyzine,  symptoms improved symptoms include:  denies any breakthrough nasal or ocular symptoms  Previous allergy testing: yes History of reflux/heartburn: yes currently on omeprazole and famotidine with good control Interested in Allergy  Immunotherapy: no  She has IBS and is interested in food sensitivity testing.  She understands that food sensitization testing is not helpful in food intolerance diagnosis.  She has not tried low FODMAP diet.  She has not tried Imodium prior to events to prevent flares.  Assessment and Plan: Claudia Walsh is a 41 y.o. female with: Moderate persistent asthma without complication  Other allergic rhinitis  Intrinsic atopic dermatitis  Gastroesophageal reflux disease without esophagitis  Irritable bowel syndrome with diarrhea Plan: Patient Instructions  Reactive airway disease, moderate persistent, uncomplicated Today's spirometry was normal  Daily controller medication(s): START Symbicort 2 puffs twice a day with spacer and rinse mouth afterwards. May use albuterol rescue inhaler 2 puffs every 4 to 6 hours as needed for shortness of breath, chest tightness, coughing, and wheezing. May use albuterol rescue inhaler 2 puffs 5 to 15 minutes prior to strenuous physical activities. Monitor frequency of use.   Other atopic dermatitis Some eczema patches on foot  Continue basic skin care Try Eucrisa ointment twice day   Other allergic rhinitis Allergy testing a few years ago was positive to dust mites, trees, mold per patient report. Continue Hydroxyzine nightly and Singulair 10mg  nightly to control symptoms  Continue avoidance measures   Heartburn Continue heartburn lifestyle modifications. Continue with famotidine and omeprazole as prescribed.  IBS - Try low FODMAP diet  - Try imodium prior to events   Follow up: 6 months   Thank you so much for letting me partake in your care today.  Don't hesitate to reach out if you have any additional concerns!  Roney Marion, MD  [facility]  [place]  No follow-ups on file.  Meds ordered this encounter  Medications   DISCONTD: budesonide-formoterol (SYMBICORT) 160-4.5 MCG/ACT inhaler    Sig: Inhale 2 puffs into the  lungs in the morning and at bedtime.    Dispense:  1 each    Refill:  6   DISCONTD: Crisaborole (EUCRISA) 2 % OINT    Sig: Apply to affected area twice daily as needed    Dispense:  100 g    Refill:  G   SYMBICORT 160-4.5 MCG/ACT inhaler    Sig: Inhale 2 puffs into the lungs in the morning and at bedtime.    Dispense:  1 each    Refill:  6   Crisaborole (EUCRISA) 2 % OINT    Sig: Apply to affected area twice daily as needed    Dispense:  100 g    Refill:  6    Lab Orders  No laboratory test(s) ordered today   Diagnostics: Spirometry:  Tracings reviewed. Her effort: Good reproducible efforts. FVC: 3.97 L FEV1: 3.27 L, 108% predicted FEV1/FVC ratio: 82% Interpretation: Spirometry consistent with normal pattern.  Please see scanned spirometry results for details.   Results interpreted by myself during this encounter and discussed with patient/family.   Medication List:  Current Outpatient Medications  Medication Sig Dispense Refill   acetaminophen (TYLENOL) 500 MG tablet 1 tablet as needed     acyclovir (ZOVIRAX) 400 MG tablet Take 400 mg by mouth as needed.     albuterol (PROVENTIL HFA;VENTOLIN HFA) 108 (90 BASE) MCG/ACT inhaler Inhale 2 puffs into the lungs every 6 (six) hours as needed. For shortness of breath.     Armodafinil 150 MG tablet Take 150 mg by mouth every morning.     busPIRone (BUSPAR) 10 MG tablet Take by mouth.     cefdinir (OMNICEF) 300 MG capsule Take 300 mg by mouth 2 (two) times daily.     famotidine (PEPCID) 40 MG tablet Take 40 mg by mouth at bedtime.     fluticasone (FLOVENT HFA) 110 MCG/ACT inhaler Take 1-2 puffs twice a day with spacer and rinse mouth afterwards. 1 each 5   gabapentin (NEURONTIN) 100 MG capsule Take by mouth.     hydrOXYzine (ATARAX) 25 MG tablet Take by mouth.     levonorgestrel (MIRENA) 20 MCG/DAY IUD See admin instructions.     montelukast (SINGULAIR) 10 MG tablet Take 10 mg by mouth daily.     Multiple Vitamins-Minerals  (MULTIVITAMIN WOMEN PO) Take 1 tablet by mouth daily.     omeprazole (PRILOSEC) 40 MG capsule Take 40 mg by mouth 2 (two) times daily.     ondansetron (ZOFRAN) 4 MG tablet Take 4 mg by mouth as needed.     ramelteon (ROZEREM) 8 MG tablet Take 8 mg by mouth at bedtime.     triamcinolone ointment (KENALOG) 0.1 % Apply 1 application topically 2 (two) times daily as needed. Do not use on the face, neck, armpits or groin area. Do not use more than 3 weeks in a row. 30 g 2   Crisaborole (EUCRISA) 2 % OINT Apply to affected area twice daily as needed 100 g 6   SYMBICORT 160-4.5 MCG/ACT inhaler Inhale 2 puffs into the lungs in the morning and at bedtime. 1 each 6   No current facility-administered medications for this visit.   Allergies: Allergies  Allergen Reactions   Aspirin Other (See Comments)    Nose bleeds Nose bleeds    Nsaids Other (See  Comments)   Azithromycin Rash   Depo-Provera [Medroxyprogesterone]     Weight gain   Nuvaring [Etonogestrel-Ethinyl Estradiol]     Rash on face,mood swings   Ortho-Cyclen [Norgestimate-Eth Estradiol]    Penicillins Rash   I reviewed her past medical history, social history, family history, and environmental history and no significant changes have been reported from her previous visit.  ROS: All others negative except as noted per HPI.   Objective: BP 114/66   Pulse (!) 53   Temp 97.8 F (36.6 C) (Temporal)   Resp 17   Ht 5\' 4"  (1.626 m)   Wt 255 lb 4.8 oz (115.8 kg)   SpO2 99%   BMI 43.82 kg/m  Body mass index is 43.82 kg/m. General Appearance:  Alert, cooperative, no distress, appears stated age  Head:  Normocephalic, without obvious abnormality, atraumatic  Eyes:  Conjunctiva clear, EOM's intact  Nose: Nares normal, normal mucosa, no visible anterior polyps, and septum midline  Throat: Lips, tongue normal; teeth and gums normal, normal posterior oropharynx and no tonsillar exudate  Neck: Supple, symmetrical  Lungs:   clear to  auscultation bilaterally, Respirations unlabored, no coughing  Heart:  regular rate and rhythm and no murmur, Appears well perfused  Extremities: No edema  Skin: Skin color, texture, turgor normal, no rashes or lesions on visualized portions of skin "quarter sized dry patch on the bottom of left foot  Neurologic: No gross deficits   Previous notes and tests were reviewed. The plan was reviewed with the patient/family, and all questions/concerned were addressed.  It was my pleasure to see Sa today and participate in her care. Please feel free to contact me with any questions or concerns.  Sincerely,  Roney Marion, MD  Allergy & Immunology  [facility] Office: [phone]

## 2021-10-31 ENCOUNTER — Other Ambulatory Visit: Payer: Self-pay

## 2021-10-31 MED ORDER — EUCRISA 2 % EX OINT: TOPICAL_OINTMENT | CUTANEOUS | 6 refills | Status: DC

## 2021-10-31 NOTE — Addendum Note (Signed)
Addended by: Felipa Emory on: 10/31/2021 11:47 AM   Modules accepted: Orders

## 2021-11-14 DIAGNOSIS — J342 Deviated nasal septum: Secondary | ICD-10-CM | POA: Diagnosis not present

## 2021-11-14 DIAGNOSIS — H9201 Otalgia, right ear: Secondary | ICD-10-CM | POA: Diagnosis not present

## 2021-11-14 DIAGNOSIS — G4733 Obstructive sleep apnea (adult) (pediatric): Secondary | ICD-10-CM | POA: Diagnosis not present

## 2021-11-14 DIAGNOSIS — H903 Sensorineural hearing loss, bilateral: Secondary | ICD-10-CM | POA: Diagnosis not present

## 2021-11-20 HISTORY — PX: LAPAROSCOPIC CHOLECYSTECTOMY: SUR755

## 2021-11-27 DIAGNOSIS — M9905 Segmental and somatic dysfunction of pelvic region: Secondary | ICD-10-CM | POA: Diagnosis not present

## 2021-11-27 DIAGNOSIS — M9902 Segmental and somatic dysfunction of thoracic region: Secondary | ICD-10-CM | POA: Diagnosis not present

## 2021-11-27 DIAGNOSIS — M5432 Sciatica, left side: Secondary | ICD-10-CM | POA: Diagnosis not present

## 2021-11-27 DIAGNOSIS — M62452 Contracture of muscle, left thigh: Secondary | ICD-10-CM | POA: Diagnosis not present

## 2021-11-29 DIAGNOSIS — G4733 Obstructive sleep apnea (adult) (pediatric): Secondary | ICD-10-CM | POA: Diagnosis not present

## 2021-12-09 DIAGNOSIS — F4312 Post-traumatic stress disorder, chronic: Secondary | ICD-10-CM | POA: Diagnosis not present

## 2021-12-24 DIAGNOSIS — M9905 Segmental and somatic dysfunction of pelvic region: Secondary | ICD-10-CM | POA: Diagnosis not present

## 2021-12-24 DIAGNOSIS — M9902 Segmental and somatic dysfunction of thoracic region: Secondary | ICD-10-CM | POA: Diagnosis not present

## 2021-12-24 DIAGNOSIS — M5432 Sciatica, left side: Secondary | ICD-10-CM | POA: Diagnosis not present

## 2021-12-24 DIAGNOSIS — M62452 Contracture of muscle, left thigh: Secondary | ICD-10-CM | POA: Diagnosis not present

## 2021-12-29 DIAGNOSIS — G4733 Obstructive sleep apnea (adult) (pediatric): Secondary | ICD-10-CM | POA: Diagnosis not present

## 2021-12-31 DIAGNOSIS — F3181 Bipolar II disorder: Secondary | ICD-10-CM | POA: Diagnosis not present

## 2021-12-31 DIAGNOSIS — F4312 Post-traumatic stress disorder, chronic: Secondary | ICD-10-CM | POA: Diagnosis not present

## 2021-12-31 DIAGNOSIS — G4730 Sleep apnea, unspecified: Secondary | ICD-10-CM | POA: Diagnosis not present

## 2022-01-02 DIAGNOSIS — F39 Unspecified mood [affective] disorder: Secondary | ICD-10-CM | POA: Diagnosis not present

## 2022-01-07 DIAGNOSIS — M62452 Contracture of muscle, left thigh: Secondary | ICD-10-CM | POA: Diagnosis not present

## 2022-01-07 DIAGNOSIS — M9902 Segmental and somatic dysfunction of thoracic region: Secondary | ICD-10-CM | POA: Diagnosis not present

## 2022-01-07 DIAGNOSIS — M5432 Sciatica, left side: Secondary | ICD-10-CM | POA: Diagnosis not present

## 2022-01-07 DIAGNOSIS — M9905 Segmental and somatic dysfunction of pelvic region: Secondary | ICD-10-CM | POA: Diagnosis not present

## 2022-01-20 DIAGNOSIS — J383 Other diseases of vocal cords: Secondary | ICD-10-CM

## 2022-01-20 HISTORY — DX: Other diseases of vocal cords: J38.3

## 2022-01-21 ENCOUNTER — Other Ambulatory Visit: Payer: Self-pay | Admitting: Internal Medicine

## 2022-01-21 NOTE — Telephone Encounter (Signed)
Lets change to Breo 153mcg 1 puff daily.  Thanks!

## 2022-01-27 DIAGNOSIS — F39 Unspecified mood [affective] disorder: Secondary | ICD-10-CM | POA: Diagnosis not present

## 2022-01-30 DIAGNOSIS — F3181 Bipolar II disorder: Secondary | ICD-10-CM | POA: Diagnosis not present

## 2022-01-30 DIAGNOSIS — G4730 Sleep apnea, unspecified: Secondary | ICD-10-CM | POA: Diagnosis not present

## 2022-01-30 DIAGNOSIS — F411 Generalized anxiety disorder: Secondary | ICD-10-CM | POA: Diagnosis not present

## 2022-02-03 DIAGNOSIS — F39 Unspecified mood [affective] disorder: Secondary | ICD-10-CM | POA: Diagnosis not present

## 2022-02-18 DIAGNOSIS — F39 Unspecified mood [affective] disorder: Secondary | ICD-10-CM | POA: Diagnosis not present

## 2022-02-26 DIAGNOSIS — G4733 Obstructive sleep apnea (adult) (pediatric): Secondary | ICD-10-CM | POA: Diagnosis not present

## 2022-02-28 DIAGNOSIS — F39 Unspecified mood [affective] disorder: Secondary | ICD-10-CM | POA: Diagnosis not present

## 2022-03-17 DIAGNOSIS — F39 Unspecified mood [affective] disorder: Secondary | ICD-10-CM | POA: Diagnosis not present

## 2022-03-26 DIAGNOSIS — Z049 Encounter for examination and observation for unspecified reason: Secondary | ICD-10-CM | POA: Diagnosis not present

## 2022-03-26 DIAGNOSIS — G43719 Chronic migraine without aura, intractable, without status migrainosus: Secondary | ICD-10-CM | POA: Diagnosis not present

## 2022-03-27 DIAGNOSIS — G4733 Obstructive sleep apnea (adult) (pediatric): Secondary | ICD-10-CM | POA: Diagnosis not present

## 2022-03-31 DIAGNOSIS — F411 Generalized anxiety disorder: Secondary | ICD-10-CM | POA: Diagnosis not present

## 2022-03-31 DIAGNOSIS — G4723 Circadian rhythm sleep disorder, irregular sleep wake type: Secondary | ICD-10-CM | POA: Diagnosis not present

## 2022-03-31 DIAGNOSIS — F340 Cyclothymic disorder: Secondary | ICD-10-CM | POA: Diagnosis not present

## 2022-03-31 DIAGNOSIS — G4730 Sleep apnea, unspecified: Secondary | ICD-10-CM | POA: Diagnosis not present

## 2022-03-31 DIAGNOSIS — F39 Unspecified mood [affective] disorder: Secondary | ICD-10-CM | POA: Diagnosis not present

## 2022-04-08 DIAGNOSIS — G43719 Chronic migraine without aura, intractable, without status migrainosus: Secondary | ICD-10-CM | POA: Diagnosis not present

## 2022-04-08 DIAGNOSIS — M791 Myalgia, unspecified site: Secondary | ICD-10-CM | POA: Diagnosis not present

## 2022-04-08 DIAGNOSIS — M542 Cervicalgia: Secondary | ICD-10-CM | POA: Diagnosis not present

## 2022-04-08 DIAGNOSIS — G518 Other disorders of facial nerve: Secondary | ICD-10-CM | POA: Diagnosis not present

## 2022-04-27 DIAGNOSIS — G4733 Obstructive sleep apnea (adult) (pediatric): Secondary | ICD-10-CM | POA: Diagnosis not present

## 2022-04-29 DIAGNOSIS — F3181 Bipolar II disorder: Secondary | ICD-10-CM | POA: Diagnosis not present

## 2022-04-29 DIAGNOSIS — Z79899 Other long term (current) drug therapy: Secondary | ICD-10-CM | POA: Diagnosis not present

## 2022-05-01 DIAGNOSIS — G43719 Chronic migraine without aura, intractable, without status migrainosus: Secondary | ICD-10-CM | POA: Diagnosis not present

## 2022-05-01 DIAGNOSIS — M542 Cervicalgia: Secondary | ICD-10-CM | POA: Diagnosis not present

## 2022-05-01 DIAGNOSIS — G518 Other disorders of facial nerve: Secondary | ICD-10-CM | POA: Diagnosis not present

## 2022-05-01 DIAGNOSIS — M791 Myalgia, unspecified site: Secondary | ICD-10-CM | POA: Diagnosis not present

## 2022-05-05 DIAGNOSIS — F39 Unspecified mood [affective] disorder: Secondary | ICD-10-CM | POA: Diagnosis not present

## 2022-05-19 DIAGNOSIS — F39 Unspecified mood [affective] disorder: Secondary | ICD-10-CM | POA: Diagnosis not present

## 2022-05-20 DIAGNOSIS — F340 Cyclothymic disorder: Secondary | ICD-10-CM | POA: Diagnosis not present

## 2022-05-20 DIAGNOSIS — Z79899 Other long term (current) drug therapy: Secondary | ICD-10-CM | POA: Diagnosis not present

## 2022-06-02 DIAGNOSIS — K2100 Gastro-esophageal reflux disease with esophagitis, without bleeding: Secondary | ICD-10-CM | POA: Diagnosis not present

## 2022-06-02 DIAGNOSIS — F39 Unspecified mood [affective] disorder: Secondary | ICD-10-CM | POA: Diagnosis not present

## 2022-06-02 DIAGNOSIS — F332 Major depressive disorder, recurrent severe without psychotic features: Secondary | ICD-10-CM | POA: Diagnosis not present

## 2022-06-02 DIAGNOSIS — J4520 Mild intermittent asthma, uncomplicated: Secondary | ICD-10-CM | POA: Diagnosis not present

## 2022-06-02 DIAGNOSIS — R002 Palpitations: Secondary | ICD-10-CM | POA: Diagnosis not present

## 2022-06-03 DIAGNOSIS — G518 Other disorders of facial nerve: Secondary | ICD-10-CM | POA: Diagnosis not present

## 2022-06-03 DIAGNOSIS — G43719 Chronic migraine without aura, intractable, without status migrainosus: Secondary | ICD-10-CM | POA: Diagnosis not present

## 2022-06-03 DIAGNOSIS — M791 Myalgia, unspecified site: Secondary | ICD-10-CM | POA: Diagnosis not present

## 2022-06-03 DIAGNOSIS — M542 Cervicalgia: Secondary | ICD-10-CM | POA: Diagnosis not present

## 2022-06-07 ENCOUNTER — Emergency Department (HOSPITAL_COMMUNITY)
Admission: EM | Admit: 2022-06-07 | Discharge: 2022-06-08 | Disposition: A | Discharge status: Home / Self Care | Payer: BC Managed Care – PPO | Source: Home / Self Care | Attending: Emergency Medicine | Admitting: Emergency Medicine

## 2022-06-07 ENCOUNTER — Other Ambulatory Visit: Payer: Self-pay

## 2022-06-07 ENCOUNTER — Emergency Department (HOSPITAL_COMMUNITY): Payer: BC Managed Care – PPO

## 2022-06-07 ENCOUNTER — Encounter (HOSPITAL_COMMUNITY): Payer: Self-pay

## 2022-06-07 ENCOUNTER — Emergency Department (HOSPITAL_COMMUNITY)
Admission: EM | Admit: 2022-06-07 | Discharge: 2022-06-07 | Disposition: A | Discharge status: Home / Self Care | Payer: BC Managed Care – PPO | Attending: Emergency Medicine | Admitting: Emergency Medicine

## 2022-06-07 DIAGNOSIS — J45909 Unspecified asthma, uncomplicated: Secondary | ICD-10-CM | POA: Insufficient documentation

## 2022-06-07 DIAGNOSIS — R1084 Generalized abdominal pain: Secondary | ICD-10-CM | POA: Diagnosis not present

## 2022-06-07 DIAGNOSIS — K5730 Diverticulosis of large intestine without perforation or abscess without bleeding: Secondary | ICD-10-CM | POA: Diagnosis not present

## 2022-06-07 DIAGNOSIS — Z7951 Long term (current) use of inhaled steroids: Secondary | ICD-10-CM | POA: Insufficient documentation

## 2022-06-07 DIAGNOSIS — R109 Unspecified abdominal pain: Secondary | ICD-10-CM | POA: Diagnosis not present

## 2022-06-07 DIAGNOSIS — N201 Calculus of ureter: Secondary | ICD-10-CM | POA: Insufficient documentation

## 2022-06-07 DIAGNOSIS — N23 Unspecified renal colic: Secondary | ICD-10-CM | POA: Insufficient documentation

## 2022-06-07 DIAGNOSIS — N202 Calculus of kidney with calculus of ureter: Secondary | ICD-10-CM | POA: Diagnosis not present

## 2022-06-07 DIAGNOSIS — R110 Nausea: Secondary | ICD-10-CM | POA: Diagnosis not present

## 2022-06-07 LAB — URINALYSIS, W/ REFLEX TO CULTURE (INFECTION SUSPECTED)
Bacteria, UA: NONE SEEN
Bilirubin Urine: NEGATIVE
Glucose, UA: NEGATIVE mg/dL
Ketones, ur: NEGATIVE mg/dL
Leukocytes,Ua: NEGATIVE
Nitrite: NEGATIVE
Protein, ur: NEGATIVE mg/dL
RBC / HPF: >50 RBC/hpf (ref 0–5)
Specific Gravity, Urine: 1.026 (ref 1.005–1.030)
pH: 5 (ref 5.0–8.0)

## 2022-06-07 LAB — CBC WITH DIFFERENTIAL/PLATELET
Abs Immature Granulocytes: 0.06 10*3/uL (ref 0.00–0.07)
Abs Immature Granulocytes: 0.03 10*3/uL (ref 0.00–0.07)
Basophils Absolute: 0.1 10*3/uL (ref 0.0–0.1)
Basophils Absolute: 0 10*3/uL (ref 0.0–0.1)
Basophils Relative: 0 %
Basophils Relative: 0 %
Eosinophils Absolute: 0.2 10*3/uL (ref 0.0–0.5)
Eosinophils Absolute: 0.2 10*3/uL (ref 0.0–0.5)
Eosinophils Relative: 1 %
Eosinophils Relative: 1 %
HCT: 38.4 % (ref 36.0–46.0)
HCT: 36.3 % (ref 36.0–46.0)
Hemoglobin: 12.9 g/dL (ref 12.0–15.0)
Hemoglobin: 12 g/dL (ref 12.0–15.0)
Immature Granulocytes: 1 %
Immature Granulocytes: 0 %
Lymphocytes Relative: 17 %
Lymphocytes Relative: 15 %
Lymphs Abs: 2.2 10*3/uL (ref 0.7–4.0)
Lymphs Abs: 1.6 10*3/uL (ref 0.7–4.0)
MCH: 30.3 pg (ref 26.0–34.0)
MCH: 30.4 pg (ref 26.0–34.0)
MCHC: 33.6 g/dL (ref 30.0–36.0)
MCHC: 33.1 g/dL (ref 30.0–36.0)
MCV: 90.1 fL (ref 80.0–100.0)
MCV: 91.9 fL (ref 80.0–100.0)
Monocytes Absolute: 0.6 10*3/uL (ref 0.1–1.0)
Monocytes Absolute: 0.7 10*3/uL (ref 0.1–1.0)
Monocytes Relative: 5 %
Monocytes Relative: 6 %
Neutro Abs: 10 10*3/uL — ABNORMAL HIGH (ref 1.7–7.7)
Neutro Abs: 8.4 10*3/uL — ABNORMAL HIGH (ref 1.7–7.7)
Neutrophils Relative %: 76 %
Neutrophils Relative %: 78 %
Platelets: 292 10*3/uL (ref 150–400)
Platelets: 249 10*3/uL (ref 150–400)
RBC: 4.26 MIL/uL (ref 3.87–5.11)
RBC: 3.95 MIL/uL (ref 3.87–5.11)
RDW: 13.3 % (ref 11.5–15.5)
RDW: 13.5 % (ref 11.5–15.5)
WBC: 13.1 10*3/uL — ABNORMAL HIGH (ref 4.0–10.5)
WBC: 10.9 10*3/uL — ABNORMAL HIGH (ref 4.0–10.5)
nRBC: 0 % (ref 0.0–0.2)
nRBC: 0 % (ref 0.0–0.2)

## 2022-06-07 LAB — BASIC METABOLIC PANEL
Anion gap: 6 (ref 5–15)
Anion gap: 6 (ref 5–15)
BUN: 15 mg/dL (ref 6–20)
BUN: 20 mg/dL (ref 6–20)
CO2: 22 mmol/L (ref 22–32)
CO2: 23 mmol/L (ref 22–32)
Calcium: 8.4 mg/dL — ABNORMAL LOW (ref 8.9–10.3)
Calcium: 8.3 mg/dL — ABNORMAL LOW (ref 8.9–10.3)
Chloride: 107 mmol/L (ref 98–111)
Chloride: 107 mmol/L (ref 98–111)
Creatinine, Ser: 0.74 mg/dL (ref 0.44–1.00)
Creatinine, Ser: 1.17 mg/dL — ABNORMAL HIGH (ref 0.44–1.00)
GFR, Estimated: >60 mL/min (ref >60)
GFR, Estimated: >60 mL/min (ref >60)
Glucose, Bld: 106 mg/dL — ABNORMAL HIGH (ref 70–99)
Glucose, Bld: 98 mg/dL (ref 70–99)
Potassium: 3.7 mmol/L (ref 3.5–5.1)
Potassium: 3.9 mmol/L (ref 3.5–5.1)
Sodium: 135 mmol/L (ref 135–145)
Sodium: 136 mmol/L (ref 135–145)

## 2022-06-07 LAB — I-STAT BETA HCG BLOOD, ED (MC, WL, AP ONLY): I-stat hCG, quantitative: <5 m[IU]/mL (ref <5)

## 2022-06-07 MED ORDER — HYDROMORPHONE HCL 1 MG/ML IJ SOLN
1.0000 mg | Freq: Once | INTRAMUSCULAR | Status: AC
  Administered 2022-06-07: 1 mg via INTRAVENOUS
  Filled 2022-06-07: qty 1

## 2022-06-07 MED ORDER — KETOROLAC TROMETHAMINE 15 MG/ML IJ SOLN
15.0000 mg | Freq: Once | INTRAMUSCULAR | Status: AC
  Administered 2022-06-07: 15 mg via INTRAVENOUS
  Filled 2022-06-07: qty 1

## 2022-06-07 MED ORDER — ONDANSETRON HCL 4 MG/2ML IJ SOLN
4.0000 mg | Freq: Once | INTRAMUSCULAR | Status: AC
  Administered 2022-06-07: 4 mg via INTRAVENOUS
  Filled 2022-06-07: qty 2

## 2022-06-07 MED ORDER — SODIUM CHLORIDE 0.9 % IV SOLN
1.5000 mg/kg | Freq: Once | INTRAVENOUS | Status: AC
  Administered 2022-06-07: 174 mg via INTRAVENOUS
  Filled 2022-06-07: qty 8.7

## 2022-06-07 MED ORDER — MORPHINE SULFATE (PF) 4 MG/ML IV SOLN
4.0000 mg | Freq: Once | INTRAVENOUS | Status: AC
  Administered 2022-06-07: 4 mg via INTRAVENOUS
  Filled 2022-06-07: qty 1

## 2022-06-07 MED ORDER — KETOROLAC TROMETHAMINE 30 MG/ML IJ SOLN
30.0000 mg | Freq: Once | INTRAMUSCULAR | Status: AC
  Administered 2022-06-07: 30 mg via INTRAVENOUS
  Filled 2022-06-07: qty 1

## 2022-06-07 MED ORDER — TAMSULOSIN HCL 0.4 MG PO CAPS: 0.4000 mg | ORAL_CAPSULE | Freq: Every day | ORAL | 0 refills | Status: DC

## 2022-06-07 MED ORDER — OXYCODONE-ACETAMINOPHEN 5-325 MG PO TABS: 1.0000 | ORAL_TABLET | ORAL | 0 refills | Status: DC | PRN

## 2022-06-07 MED ORDER — ONDANSETRON 4 MG PO TBDP: 4.0000 mg | ORAL_TABLET | Freq: Three times a day (TID) | ORAL | 0 refills | Status: DC | PRN

## 2022-06-07 NOTE — ED Triage Notes (Signed)
Pt. Arrives c/o flank pain that radiates to the lower abdomen x3 hrs. Pt. Endorse nausea and vomiting. She states that it feels like a kidney stone. Pt. Is unable sit and is postured on her knees in the bed.

## 2022-06-07 NOTE — Discharge Instructions (Addendum)
Take the prescribed medication as directed.  Make sure to stay hydrated. Follow-up with urology-- can call office to arrange this. Return to the ED for new or worsening symptoms--uncontrolled pain, high fever, vomiting, inability to urinate, etc.

## 2022-06-07 NOTE — ED Notes (Signed)
Pt ambulated to her daughters room which is adjacent  to her own. Pt IV removed. Pt verbalized understanding  of discharge instructions

## 2022-06-07 NOTE — ED Provider Notes (Signed)
[facility] [facility] EMERGENCY DEPARTMENT AT [facility] Provider Note   CSN: [id number] Arrival date & time: 06/07/22  2221     History {Add pertinent medical, surgical, social history, OB history to HPI:1} Chief Complaint  Patient presents with   Flank Pain    Kahlie EURA MICCO is a 42 y.o. female.  The history is provided by the patient and medical records.  Flank Pain  Dasie DI BAGOT is a 42 y.o. female who presents to the Emergency Department complaining of *** Kidney stones Took percocet - worked for a while and then stopped around 8p.  Left flank to llq and bladder pain.  Told to come back She is drinking a lot but only urinates  a little Hard to pee   No fever, has vomiting.   Depression, anxiety, adhd, asthma, ibs, insomnia, hypersomnia, cyclothemia.  Migraine, gerd     Home Medications Prior to Admission medications   Medication Sig Start Date End Date Taking? Authorizing Provider  acetaminophen (TYLENOL) 500 MG tablet 1 tablet as needed    [provider]  acyclovir (ZOVIRAX) 400 MG tablet Take 400 mg by mouth as needed.    [provider]  albuterol (PROVENTIL HFA;VENTOLIN HFA) 108 (90 BASE) MCG/ACT inhaler Inhale 2 puffs into the lungs every 6 (six) hours as needed. For shortness of breath.    [provider]  Armodafinil 150 MG tablet Take 150 mg by mouth every morning. 10/15/21   [provider]  busPIRone (BUSPAR) 10 MG tablet Take by mouth. 08/11/20   [provider]  cefdinir (OMNICEF) 300 MG capsule Take 300 mg by mouth 2 (two) times daily. 10/21/21   [provider]  Crisaborole (EUCRISA) 2 % OINT Apply to affected area twice daily as needed 10/31/21   Ferol Luz, MD  famotidine (PEPCID) 40 MG tablet Take 40 mg by mouth at bedtime. 09/07/21   [provider]  fluticasone (FLOVENT HFA) 110 MCG/ACT inhaler Take 1-2 puffs twice a day with spacer and rinse mouth afterwards. 11/28/19    Ellamae Sia, DO  fluticasone furoate-vilanterol (BREO ELLIPTA) 100-25 MCG/ACT AEPB Inhale 1 puff into the lungs daily. 01/22/22   Ferol Luz, MD  gabapentin (NEURONTIN) 100 MG capsule Take by mouth.    [provider]  hydrOXYzine (ATARAX) 25 MG tablet Take by mouth.    [provider]  levonorgestrel (MIRENA) 20 MCG/DAY IUD See admin instructions. 05/27/12   [provider]  montelukast (SINGULAIR) 10 MG tablet Take 10 mg by mouth daily. 08/12/21   [provider]  Multiple Vitamins-Minerals (MULTIVITAMIN WOMEN PO) Take 1 tablet by mouth daily.    [provider]  omeprazole (PRILOSEC) 40 MG capsule Take 40 mg by mouth 2 (two) times daily. 07/15/21   [provider]  ondansetron (ZOFRAN) 4 MG tablet Take 4 mg by mouth as needed. 12/12/20   [provider]  ondansetron (ZOFRAN-ODT) 4 MG disintegrating tablet Take 1 tablet (4 mg total) by mouth every 8 (eight) hours as needed for nausea. 06/07/22   Garlon Hatchet, PA-C  oxyCODONE-acetaminophen (PERCOCET) 5-325 MG tablet Take 1 tablet by mouth every 4 (four) hours as needed. 06/07/22   Garlon Hatchet, PA-C  ramelteon (ROZEREM) 8 MG tablet Take 8 mg by mouth at bedtime. 10/23/21   [provider]  tamsulosin (FLOMAX) 0.4 MG CAPS capsule Take 1 capsule (0.4 mg total) by mouth daily after supper. 06/07/22   Garlon Hatchet, PA-C  triamcinolone ointment (  KENALOG) 0.1 % Apply 1 application topically 2 (two) times daily as needed. Do not use on the face, neck, armpits or groin area. Do not use more than 3 weeks in a row. 10/27/19   Ellamae Sia, DO      Allergies    Aspirin, Nsaids, Azithromycin, Depo-provera [medroxyprogesterone], Nuvaring [etonogestrel-ethinyl estradiol], Ortho-cyclen [norgestimate-eth estradiol], and Penicillins    Review of Systems   Review of Systems  Genitourinary:  Positive for flank pain.  All other systems reviewed and are negative.   Physical  Exam Updated Vital Signs BP 119/69   Pulse 64   Temp 98.1 F (36.7 C) (Oral)   Resp 18   Ht 5\' 4"  (1.626 m)   Wt 115.7 kg   SpO2 100%   BMI 43.77 kg/m  Physical Exam Vitals and nursing note reviewed.  Constitutional:      Appearance: She is well-developed.  HENT:     Head: Normocephalic and atraumatic.  Cardiovascular:     Rate and Rhythm: Normal rate and regular rhythm.  Pulmonary:     Effort: Pulmonary effort is normal. No respiratory distress.  Abdominal:     Palpations: Abdomen is soft.     Tenderness: There is no guarding or rebound.     Comments: Mild abdominal tenderness  Musculoskeletal:        General: No tenderness.  Skin:    General: Skin is warm and dry.  Neurological:     Mental Status: She is alert and oriented to person, place, and time.  Psychiatric:        Behavior: Behavior normal.     ED Results / Procedures / Treatments   Labs (all labs ordered are listed, but only abnormal results are displayed) Labs Reviewed - No data to display  EKG None  Radiology CT Renal Stone Study  Result Date: 06/07/2022 CLINICAL DATA:  Flank pain with kidney stone suspected. EXAM: CT ABDOMEN AND PELVIS WITHOUT CONTRAST TECHNIQUE: Multidetector CT imaging of the abdomen and pelvis was performed following the standard protocol without IV contrast. RADIATION DOSE REDUCTION: This exam was performed according to the departmental dose-optimization program which includes automated exposure control, adjustment of the mA and/or kV according to patient size and/or use of iterative reconstruction technique. COMPARISON:  CTs with contrast 05/09/2021 and 06/12/2015 FINDINGS: Lower chest: Small hiatal hernia. Lung bases are clear. The cardiac size is normal. Hepatobiliary: The liver is 18 cm length, mildly steatotic. No focal lesion is seen without contrast. Pancreas: There is mild fatty infiltration.  No focal abnormality. Spleen: Slightly prominent, 13.5 cm AP. No focal abnormality  without contrast. Adrenals/Urinary Tract: There is no adrenal mass. On the left, there is mild hydronephrosis due to a 3 mm proximal ureteral stone at the level of L3-4. There is a 1 mm caliceal stone in the inferior pole of the right kidney. No other intrarenal stones are seen on either side. The bilateral ureters are otherwise clear. The bladder is unremarkable for the degree of distention. 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 The unopacified small bowel is unremarkable. An appendix is not seen in this patient. There is moderate to severe retained stool in the cecum and ascending colon, proximal transverse segment. Scattered left-sided diverticula without diverticulitis. Vascular/Lymphatic: No significant vascular findings are present. No enlarged abdominal or pelvic lymph nodes. Reproductive: IUD is in place in expected position in the uterine cavity. The uterus is otherwise unremarkable. The ovaries are not enlarged. Other: Multiple pelvic phleboliths. Umbilical  fat hernia. There is no incarcerated hernia no free air, free hemorrhage or free fluid. Musculoskeletal: No acute or significant osseous findings. IMPRESSION: 1. 3 mm proximal left ureteral stone with mild obstructive uropathy. 2. Nonobstructive 1 mm solitary stone on the right. 3. Constipation and diverticulosis. 4. Small hiatal hernia.  Umbilical fat hernia. 5. Mild hepatic steatosis. 6. Mild splenomegaly. Electronically Signed   By: Almira Bar M.D.   On: 06/07/2022 05:00    Procedures Procedures  {Document cardiac monitor, telemetry assessment procedure when appropriate:1}  Medications Ordered in ED Medications - No data to display  ED Course/ Medical Decision Making/ A&P   {   Click here for ABCD2, HEART and other calculatorsREFRESH Note before signing :1}                          Medical Decision Making  ***  {Document critical care time when appropriate:1} {Document review of labs  and clinical decision tools ie heart score, Chads2Vasc2 etc:1}  {Document your independent review of radiology images, and any outside records:1} {Document your discussion with family members, caretakers, and with consultants:1} {Document social determinants of health affecting pt's care:1} {Document your decision making why or why not admission, treatments were needed:1} Final Clinical Impression(s) / ED Diagnoses Final diagnoses:  None    Rx / DC Orders ED Discharge Orders     None

## 2022-06-07 NOTE — ED Triage Notes (Signed)
Patient coming to ED for evaluation of L sided flank pain.  Was seen here last night for same and dx with kidney stone.  States oral pain medication is not working.  Having nausea and vomiting.

## 2022-06-07 NOTE — ED Provider Notes (Signed)
[place] EMERGENCY DEPARTMENT AT [facility] Provider Note   CSN: [id number] Arrival date & time: 06/07/22  5784     History  Chief Complaint  Patient presents with   Flank Pain    Claudia Walsh is a 42 y.o. female.  The history is provided by the patient and medical records.  Flank Pain   42 y.o. F with hx of kidney stones, GERD, asthma, anxiety, ADD, depression, presenting to the ED for left flank pain.  She has had some nausea/vomiting as well.  No fevers or urinary symptoms.  Difficulty sitting still due to discomfort.  Reports hx of kidney stones during pregnancy and this feels similar.  No intervention PTA.  Home Medications Prior to Admission medications   Medication Sig Start Date End Date Taking? Authorizing Provider  acetaminophen (TYLENOL) 500 MG tablet 1 tablet as needed    [provider]  acyclovir (ZOVIRAX) 400 MG tablet Take 400 mg by mouth as needed.    [provider]  albuterol (PROVENTIL HFA;VENTOLIN HFA) 108 (90 BASE) MCG/ACT inhaler Inhale 2 puffs into the lungs every 6 (six) hours as needed. For shortness of breath.    [provider]  Armodafinil 150 MG tablet Take 150 mg by mouth every morning. 10/15/21   [provider]  busPIRone (BUSPAR) 10 MG tablet Take by mouth. 08/11/20   [provider]  cefdinir (OMNICEF) 300 MG capsule Take 300 mg by mouth 2 (two) times daily. 10/21/21   [provider]  Crisaborole (EUCRISA) 2 % OINT Apply to affected area twice daily as needed 10/31/21   Ferol Luz, MD  famotidine (PEPCID) 40 MG tablet Take 40 mg by mouth at bedtime. 09/07/21   [provider]  fluticasone (FLOVENT HFA) 110 MCG/ACT inhaler Take 1-2 puffs twice a day with spacer and rinse mouth afterwards. 11/28/19   Ellamae Sia, DO  fluticasone furoate-vilanterol (BREO ELLIPTA) 100-25 MCG/ACT AEPB Inhale 1 puff into the lungs daily. 01/22/22   Ferol Luz, MD  gabapentin  (NEURONTIN) 100 MG capsule Take by mouth.    [provider]  hydrOXYzine (ATARAX) 25 MG tablet Take by mouth.    [provider]  levonorgestrel (MIRENA) 20 MCG/DAY IUD See admin instructions. 05/27/12   [provider]  montelukast (SINGULAIR) 10 MG tablet Take 10 mg by mouth daily. 08/12/21   [provider]  Multiple Vitamins-Minerals (MULTIVITAMIN WOMEN PO) Take 1 tablet by mouth daily.    [provider]  omeprazole (PRILOSEC) 40 MG capsule Take 40 mg by mouth 2 (two) times daily. 07/15/21   [provider]  ondansetron (ZOFRAN) 4 MG tablet Take 4 mg by mouth as needed. 12/12/20   [provider]  ramelteon (ROZEREM) 8 MG tablet Take 8 mg by mouth at bedtime. 10/23/21   [provider]  triamcinolone ointment (KENALOG) 0.1 % Apply 1 application topically 2 (two) times daily as needed. Do not use on the face, neck, armpits or groin area. Do not use more than 3 weeks in a row. 10/27/19   Ellamae Sia, DO      Allergies    Aspirin, Nsaids, Azithromycin, Depo-provera [medroxyprogesterone], Nuvaring [etonogestrel-ethinyl estradiol], Ortho-cyclen [norgestimate-eth estradiol], and Penicillins    Review of Systems   Review of Systems  Genitourinary:  Positive for flank pain.  All other systems reviewed and are negative.   Physical Exam Updated Vital Signs BP 125/76 (BP Location: Right Arm)   Pulse 62   Temp  98.5 F (36.9 C) (Oral)   Resp 18   SpO2 98%   Physical Exam Vitals and nursing note reviewed.  Constitutional:      Appearance: She is well-developed.     Comments: Sitting on all 4's on stretcher, appears uncomfortable  HENT:     Head: Normocephalic and atraumatic.  Eyes:     Conjunctiva/sclera: Conjunctivae normal.     Pupils: Pupils are equal, round, and reactive to light.  Cardiovascular:     Rate and Rhythm: Normal rate and regular rhythm.     Heart sounds: Normal heart sounds.  Pulmonary:     Effort:  Pulmonary effort is normal.     Breath sounds: Normal breath sounds.  Abdominal:     General: Bowel sounds are normal.     Palpations: Abdomen is soft.  Musculoskeletal:        General: Normal range of motion.     Cervical back: Normal range of motion.  Skin:    General: Skin is warm and dry.  Neurological:     Mental Status: She is alert and oriented to person, place, and time.     ED Results / Procedures / Treatments   Labs (all labs ordered are listed, but only abnormal results are displayed) Labs Reviewed  CBC WITH DIFFERENTIAL/PLATELET - Abnormal; Notable for the following components:      Result Value   WBC 13.1 (*)    Neutro Abs 10.0 (*)    All other components within normal limits  BASIC METABOLIC PANEL - Abnormal; Notable for the following components:   Glucose, Bld 106 (*)    Calcium 8.4 (*)    All other components within normal limits  URINALYSIS, W/ REFLEX TO CULTURE (INFECTION SUSPECTED) - Abnormal; Notable for the following components:   APPearance HAZY (*)    Hgb urine dipstick LARGE (*)    All other components within normal limits  I-STAT BETA HCG BLOOD, ED ([facility], [facility], [facility] ONLY)    EKG None  Radiology CT Renal Stone Study  Result Date: 06/07/2022 CLINICAL DATA:  Flank pain with kidney stone suspected. EXAM: CT ABDOMEN AND PELVIS WITHOUT CONTRAST TECHNIQUE: Multidetector CT imaging of the abdomen and pelvis was performed following the standard protocol without IV contrast. RADIATION DOSE REDUCTION: This exam was performed according to the departmental dose-optimization program which includes automated exposure control, adjustment of the mA and/or kV according to patient size and/or use of iterative reconstruction technique. COMPARISON:  CTs with contrast 05/09/2021 and 06/12/2015 FINDINGS: Lower chest: Small hiatal hernia. Lung bases are clear. The cardiac size is normal. Hepatobiliary: The liver is 18 cm length, mildly steatotic. No focal lesion is seen without  contrast. Pancreas: There is mild fatty infiltration.  No focal abnormality. Spleen: Slightly prominent, 13.5 cm AP. No focal abnormality without contrast. Adrenals/Urinary Tract: There is no adrenal mass. On the left, there is mild hydronephrosis due to a 3 mm proximal ureteral stone at the level of L3-4. There is a 1 mm caliceal stone in the inferior pole of the right kidney. No other intrarenal stones are seen on either side. The bilateral ureters are otherwise clear. The bladder is unremarkable for the degree of distention. 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 The unopacified small bowel is unremarkable. An appendix is not seen in this patient. There is moderate to severe retained stool in the cecum and ascending colon, proximal transverse segment. Scattered left-sided diverticula without diverticulitis. Vascular/Lymphatic: No significant vascular findings are present.  No enlarged abdominal or pelvic lymph nodes. Reproductive: IUD is in place in expected position in the uterine cavity. The uterus is otherwise unremarkable. The ovaries are not enlarged. Other: Multiple pelvic phleboliths. Umbilical fat hernia. There is no incarcerated hernia no free air, free hemorrhage or free fluid. Musculoskeletal: No acute or significant osseous findings. IMPRESSION: 1. 3 mm proximal left ureteral stone with mild obstructive uropathy. 2. Nonobstructive 1 mm solitary stone on the right. 3. Constipation and diverticulosis. 4. Small hiatal hernia.  Umbilical fat hernia. 5. Mild hepatic steatosis. 6. Mild splenomegaly. Electronically Signed   By: Almira Bar M.D.   On: 06/07/2022 05:00    Procedures Procedures    Medications Ordered in ED Medications  ketorolac (TORADOL) 30 MG/ML injection 30 mg (has no administration in time range)  HYDROmorphone (DILAUDID) injection 1 mg (1 mg Intravenous Given 06/07/22 0303)  ondansetron (ZOFRAN) injection 4 mg (4 mg Intravenous Given  06/07/22 0303)  morphine (PF) 4 MG/ML injection 4 mg (4 mg Intravenous Given 06/07/22 0343)  HYDROmorphone (DILAUDID) injection 1 mg (1 mg Intravenous Given 06/07/22 0506)  lidocaine (XYLOCAINE) 174 mg in sodium chloride 0.9 % 100 mL IVPB (0 mg Intravenous Stopped 06/07/22 0549)    ED Course/ Medical Decision Making/ A&P                             Medical Decision Making Amount and/or Complexity of Data Reviewed Labs: ordered. Radiology: ordered and independent interpretation performed. ECG/medicine tests: ordered and independent interpretation performed.  Risk Prescription drug management.   42 year old female here with left flank pain that began about 3 hours ago.  She has associated nausea and vomiting along with difficulty sitting still.  History of kidney stones and reports this feels similar.  She is afebrile and nontoxic.  She is sitting on all fours on the stretcher and appears uncomfortable.  Given IV Dilaudid and Zofran.  Plan for labs, UA, stone study.  Labs as above--no leukocytosis or electrolyte derangement.  UA with hematuria but no acute signs of infection.  CT with 3 mm proximal left ureteral calculus.  Patient still having quite a bit of pain after Dilaudid and morphine.  Will give additional dose of Dilaudid and lidocaine infusion.  5:58 AM After multiple rounds of medication, patient finallty getting some relief.  She does have documented hx of NSAID allergy, however has tolerated toradol in the past.  She is willing to have a dose today.  Will discharge home with pain/nausea meds and flomax.  Given urology follow-up.  Can return here for any new/acute changes.  Final Clinical Impression(s) / ED Diagnoses Final diagnoses:  Left ureteral stone    Rx / DC Orders ED Discharge Orders          Ordered    oxyCODONE-acetaminophen (PERCOCET) 5-325 MG tablet  Every 4 hours PRN        06/07/22 0559    ondansetron (ZOFRAN-ODT) 4 MG disintegrating tablet  Every 8 hours PRN         06/07/22 0559    tamsulosin (FLOMAX) 0.4 MG CAPS capsule  Daily after supper        06/07/22 0559              Garlon Hatchet, PA-C 06/07/22 1610    Sabas Sous, MD 06/08/22 [phone]

## 2022-06-08 ENCOUNTER — Emergency Department (HOSPITAL_COMMUNITY): Payer: BC Managed Care – PPO

## 2022-06-08 DIAGNOSIS — R109 Unspecified abdominal pain: Secondary | ICD-10-CM | POA: Diagnosis not present

## 2022-06-08 LAB — URINALYSIS, ROUTINE W REFLEX MICROSCOPIC
Bilirubin Urine: NEGATIVE
Glucose, UA: NEGATIVE mg/dL
Hgb urine dipstick: NEGATIVE
Ketones, ur: NEGATIVE mg/dL
Leukocytes,Ua: NEGATIVE
Nitrite: NEGATIVE
Protein, ur: NEGATIVE mg/dL
Specific Gravity, Urine: 1.033 — ABNORMAL HIGH (ref 1.005–1.030)
pH: 5 (ref 5.0–8.0)

## 2022-06-08 MED ORDER — KETOROLAC TROMETHAMINE 15 MG/ML IJ SOLN
15.0000 mg | Freq: Once | INTRAMUSCULAR | Status: AC
  Administered 2022-06-08: 15 mg via INTRAVENOUS
  Filled 2022-06-08: qty 1

## 2022-06-08 MED ORDER — OXYCODONE-ACETAMINOPHEN 5-325 MG PO TABS
1.0000 | ORAL_TABLET | Freq: Once | ORAL | Status: AC
  Administered 2022-06-08: 1 via ORAL
  Filled 2022-06-08: qty 1

## 2022-06-08 MED ORDER — DIPHENHYDRAMINE HCL 25 MG PO CAPS
25.0000 mg | ORAL_CAPSULE | Freq: Once | ORAL | Status: AC
  Administered 2022-06-08: 25 mg via ORAL
  Filled 2022-06-08: qty 1

## 2022-06-08 MED ORDER — SODIUM CHLORIDE 0.9 % IV BOLUS
500.0000 mL | Freq: Once | INTRAVENOUS | Status: AC
  Administered 2022-06-08: 500 mL via INTRAVENOUS

## 2022-06-10 ENCOUNTER — Encounter (HOSPITAL_COMMUNITY)
Admission: AD | Disposition: A | Discharge status: Home / Self Care | Payer: Self-pay | Source: Ambulatory Visit | Attending: Urology

## 2022-06-10 ENCOUNTER — Inpatient Hospital Stay (HOSPITAL_COMMUNITY): Payer: BC Managed Care – PPO

## 2022-06-10 ENCOUNTER — Inpatient Hospital Stay (HOSPITAL_COMMUNITY): Payer: BC Managed Care – PPO | Admitting: Certified Registered"

## 2022-06-10 ENCOUNTER — Ambulatory Visit (HOSPITAL_COMMUNITY)
Admission: AD | Admit: 2022-06-10 | Discharge: 2022-06-10 | Disposition: A | Discharge status: Home / Self Care | Payer: BC Managed Care – PPO | Source: Ambulatory Visit | Attending: Urology | Admitting: Urology

## 2022-06-10 ENCOUNTER — Encounter (HOSPITAL_COMMUNITY): Payer: Self-pay | Admitting: Urology

## 2022-06-10 DIAGNOSIS — Z6841 Body Mass Index (BMI) 40.0 and over, adult: Secondary | ICD-10-CM | POA: Diagnosis not present

## 2022-06-10 DIAGNOSIS — F419 Anxiety disorder, unspecified: Secondary | ICD-10-CM | POA: Diagnosis not present

## 2022-06-10 DIAGNOSIS — K219 Gastro-esophageal reflux disease without esophagitis: Secondary | ICD-10-CM | POA: Diagnosis not present

## 2022-06-10 DIAGNOSIS — E669 Obesity, unspecified: Secondary | ICD-10-CM | POA: Diagnosis not present

## 2022-06-10 DIAGNOSIS — G4730 Sleep apnea, unspecified: Secondary | ICD-10-CM | POA: Insufficient documentation

## 2022-06-10 DIAGNOSIS — F32A Depression, unspecified: Secondary | ICD-10-CM | POA: Insufficient documentation

## 2022-06-10 DIAGNOSIS — N200 Calculus of kidney: Secondary | ICD-10-CM

## 2022-06-10 DIAGNOSIS — J45909 Unspecified asthma, uncomplicated: Secondary | ICD-10-CM | POA: Diagnosis not present

## 2022-06-10 DIAGNOSIS — N201 Calculus of ureter: Secondary | ICD-10-CM | POA: Insufficient documentation

## 2022-06-10 HISTORY — PX: CYSTOSCOPY WITH RETROGRADE PYELOGRAM, URETEROSCOPY AND STENT PLACEMENT: SHX5789

## 2022-06-10 LAB — POCT PREGNANCY, URINE: Preg Test, Ur: NEGATIVE

## 2022-06-10 SURGERY — CYSTOURETEROSCOPY, WITH RETROGRADE PYELOGRAM AND STENT INSERTION
Anesthesia: General | Laterality: Left

## 2022-06-10 MED ORDER — MIDAZOLAM HCL 5 MG/5ML IJ SOLN
INTRAMUSCULAR | Status: DC | PRN
  Administered 2022-06-10: 2 mg via INTRAVENOUS

## 2022-06-10 MED ORDER — FENTANYL CITRATE PF 50 MCG/ML IJ SOSY: 25.0000 ug | PREFILLED_SYRINGE | INTRAMUSCULAR | Status: DC | PRN

## 2022-06-10 MED ORDER — LACTATED RINGERS IV SOLN: INTRAVENOUS | Status: DC

## 2022-06-10 MED ORDER — CIPROFLOXACIN IN D5W 400 MG/200ML IV SOLN
INTRAVENOUS | Status: DC | PRN
  Administered 2022-06-10: 400 mg via INTRAVENOUS

## 2022-06-10 MED ORDER — LACTATED RINGERS IV SOLN: INTRAVENOUS | Status: DC | PRN

## 2022-06-10 MED ORDER — ONDANSETRON HCL 4 MG/2ML IJ SOLN
INTRAMUSCULAR | Status: DC | PRN
  Administered 2022-06-10: 4 mg via INTRAVENOUS

## 2022-06-10 MED ORDER — ONDANSETRON HCL 4 MG/2ML IJ SOLN: 4.0000 mg | Freq: Four times a day (QID) | INTRAMUSCULAR | Status: DC | PRN

## 2022-06-10 MED ORDER — FENTANYL CITRATE (PF) 100 MCG/2ML IJ SOLN
INTRAMUSCULAR | Status: DC | PRN
  Administered 2022-06-10 (×2): 50 ug via INTRAVENOUS

## 2022-06-10 MED ORDER — CHLORHEXIDINE GLUCONATE 0.12 % MT SOLN
15.0000 mL | Freq: Once | OROMUCOSAL | Status: AC
  Administered 2022-06-10: 15 mL via OROMUCOSAL

## 2022-06-10 MED ORDER — FENTANYL CITRATE (PF) 100 MCG/2ML IJ SOLN
INTRAMUSCULAR | Status: AC
  Filled 2022-06-10: qty 2

## 2022-06-10 MED ORDER — SUGAMMADEX SODIUM 200 MG/2ML IV SOLN
INTRAVENOUS | Status: DC | PRN
  Administered 2022-06-10: 200 mg via INTRAVENOUS

## 2022-06-10 MED ORDER — PROPOFOL 10 MG/ML IV BOLUS
INTRAVENOUS | Status: AC
  Filled 2022-06-10: qty 20

## 2022-06-10 MED ORDER — LIDOCAINE HCL (PF) 2 % IJ SOLN
INTRAMUSCULAR | Status: AC
  Filled 2022-06-10: qty 5

## 2022-06-10 MED ORDER — DEXAMETHASONE SODIUM PHOSPHATE 10 MG/ML IJ SOLN
INTRAMUSCULAR | Status: DC | PRN
  Administered 2022-06-10: 10 mg via INTRAVENOUS

## 2022-06-10 MED ORDER — OXYCODONE HCL 5 MG/5ML PO SOLN: 5.0000 mg | Freq: Once | ORAL | Status: DC | PRN

## 2022-06-10 MED ORDER — ROCURONIUM BROMIDE 10 MG/ML (PF) SYRINGE
PREFILLED_SYRINGE | INTRAVENOUS | Status: DC | PRN
  Administered 2022-06-10: 50 mg via INTRAVENOUS

## 2022-06-10 MED ORDER — OXYCODONE HCL 5 MG PO TABS: 5.0000 mg | ORAL_TABLET | Freq: Once | ORAL | Status: DC | PRN

## 2022-06-10 MED ORDER — CIPROFLOXACIN IN D5W 400 MG/200ML IV SOLN
INTRAVENOUS | Status: AC
  Filled 2022-06-10: qty 200

## 2022-06-10 MED ORDER — PROPOFOL 10 MG/ML IV BOLUS
INTRAVENOUS | Status: DC | PRN
  Administered 2022-06-10: 170 mg via INTRAVENOUS

## 2022-06-10 MED ORDER — MIDAZOLAM HCL 2 MG/2ML IJ SOLN
INTRAMUSCULAR | Status: AC
  Filled 2022-06-10: qty 2

## 2022-06-10 MED ORDER — LIDOCAINE 2% (20 MG/ML) 5 ML SYRINGE
INTRAMUSCULAR | Status: DC | PRN
  Administered 2022-06-10: 60 mg via INTRAVENOUS

## 2022-06-10 SURGICAL SUPPLY — 21 items
BAG URO CATCHER STRL LF (MISCELLANEOUS) ×1 IMPLANT
BASKET ZERO TIP NITINOL 2.4FR (BASKET) IMPLANT
CATH URETL OPEN 5X70 (CATHETERS) ×1 IMPLANT
CLOTH BEACON ORANGE TIMEOUT ST (SAFETY) ×1 IMPLANT
EXTRACTOR STONE 1.7FRX115CM (UROLOGICAL SUPPLIES) IMPLANT
GLOVE SURG LX STRL 7.5 STRW (GLOVE) ×1 IMPLANT
GOWN STRL REUS W/ TWL XL LVL3 (GOWN DISPOSABLE) ×1 IMPLANT
GOWN STRL REUS W/TWL XL LVL3 (GOWN DISPOSABLE) ×1
GUIDEWIRE ANG ZIPWIRE 038X150 (WIRE) IMPLANT
GUIDEWIRE STR DUAL SENSOR (WIRE) ×1 IMPLANT
KIT TURNOVER KIT A (KITS) IMPLANT
LASER FIB FLEXIVA PULSE ID 365 (Laser) IMPLANT
MANIFOLD NEPTUNE II (INSTRUMENTS) ×1 IMPLANT
PACK CYSTO (CUSTOM PROCEDURE TRAY) ×1 IMPLANT
SHEATH NAVIGATOR HD 12/14X28 (SHEATH) IMPLANT
SHEATH NAVIGATOR HD 12/14X36 (SHEATH) IMPLANT
STENT URET 6FRX24 CONTOUR (STENTS) IMPLANT
TRACTIP FLEXIVA PULS ID 200XHI (Laser) IMPLANT
TRACTIP FLEXIVA PULSE ID 200 (Laser)
TUBING CONNECTING 10 (TUBING) ×1 IMPLANT
TUBING UROLOGY SET (TUBING) ×1 IMPLANT

## 2022-06-10 NOTE — Op Note (Signed)
Preoperative diagnosis:  Left ureteral stone   Postoperative diagnosis:  same   Procedure:  Cystoscopy left ureteral stent placement left retrograde pyelography with interpretation   Surgeon: Crist Fat, MD  Anesthesia: General  Complications: None  Intraoperative findings:  left retrograde pyelography demonstrated a filling defect within the left ureter consistent with the patient's known calculus without other abnormalities.  EBL: Minimal  Specimens: None  Indication: Claudia Walsh is a 42 y.o. patient with poor pain control with a left sided ureteral stone. After reviewing the management options for treatment, he elected to proceed with the above surgical procedure(s). We have discussed the potential benefits and risks of the procedure, side effects of the proposed treatment, the likelihood of the patient achieving the goals of the procedure, and any potential problems that might occur during the procedure or recuperation. Informed consent has been obtained.  Description of procedure:  The patient was taken to the operating room and general anesthesia was induced.  The patient was placed in the dorsal lithotomy position, prepped and draped in the usual sterile fashion, and preoperative antibiotics were administered. A preoperative time-out was performed.   Cystourethroscopy was performed.  The patient's urethra was examined and was normal. The bladder was then systematically examined in its entirety. There was no evidence for any bladder tumors, stones, or other mucosal pathology.    Attention then turned to the left ureteral orifice and a ureteral catheter was used to intubate the ureteral orifice.  Omnipaque contrast was injected through the ureteral catheter and a retrograde pyelogram was performed with findings as dictated above.  A 0.38 sensor guidewire was then advanced up the left ureter into the renal pelvis under fluoroscopic guidance.  The wire was then  backloaded through the cystoscope and a ureteral stent was advance over the wire using Seldinger technique.  The stent was positioned appropriately under fluoroscopic and cystoscopic guidance.  The wire was then removed with an adequate stent curl noted in the renal pelvis as well as in the bladder.  The bladder was then emptied and the procedure ended.  The patient appeared to tolerate the procedure well and without complications.  The patient was able to be awakened and transferred to the recovery unit in satisfactory condition.    Crist Fat, M.D.

## 2022-06-10 NOTE — Transfer of Care (Signed)
Immediate Anesthesia Transfer of Care Note  Patient: Claudia Walsh  Procedure(s) Performed: Procedure(s): CYSTOSCOPY WITH RETROGRADE PYELOGRAM, URETEROSCOPY AND STENT PLACEMENT (Left)  Patient Location: PACU  Anesthesia Type:General  Level of Consciousness: Alert, Awake, Oriented  Airway & Oxygen Therapy: Patient Spontanous Breathing  Post-op Assessment: Report given to RN  Post vital signs: Reviewed and stable  Last Vitals:  Vitals:   06/10/22 1722  BP: 113/69  Pulse: (!) 47  Resp: 16  Temp: 37 C  SpO2: 99%    Complications: No apparent anesthesia complications

## 2022-06-10 NOTE — H&P (Signed)
42 year old obese female presents today for evaluation of left ureteral stone and poorly controlled pain.   The patient initially presented to the [facility] urgent care with several hours of poorly controlled left-sided flank pain. She had some urinary urgency and frequency. No dysuria. She had severe nausea and some mild emesis. A CT scan was performed and demonstrated a 5 mm left proximal ureteral stone. She was given Toradol and her symptoms improved and she was discharged home.   The patient then represented to the emergency department less than 24 hours later for similar symptoms and poorly controlled pain. A KUB at that time demonstrated little progress for the stone. Again she was given another injection of Toradol and discharged home. Today the patient has had very poorly controlled pain, but her pain leading up to today had been reasonably well-controlled. Denies any fevers or chills.   The patient does have a history of recurrent urinary tract infections.   The patient had her first stone when she was pregnant with her child 15 years ago, since then she has not passed any stones. She has never had any treatment for them.     ALLERGIES: None   MEDICATIONS: Omeprazole 40 mg capsule,delayed release  Tamsulosin Hcl 0.4 mg capsule  24 Hour Nasal Allergy 55 mcg aerosol, spray  Armodafinil 200 mg tablet  Baclofen 10 mg tablet  Dicyclomine Hcl 10 mg capsule  Famotidine 40 mg tablet  Gabapentin 600 mg tablet  Hydroxyzine Hcl 50 mg tablet  Lithium Carbonate  Motrin OTC  Ondansetron Hcl 4 mg tablet  Oxycodone-Acetaminophen 5 mg-325 mg tablet  Symbicort 160 mcg-4.5 mcg/actuation hfa aerosol with adapter  Trintellix 10 mg tablet  Zonisamide 100 mg capsule     GU PSH: None   NON-GU PSH: Cesarean Delivery, 2009 Gastric bypass, 2022 Remove Gallbladder, 2013     GU PMH: None     PMH Notes: Stomach ulcers   NON-GU PMH: Anxiety Asthma Depression GERD Sleep Apnea    FAMILY HISTORY:  1 Daughter - Daughter   SOCIAL HISTORY: Marital Status: Divorced Preferred Language: English; Ethnicity: Not Hispanic Or Latino; Race: White Current Smoking Status: Patient smokes occasionally.   Tobacco Use Assessment Completed: Used Tobacco in last 30 days? Has never drank.  Does not drink caffeine. Patient's occupation Midwife.     Notes: Smoking status: Hookah 2x montly   REVIEW OF SYSTEMS:    GU Review Female:   Patient reports frequent urination, hard to postpone urination, and trouble starting your stream. Patient denies burning /pain with urination, get up at night to urinate, leakage of urine, stream starts and stops, have to strain to urinate, and being pregnant.  Gastrointestinal (Upper):   Patient reports nausea, vomiting, and indigestion/ heartburn.   Gastrointestinal (Lower):   Patient reports constipation. Patient denies diarrhea.  Constitutional:   Patient reports fatigue. Patient denies fever, night sweats, and weight loss.  Skin:   Patient reports itching. Patient denies skin rash/ lesion.  Eyes:   Patient denies blurred vision and double vision.  Ears/ Nose/ Throat:   Patient denies sore throat and sinus problems.  Hematologic/Lymphatic:   Patient denies swollen glands and easy bruising.  Cardiovascular:   Patient denies chest pains and leg swelling.  Respiratory:   Patient denies cough and shortness of breath.  Endocrine:   Patient denies excessive thirst.  Musculoskeletal:   Patient reports back pain. Patient denies joint pain.  Neurological:   Patient reports headaches. Patient denies dizziness.  Psychologic:  Patient reports depression and anxiety.    Notes: ,xcgf    VITAL SIGNS:      06/10/2022 03:35 PM  Weight 255 lb / 115.67 kg  Height 64 in / 162.56 cm  BP 123/77 mmHg  Pulse 56 /min  BMI 43.8 kg/m   MULTI-SYSTEM PHYSICAL EXAMINATION:    Constitutional: Well-nourished. No physical deformities. Normally developed. Good grooming.  Neck: Neck  symmetrical, not swollen. Normal tracheal position.  Respiratory: Normal breath sounds. No labored breathing, no use of accessory muscles.   Cardiovascular: Regular rate and rhythm. No murmur, no gallop. Normal temperature, normal extremity pulses, no swelling, no varicosities.   Lymphatic: No enlargement of neck, axillae, groin.  Skin: No paleness, no jaundice, no cyanosis. No lesion, no ulcer, no rash.  Neurologic / Psychiatric: Oriented to time, oriented to place, oriented to person. No depression, no anxiety, no agitation.  Gastrointestinal: No mass, no tenderness, no rigidity, non obese abdomen.  Eyes: Normal conjunctivae. Normal eyelids.  Ears, Nose, Mouth, and Throat: Left ear no scars, no lesions, no masses. Right ear no scars, no lesions, no masses. Nose no scars, no lesions, no masses. Normal hearing. Normal lips.  Musculoskeletal: Normal gait and station of head and neck.     Complexity of Data:  Source Of History:  Patient  Records Review:   Previous Doctor Records, Previous Hospital Records, Previous Patient Records, POC Tool  Urine Test Review:   Urinalysis  Urodynamics Review:   Review Bladder Scan  X-Ray Review: C.T. Abdomen/Pelvis: Reviewed Films. Discussed With Patient.     PROCEDURES:          Urinalysis w/Scope - 81001 Dipstick Dipstick Cont'd Micro  Color: Amber Bilirubin: Neg WBC/hpf: 10 - 20/hpf  Appearance: Cloudy Ketones: Neg RBC/hpf: 10 - 20/hpf  Specific Gravity: 1.025 Blood: 3+ Bacteria: Mod (26-50/hpf)  pH: 6.0 Protein: Trace Cystals: NS (Not Seen)  Glucose: Neg Urobilinogen: 0.2 Casts: NS (Not Seen)    Nitrites: Neg Trichomonas: Not Present    Leukocyte Esterase: 1+ Mucous: Present      Epithelial Cells: 6 - 10/hpf      Yeast: NS (Not Seen)      Sperm: Not Present    Notes:            Ketoralac 60mg  - 96372, [id number] 60 mg administered, 0 wasted.   Qty: 60 Adm. By: Lyndal Rainbow  Unit: mg Lot No [id number]  Route: IM Exp. Date 08/21/2023  Freq:  None Mfgr.:   Site: Right Buttock   ASSESSMENT:      ICD-10 Details  1 GU:   Ureteral calculus - N20.1    PLAN:            Medications New Meds: Cephalexin 500 mg capsule 1 capsule PO TID   #21  0 Refill(s)  Pharmacy Name:  [id number]  Address:  [address]   [place], [place] [address]  Phone:  [phone]  Fax:  [phone]            Orders Labs Urine Culture          Document Letter(s):  Created for Patient: Clinical Summary         Notes:   The patient's pain has been very poorly controlled. Further, her urine analysis today is somewhat concerning although she is fairly asymptomatic. I recommended that we proceed to the operating room today for ureteral stent placement. This will give [place] a chance started on antibiotics and treat her for  any infection prior to definitive treatment. Will then plan for her to follow-up for shockwave lithotripsy at the next available appointment. I discussed this plan with her in detail explained the rationale for. I offered ureteroscopy as well, she has opted to proceed with stent followed by shockwave lithotripsy. I have not given her any new procedure scription's except for antibiotics. She has plenty of pain medicine for the short-term.

## 2022-06-10 NOTE — H&P (View-Only) (Signed)
42-year-old obese female presents today for evaluation of left ureteral stone and poorly controlled pain.   The patient initially presented to the [facility] urgent care with several hours of poorly controlled left-sided flank pain. She had some urinary urgency and frequency. No dysuria. She had severe nausea and some mild emesis. A CT scan was performed and demonstrated a 5 mm left proximal ureteral stone. She was given Toradol and her symptoms improved and she was discharged home.   The patient then represented to the emergency department less than 24 hours later for similar symptoms and poorly controlled pain. A KUB at that time demonstrated little progress for the stone. Again she was given another injection of Toradol and discharged home. Today the patient has had very poorly controlled pain, but her pain leading up to today had been reasonably well-controlled. Denies any fevers or chills.   The patient does have a history of recurrent urinary tract infections.   The patient had her first stone when she was pregnant with her child 15 years ago, since then she has not passed any stones. She has never had any treatment for them.     ALLERGIES: None   MEDICATIONS: Omeprazole 40 mg capsule,delayed release  Tamsulosin Hcl 0.4 mg capsule  24 Hour Nasal Allergy 55 mcg aerosol, spray  Armodafinil 200 mg tablet  Baclofen 10 mg tablet  Dicyclomine Hcl 10 mg capsule  Famotidine 40 mg tablet  Gabapentin 600 mg tablet  Hydroxyzine Hcl 50 mg tablet  Lithium Carbonate  Motrin OTC  Ondansetron Hcl 4 mg tablet  Oxycodone-Acetaminophen 5 mg-325 mg tablet  Symbicort 160 mcg-4.5 mcg/actuation hfa aerosol with adapter  Trintellix 10 mg tablet  Zonisamide 100 mg capsule     GU PSH: None   NON-GU PSH: Cesarean Delivery, 2009 Gastric bypass, 2022 Remove Gallbladder, 2013     GU PMH: None     PMH Notes: Stomach ulcers   NON-GU PMH: Anxiety Asthma Depression GERD Sleep Apnea    FAMILY HISTORY:  1 Daughter - Daughter   SOCIAL HISTORY: Marital Status: Divorced Preferred Language: English; Ethnicity: Not Hispanic Or Latino; Race: White Current Smoking Status: Patient smokes occasionally.   Tobacco Use Assessment Completed: Used Tobacco in last 30 days? Has never drank.  Does not drink caffeine. 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     Notes: Smoking status: Hookah 2x montly   REVIEW OF SYSTEMS:    GU Review Female:   Patient reports frequent urination, hard to postpone urination, and trouble starting your stream. Patient denies burning /pain with urination, get up at night to urinate, leakage of urine, stream starts and stops, have to strain to urinate, and being pregnant.  Gastrointestinal (Upper):   Patient reports nausea, vomiting, and indigestion/ heartburn.   Gastrointestinal (Lower):   Patient reports constipation. Patient denies diarrhea.  Constitutional:   Patient reports fatigue. Patient denies fever, night sweats, and weight loss.  Skin:   Patient reports itching. Patient denies skin rash/ lesion.  Eyes:   Patient denies blurred vision and double vision.  Ears/ Nose/ Throat:   Patient denies sore throat and sinus problems.  Hematologic/Lymphatic:   Patient denies swollen glands and easy bruising.  Cardiovascular:   Patient denies chest pains and leg swelling.  Respiratory:   Patient denies cough and shortness of breath.  Endocrine:   Patient denies excessive thirst.  Musculoskeletal:   Patient reports back pain. Patient denies joint pain.  Neurological:   Patient reports headaches. Patient denies dizziness.  Psychologic:     Patient reports depression and anxiety.    Notes: ,xcgf    VITAL SIGNS:      06/10/2022 03:35 PM  Weight 255 lb / 115.67 kg  Height 64 in / 162.56 cm  BP 123/77 mmHg  Pulse 56 /min  BMI 43.8 kg/m   MULTI-SYSTEM PHYSICAL EXAMINATION:    Constitutional: Well-nourished. No physical deformities. Normally developed. Good grooming.  Neck: Neck  symmetrical, not swollen. Normal tracheal position.  Respiratory: Normal breath sounds. No labored breathing, no use of accessory muscles.   Cardiovascular: Regular rate and rhythm. No murmur, no gallop. Normal temperature, normal extremity pulses, no swelling, no varicosities.   Lymphatic: No enlargement of neck, axillae, groin.  Skin: No paleness, no jaundice, no cyanosis. No lesion, no ulcer, no rash.  Neurologic / Psychiatric: Oriented to time, oriented to place, oriented to person. No depression, no anxiety, no agitation.  Gastrointestinal: No mass, no tenderness, no rigidity, non obese abdomen.  Eyes: Normal conjunctivae. Normal eyelids.  Ears, Nose, Mouth, and Throat: Left ear no scars, no lesions, no masses. Right ear no scars, no lesions, no masses. Nose no scars, no lesions, no masses. Normal hearing. Normal lips.  Musculoskeletal: Normal gait and station of head and neck.     Complexity of Data:  Source Of History:  Patient  Records Review:   Previous Doctor Records, Previous Hospital Records, Previous Patient Records, POC Tool  Urine Test Review:   Urinalysis  Urodynamics Review:   Review Bladder Scan  X-Ray Review: C.T. Abdomen/Pelvis: Reviewed Films. Discussed With Patient.     PROCEDURES:          Urinalysis w/Scope - 81001 Dipstick Dipstick Cont'd Micro  Color: Amber Bilirubin: Neg WBC/hpf: 10 - 20/hpf  Appearance: Cloudy Ketones: Neg RBC/hpf: 10 - 20/hpf  Specific Gravity: 1.025 Blood: 3+ Bacteria: Mod (26-50/hpf)  pH: 6.0 Protein: Trace Cystals: NS (Not Seen)  Glucose: Neg Urobilinogen: 0.2 Casts: NS (Not Seen)    Nitrites: Neg Trichomonas: Not Present    Leukocyte Esterase: 1+ Mucous: Present      Epithelial Cells: 6 - 10/hpf      Yeast: NS (Not Seen)      Sperm: Not Present    Notes:            Ketoralac 60mg - 96372, [id number] 60 mg administered, 0 wasted.   Qty: 60 Adm. By: Ashley Lewis  Unit: mg Lot No [id number]  Route: IM Exp. Date 08/21/2023  Freq:  None Mfgr.:   Site: Right Buttock   ASSESSMENT:      ICD-10 Details  1 GU:   Ureteral calculus - N20.1    PLAN:            Medications New Meds: Cephalexin 500 mg capsule 1 capsule PO TID   #21  0 Refill(s)  Pharmacy Name:  [id number]  Address:  [address]   [place], [place] [address]  Phone:  [phone]  Fax:  [phone]            Orders Labs Urine Culture          Document Letter(s):  Created for Patient: Clinical Summary         Notes:   The patient's pain has been very poorly controlled. Further, her urine analysis today is somewhat concerning although she is fairly asymptomatic. I recommended that we proceed to the operating room today for ureteral stent placement. This will give us a chance started on antibiotics and treat her for   any infection prior to definitive treatment. Will then plan for her to follow-up for shockwave lithotripsy at the next available appointment. I discussed this plan with her in detail explained the rationale for. I offered ureteroscopy as well, she has opted to proceed with stent followed by shockwave lithotripsy. I have not given her any new procedure scription's except for antibiotics. She has plenty of pain medicine for the short-term.   

## 2022-06-10 NOTE — Discharge Instructions (Signed)
DISCHARGE INSTRUCTIONS FOR KIDNEY STONE/URETERAL STENT   MEDICATIONS:  1.  Resume all your other meds from home.   ACTIVITY:  1. No strenuous activity x 1week  2. No driving while on narcotic pain medications  3. Drink plenty of water  4. Continue to walk at home - you can still get blood clots when you are at home, so keep active, but don't over do it.  5. May return to work/school tomorrow or when you feel ready   BATHING:  1. You can shower and we recommend daily showers  2. You have a string coming from your urethra: The stent string is attached to your ureteral stent. Do not pull on this.   SIGNS/SYMPTOMS TO CALL:  Please call us if you have a fever greater than 101.5, uncontrolled nausea/vomiting, uncontrolled pain, dizziness, unable to urinate, bloody urine, chest pain, shortness of breath, leg swelling, leg pain, redness around wound, drainage from wound, or any other concerns or questions.   You can reach [place] at [phone].   FOLLOW-UP:  1. You will be scheduled for removal of your stone in the coming week.

## 2022-06-10 NOTE — Anesthesia Procedure Notes (Signed)
Procedure Name: Intubation Date/Time: 06/10/2022 6:35 PM  Performed by: Basilio Cairo, CRNAPre-anesthesia Checklist: Patient identified, Patient being monitored, Timeout performed, Emergency Drugs available and Suction available Patient Re-evaluated:Patient Re-evaluated prior to induction Oxygen Delivery Method: Circle system utilized Preoxygenation: Pre-oxygenation with 100% oxygen Induction Type: IV induction and Rapid sequence Ventilation: Mask ventilation without difficulty Laryngoscope Size: Mac and 3 Grade View: Grade I Tube type: Oral Tube size: 7.0 mm Number of attempts: 1 Airway Equipment and Method: Stylet Placement Confirmation: ETT inserted through vocal cords under direct vision, positive ETCO2 and breath sounds checked- equal and bilateral Secured at: 21 cm Tube secured with: Tape Dental Injury: Teeth and Oropharynx as per pre-operative assessment

## 2022-06-10 NOTE — Interval H&P Note (Signed)
History and Physical Interval Note:  06/10/2022 6:22 PM  Claudia Walsh  has presented today for surgery, with the diagnosis of LEFT URETERAL STONE.  The various methods of treatment have been discussed with the patient and family. After consideration of risks, benefits and other options for treatment, the patient has consented to  Procedure(s): CYSTOSCOPY WITH RETROGRADE PYELOGRAM, URETEROSCOPY AND STENT PLACEMENT (Left) as a surgical intervention.  The patient's history has been reviewed, patient examined, no change in status, stable for surgery.  I have reviewed the patient's chart and labs.  Questions were answered to the patient's satisfaction.     Crist Fat

## 2022-06-10 NOTE — Anesthesia Preprocedure Evaluation (Signed)
Anesthesia Evaluation  Patient identified by MRN, date of birth, ID band Patient awake    Reviewed: Allergy & Precautions, H&P , NPO status , Patient's Chart, lab work & pertinent test results  Airway Mallampati: II   Neck ROM: full    Dental   Pulmonary asthma , sleep apnea    breath sounds clear to auscultation       Cardiovascular negative cardio ROS  Rhythm:regular Rate:Normal     Neuro/Psych  Headaches PSYCHIATRIC DISORDERS Anxiety Depression       GI/Hepatic ,GERD  ,,  Endo/Other    Renal/GU      Musculoskeletal   Abdominal   Peds  Hematology   Anesthesia Other Findings   Reproductive/Obstetrics                             Anesthesia Physical Anesthesia Plan  ASA: 2  Anesthesia Plan: General   Post-op Pain Management:    Induction: Intravenous  PONV Risk Score and Plan: 3 and Ondansetron, Dexamethasone, Midazolam and Treatment may vary due to age or medical condition  Airway Management Planned: Oral ETT  Additional Equipment:   Intra-op Plan:   Post-operative Plan: Extubation in OR  Informed Consent: I have reviewed the patients History and Physical, chart, labs and discussed the procedure including the risks, benefits and alternatives for the proposed anesthesia with the patient or authorized representative who has indicated his/her understanding and acceptance.     Dental advisory given  Plan Discussed with: CRNA, Anesthesiologist and Surgeon  Anesthesia Plan Comments:        Anesthesia Quick Evaluation

## 2022-06-11 ENCOUNTER — Other Ambulatory Visit: Payer: Self-pay

## 2022-06-11 ENCOUNTER — Encounter (HOSPITAL_COMMUNITY): Payer: Self-pay | Admitting: Urology

## 2022-06-11 ENCOUNTER — Other Ambulatory Visit: Payer: Self-pay | Admitting: Urology

## 2022-06-11 NOTE — Progress Notes (Addendum)
Spoke w/ via phone for pre-op interview---pt Lab needs dos----   urine preg poct            Lab results------echo 09-25-2021 epic COVID test -----patient states asymptomatic no test needed Arrive at -------930 am 06-19-2022 NPO after MN NO Solid Food.  Clear liquids from MN until---830 am Med rec completed Medications to take morning of surgery -----buspirone, omeprazole, trentellix Diabetic medication -----n/a Patient instructed no nail polish to be worn day of surgery Patient instructed to bring photo id and insurance card day of surgery Patient aware to have Driver (ride ) / caregiver    for 24 hours after surgery  Patient Special Instructions ----- bring cpap mask  tubing and machine and leave in car Pre-Op special Instructions -----none Patient verbalized understanding of instructions that were given at this phone interview. Patient denies shortness of breath, chest pain, fever, cough at this phone interview.

## 2022-06-12 NOTE — Anesthesia Postprocedure Evaluation (Signed)
Anesthesia Post Note  Patient: Claudia Walsh  Procedure(s) Performed: CYSTOSCOPY WITH RETROGRADE PYELOGRAM, URETEROSCOPY AND STENT PLACEMENT (Left)     Patient location during evaluation: PACU Anesthesia Type: General Level of consciousness: awake and alert Pain management: pain level controlled Vital Signs Assessment: post-procedure vital signs reviewed and stable Respiratory status: spontaneous breathing, nonlabored ventilation, respiratory function stable and patient connected to nasal cannula oxygen Cardiovascular status: blood pressure returned to baseline and stable Postop Assessment: no apparent nausea or vomiting Anesthetic complications: no   No notable events documented.  Last Vitals:  Vitals:   06/10/22 2000 06/10/22 2014  BP: (!) 113/50 109/71  Pulse: (!) 53 (!) 56  Resp: 15 14  Temp:  36.5 C  SpO2: 100% 99%    Last Pain:  Vitals:   06/10/22 2014  TempSrc:   PainSc: 0-No pain                 Andre Gallego S

## 2022-06-13 DIAGNOSIS — N12 Tubulo-interstitial nephritis, not specified as acute or chronic: Secondary | ICD-10-CM | POA: Diagnosis not present

## 2022-06-13 DIAGNOSIS — R509 Fever, unspecified: Secondary | ICD-10-CM | POA: Diagnosis not present

## 2022-06-17 ENCOUNTER — Ambulatory Visit: Payer: BC Managed Care – PPO | Admitting: Internal Medicine

## 2022-06-18 DIAGNOSIS — M791 Myalgia, unspecified site: Secondary | ICD-10-CM | POA: Diagnosis not present

## 2022-06-18 DIAGNOSIS — M542 Cervicalgia: Secondary | ICD-10-CM | POA: Diagnosis not present

## 2022-06-18 DIAGNOSIS — G43719 Chronic migraine without aura, intractable, without status migrainosus: Secondary | ICD-10-CM | POA: Diagnosis not present

## 2022-06-18 DIAGNOSIS — G518 Other disorders of facial nerve: Secondary | ICD-10-CM | POA: Diagnosis not present

## 2022-06-19 ENCOUNTER — Ambulatory Visit (HOSPITAL_BASED_OUTPATIENT_CLINIC_OR_DEPARTMENT_OTHER)
Admission: RE | Admit: 2022-06-19 | Discharge: 2022-06-19 | Disposition: A | Discharge status: Home / Self Care | Payer: BC Managed Care – PPO | Attending: Urology | Admitting: Urology

## 2022-06-19 ENCOUNTER — Ambulatory Visit (HOSPITAL_BASED_OUTPATIENT_CLINIC_OR_DEPARTMENT_OTHER): Payer: BC Managed Care – PPO | Admitting: Anesthesiology

## 2022-06-19 ENCOUNTER — Encounter (HOSPITAL_BASED_OUTPATIENT_CLINIC_OR_DEPARTMENT_OTHER)
Admission: RE | Disposition: A | Discharge status: Home / Self Care | Payer: Self-pay | Source: Home / Self Care | Attending: Urology

## 2022-06-19 ENCOUNTER — Encounter (HOSPITAL_BASED_OUTPATIENT_CLINIC_OR_DEPARTMENT_OTHER): Payer: Self-pay | Admitting: Urology

## 2022-06-19 ENCOUNTER — Other Ambulatory Visit: Payer: Self-pay

## 2022-06-19 DIAGNOSIS — Z6841 Body Mass Index (BMI) 40.0 and over, adult: Secondary | ICD-10-CM | POA: Diagnosis not present

## 2022-06-19 DIAGNOSIS — N201 Calculus of ureter: Secondary | ICD-10-CM | POA: Diagnosis not present

## 2022-06-19 DIAGNOSIS — K219 Gastro-esophageal reflux disease without esophagitis: Secondary | ICD-10-CM | POA: Insufficient documentation

## 2022-06-19 DIAGNOSIS — G4730 Sleep apnea, unspecified: Secondary | ICD-10-CM | POA: Insufficient documentation

## 2022-06-19 DIAGNOSIS — F17200 Nicotine dependence, unspecified, uncomplicated: Secondary | ICD-10-CM | POA: Insufficient documentation

## 2022-06-19 DIAGNOSIS — F419 Anxiety disorder, unspecified: Secondary | ICD-10-CM | POA: Diagnosis not present

## 2022-06-19 DIAGNOSIS — Z01818 Encounter for other preprocedural examination: Secondary | ICD-10-CM

## 2022-06-19 DIAGNOSIS — Z7951 Long term (current) use of inhaled steroids: Secondary | ICD-10-CM | POA: Insufficient documentation

## 2022-06-19 DIAGNOSIS — F418 Other specified anxiety disorders: Secondary | ICD-10-CM | POA: Diagnosis not present

## 2022-06-19 DIAGNOSIS — J45909 Unspecified asthma, uncomplicated: Secondary | ICD-10-CM | POA: Insufficient documentation

## 2022-06-19 DIAGNOSIS — N200 Calculus of kidney: Secondary | ICD-10-CM

## 2022-06-19 HISTORY — DX: Cyclothymic disorder: F34.0

## 2022-06-19 HISTORY — PX: CYSTOSCOPY/URETEROSCOPY/HOLMIUM LASER/STENT PLACEMENT: SHX6546

## 2022-06-19 HISTORY — DX: Irritable bowel syndrome, unspecified: K58.9

## 2022-06-19 LAB — POCT PREGNANCY, URINE: Preg Test, Ur: NEGATIVE

## 2022-06-19 SURGERY — CYSTOSCOPY/URETEROSCOPY/HOLMIUM LASER/STENT PLACEMENT
Anesthesia: General | Site: Renal | Laterality: Left

## 2022-06-19 MED ORDER — GLYCOPYRROLATE 0.2 MG/ML IJ SOLN
INTRAMUSCULAR | Status: DC | PRN
  Administered 2022-06-19 (×2): .1 mg via INTRAVENOUS

## 2022-06-19 MED ORDER — PROPOFOL 10 MG/ML IV BOLUS
INTRAVENOUS | Status: DC | PRN
  Administered 2022-06-19: 200 mg via INTRAVENOUS

## 2022-06-19 MED ORDER — MIDAZOLAM HCL 2 MG/2ML IJ SOLN
INTRAMUSCULAR | Status: AC
  Filled 2022-06-19: qty 2

## 2022-06-19 MED ORDER — KETOROLAC TROMETHAMINE 15 MG/ML IJ SOLN
15.0000 mg | Freq: Once | INTRAMUSCULAR | Status: AC
  Administered 2022-06-19: 15 mg via INTRAVENOUS

## 2022-06-19 MED ORDER — PHENAZOPYRIDINE HCL 200 MG PO TABS: 200.0000 mg | ORAL_TABLET | Freq: Three times a day (TID) | ORAL | 0 refills | Status: DC | PRN

## 2022-06-19 MED ORDER — LIDOCAINE HCL (PF) 2 % IJ SOLN
INTRAMUSCULAR | Status: AC
  Filled 2022-06-19: qty 10

## 2022-06-19 MED ORDER — FENTANYL CITRATE (PF) 100 MCG/2ML IJ SOLN
INTRAMUSCULAR | Status: AC
  Filled 2022-06-19: qty 2

## 2022-06-19 MED ORDER — TRAMADOL HCL 50 MG PO TABS: 50.0000 mg | ORAL_TABLET | Freq: Four times a day (QID) | ORAL | 0 refills | Status: DC | PRN

## 2022-06-19 MED ORDER — SODIUM CHLORIDE 0.9 % IR SOLN
Status: DC | PRN
  Administered 2022-06-19: 1000 mL

## 2022-06-19 MED ORDER — KETOROLAC TROMETHAMINE 15 MG/ML IJ SOLN
INTRAMUSCULAR | Status: AC
  Filled 2022-06-19: qty 1

## 2022-06-19 MED ORDER — DEXAMETHASONE SODIUM PHOSPHATE 10 MG/ML IJ SOLN
INTRAMUSCULAR | Status: AC
  Filled 2022-06-19: qty 2

## 2022-06-19 MED ORDER — LIDOCAINE HCL (CARDIAC) PF 100 MG/5ML IV SOSY
PREFILLED_SYRINGE | INTRAVENOUS | Status: DC | PRN
  Administered 2022-06-19: 50 mg via INTRAVENOUS

## 2022-06-19 MED ORDER — CIPROFLOXACIN IN D5W 400 MG/200ML IV SOLN
400.0000 mg | INTRAVENOUS | Status: AC
  Administered 2022-06-19: 400 mg via INTRAVENOUS

## 2022-06-19 MED ORDER — DEXAMETHASONE SODIUM PHOSPHATE 4 MG/ML IJ SOLN
INTRAMUSCULAR | Status: DC | PRN
  Administered 2022-06-19: 8 mg via INTRAVENOUS

## 2022-06-19 MED ORDER — EPHEDRINE 5 MG/ML INJ
INTRAVENOUS | Status: AC
  Filled 2022-06-19: qty 5

## 2022-06-19 MED ORDER — PROPOFOL 1000 MG/100ML IV EMUL
INTRAVENOUS | Status: AC
  Filled 2022-06-19: qty 100

## 2022-06-19 MED ORDER — MIDAZOLAM HCL 2 MG/2ML IJ SOLN
INTRAMUSCULAR | Status: DC | PRN
  Administered 2022-06-19: 2 mg via INTRAVENOUS

## 2022-06-19 MED ORDER — FENTANYL CITRATE (PF) 100 MCG/2ML IJ SOLN
INTRAMUSCULAR | Status: DC | PRN
  Administered 2022-06-19: 50 ug via INTRAVENOUS

## 2022-06-19 MED ORDER — LACTATED RINGERS IV SOLN
INTRAVENOUS | Status: DC
  Administered 2022-06-19: 1000 mL via INTRAVENOUS

## 2022-06-19 MED ORDER — EPHEDRINE SULFATE (PRESSORS) 50 MG/ML IJ SOLN
INTRAMUSCULAR | Status: DC | PRN
  Administered 2022-06-19 (×3): 5 mg via INTRAVENOUS

## 2022-06-19 MED ORDER — CIPROFLOXACIN IN D5W 400 MG/200ML IV SOLN
INTRAVENOUS | Status: AC
  Filled 2022-06-19: qty 200

## 2022-06-19 MED ORDER — ONDANSETRON HCL 4 MG/2ML IJ SOLN
INTRAMUSCULAR | Status: AC
  Filled 2022-06-19: qty 4

## 2022-06-19 MED ORDER — IOHEXOL 300 MG/ML  SOLN
INTRAMUSCULAR | Status: DC | PRN
  Administered 2022-06-19: 7 mL via URETHRAL

## 2022-06-19 MED ORDER — ONDANSETRON HCL 4 MG/2ML IJ SOLN
INTRAMUSCULAR | Status: DC | PRN
  Administered 2022-06-19: 4 mg via INTRAVENOUS

## 2022-06-19 SURGICAL SUPPLY — 21 items
BAG DRAIN URO-CYSTO SKYTR STRL (DRAIN) ×1 IMPLANT
BAG DRN UROCATH (DRAIN) ×1
BASKET LASER NITINOL 1.9FR (BASKET) IMPLANT
BSKT STON RTRVL 120 1.9FR (BASKET)
CATH URETERAL DUAL LUMEN 10F (MISCELLANEOUS) IMPLANT
CATH URETL OPEN 5X70 (CATHETERS) ×1 IMPLANT
CLOTH BEACON ORANGE TIMEOUT ST (SAFETY) ×1 IMPLANT
EXTRACTOR STONE 1.7FRX115CM (UROLOGICAL SUPPLIES) IMPLANT
GLOVE BIO SURGEON STRL SZ7.5 (GLOVE) ×1 IMPLANT
GOWN STRL REUS W/TWL XL LVL3 (GOWN DISPOSABLE) ×1 IMPLANT
GUIDEWIRE STR DUAL SENSOR (WIRE) ×1 IMPLANT
IV NS IRRIG 3000ML ARTHROMATIC (IV SOLUTION) ×2 IMPLANT
KIT TURNOVER CYSTO (KITS) ×1 IMPLANT
MANIFOLD NEPTUNE II (INSTRUMENTS) ×1 IMPLANT
NS IRRIG 500ML POUR BTL (IV SOLUTION) ×1 IMPLANT
PACK CYSTO (CUSTOM PROCEDURE TRAY) ×1 IMPLANT
SLEEVE SCD COMPRESS KNEE MED (STOCKING) ×1 IMPLANT
TRACTIP FLEXIVA PULS ID 200XHI (Laser) IMPLANT
TRACTIP FLEXIVA PULSE ID 200 (Laser)
TUBE CONNECTING 12X1/4 (SUCTIONS) IMPLANT
TUBING UROLOGY SET (TUBING) ×1 IMPLANT

## 2022-06-19 NOTE — Anesthesia Preprocedure Evaluation (Signed)
Anesthesia Evaluation  Patient identified by MRN, date of birth, ID band Patient awake    Reviewed: Allergy & Precautions, H&P , NPO status , Patient's Chart, lab work & pertinent test results  Airway Mallampati: II   Neck ROM: full    Dental no notable dental hx.    Pulmonary asthma , sleep apnea    breath sounds clear to auscultation       Cardiovascular negative cardio ROS  Rhythm:regular Rate:Normal     Neuro/Psych  Headaches  Anxiety Depression     negative psych ROS   GI/Hepatic Neg liver ROS,GERD  ,,  Endo/Other    Morbid obesity  Renal/GU Renal disease  negative genitourinary   Musculoskeletal negative musculoskeletal ROS (+)    Abdominal  (+) + obese  Peds negative pediatric ROS (+)  Hematology negative hematology ROS (+)   Anesthesia Other Findings   Reproductive/Obstetrics negative OB ROS                             Anesthesia Physical Anesthesia Plan  ASA: 3  Anesthesia Plan: General   Post-op Pain Management:    Induction: Intravenous  PONV Risk Score and Plan: 3 and Ondansetron, Dexamethasone, Midazolam and Treatment may vary due to age or medical condition  Airway Management Planned: LMA  Additional Equipment:   Intra-op Plan:   Post-operative Plan: Extubation in OR  Informed Consent: I have reviewed the patients History and Physical, chart, labs and discussed the procedure including the risks, benefits and alternatives for the proposed anesthesia with the patient or authorized representative who has indicated his/her understanding and acceptance.     Dental advisory given  Plan Discussed with: CRNA, Anesthesiologist and Surgeon  Anesthesia Plan Comments:        Anesthesia Quick Evaluation

## 2022-06-19 NOTE — Transfer of Care (Signed)
Immediate Anesthesia Transfer of Care Note  Patient: Claudia Walsh  Procedure(s) Performed: CYSTOSCOPY, LEFT URETEROSCOPY, RETROGRADE PYELOGRAM, BASKET STONE EXTRACTION (Left: Renal)  Patient Location: PACU  Anesthesia Type:General  Level of Consciousness: awake, alert , oriented, and patient cooperative  Airway & Oxygen Therapy: Patient Spontanous Breathing and Patient connected to nasal cannula oxygen  Post-op Assessment: Report given to RN and Post -op Vital signs reviewed and stable  Post vital signs: Reviewed and stable  Last Vitals:  Vitals Value Taken Time  BP 115/84 06/19/22 0913  Temp    Pulse 71 06/19/22 0914  Resp 15 06/19/22 0914  SpO2 99 % 06/19/22 0914  Vitals shown include unvalidated device data.  Last Pain:  Vitals:   06/19/22 0729  TempSrc: Oral  PainSc: 5       Patients Stated Pain Goal: 5 (06/19/22 0729)  Complications: No notable events documented.

## 2022-06-19 NOTE — Op Note (Signed)
Preoperative diagnosis: left ureteral calculus  Postoperative diagnosis: left ureteral calculus  Procedure:  Cystoscopy left ureteroscopy and stone removal Ureteroscopic laser lithotripsy left 51F x 24 ureteral stent removal left retrograde pyelography with interpretation  Surgeon: Crist Fat, MD  Anesthesia: General  Complications: None  Intraoperative findings: left retrograde pyelography demonstrated a filling defect within the left ureter consistent with the patient's known calculus without other abnormalities. Minimal ureteral trauma.  Once stone was removed a completion retrograde pyelogram demonstrated no additional filling defects and brisk drainage of the contrast  EBL: Minimal  Specimens: left ureteral calculus   Indication: Claudia Walsh is a 42 y.o.   patient with a left ureteral stone and associated left symptoms. Previously she had a stent placed and her infection was treated.  After reviewing the management options for treatment, the patient elected to proceed with the above surgical procedure(s). We have discussed the potential benefits and risks of the procedure, side effects of the proposed treatment, the likelihood of the patient achieving the goals of the procedure, and any potential problems that might occur during the procedure or recuperation. Informed consent has been obtained.   Description of procedure:  The patient was taken to the operating room and general anesthesia was induced.  The patient was placed in the dorsal lithotomy position, prepped and draped in the usual sterile fashion, and preoperative antibiotics were administered. A preoperative time-out was performed.   Cystourethroscopy was performed.  The patient's urethra was examined and was normal.  The bladder was then systematically examined in its entirety. There was no evidence for any bladder tumors, stones, or other mucosal pathology.  The stent was grasped from the ureteral  orifice and pulled to the urethral meatus.  A wire was then advanced through the stent and the stent completely removed over the wire.   A ureteral catheter was then exchanged and a retrograde pyelogram was performed with findings as dictated above.  A 0.38 sensor guidewire was then advanced up the left ureter through the urethral catheter into the renal pelvis under fluoroscopic guidance. The 6 Fr semirigid ureteroscope was then advanced into the ureter next to the guidewire and the calculus was identified.  The stone was then grasped with the engage basket and removed atraumatically from the ureter with no significant ureteral medication.  There was no stone fragmentation required.  I then exchanged the safety wire through the open-ended ureteral catheter again and performed a completion retrograde pyelogram and no additional filling defects.  The contrast drained briskly.  This junction I opted not to place a stent.  The bladder was then emptied and the procedure ended.  The patient appeared to tolerate the procedure well and without complications.  The patient was able to be awakened and transferred to the recovery unit in satisfactory condition.   Disposition: Patient will be scheduled for follow-up in 6 weeks with a renal ultrasound prior.

## 2022-06-19 NOTE — Anesthesia Procedure Notes (Signed)
Procedure Name: LMA Insertion Date/Time: 06/19/2022 8:40 AM  Performed by: Earmon Phoenix, CRNAPre-anesthesia Checklist: Patient identified, Emergency Drugs available, Suction available, Patient being monitored and Timeout performed Patient Re-evaluated:Patient Re-evaluated prior to induction Oxygen Delivery Method: Circle system utilized Preoxygenation: Pre-oxygenation with 100% oxygen Induction Type: IV induction Ventilation: Mask ventilation without difficulty LMA: LMA inserted LMA Size: 4.0 Number of attempts: 1 Placement Confirmation: positive ETCO2 and breath sounds checked- equal and bilateral Tube secured with: Tape Dental Injury: Teeth and Oropharynx as per pre-operative assessment

## 2022-06-19 NOTE — Discharge Instructions (Addendum)
DISCHARGE INSTRUCTIONS FOR KIDNEY STONE/URETERAL STENT   MEDICATIONS:  1. Resume all your other meds from home - except do not take any extra narcotic pain meds that you may have at home.  2. Pyridium is to help with the burning/stinging when you urinate. 3. Tramadol is for moderate/severe pain, otherwise taking upto 1000 mg every 6 hours of plainTylenol will help treat your pain.   4. Complete the antibiotics as prescribed.  ACTIVITY:  1. No strenuous activity x 1week  2. No driving while on narcotic pain medications  3. Drink plenty of water  4. Continue to walk at home - you can still get blood clots when you are at home, so keep active, but don't over do it.  5. May return to work/school tomorrow or when you feel ready   BATHING:  1. You can shower and we recommend daily showers    SIGNS/SYMPTOMS TO CALL:  Please call us if you have a fever greater than 101.5, uncontrolled nausea/vomiting, uncontrolled pain, dizziness, unable to urinate, bloody urine, chest pain, shortness of breath, leg swelling, leg pain, redness around wound, drainage from wound, or any other concerns or questions.   You can reach [place] at [phone].   FOLLOW-UP:  1. You have an appointment in 6 weeks with a ultrasound of your kidneys prior.     CYSTOSCOPY HOME CARE INSTRUCTIONS  Activity: Rest for the remainder of the day.  Do not drive or operate equipment today.  You may resume normal activities in one to two days as instructed by your physician.   Meals: Drink plenty of liquids and eat light foods such as gelatin or soup this evening.  You may return to a normal meal plan tomorrow.  Return to Work: You may return to work in one to two days or as instructed by your physician.  Special Instructions / Symptoms: Call your physician if any of these symptoms occur:   -persistent or heavy bleeding  -bleeding which continues after first few urination  -large blood clots that are difficult to  pass  -urine stream diminishes or stops completely  -fever equal to or higher than 101 degrees Farenheit.  -cloudy urine with a strong, foul odor  -severe pain  Females should always wipe from front to back after elimination.  You may feel some burning pain when you urinate.  This should disappear with time.  Applying moist heat to the lower abdomen or a hot tub bath may help relieve the pain.      Post Anesthesia Home Care Instructions  Activity: Get plenty of rest for the remainder of the day. A responsible individual must stay with you for 24 hours following the procedure.  For the next 24 hours, DO NOT: -Drive a car -Advertising copywriter -Drink alcoholic beverages -Take any medication unless instructed by your physician -Make any legal decisions or sign important papers.  Meals: Start with liquid foods such as gelatin or soup. Progress to regular foods as tolerated. Avoid greasy, spicy, heavy foods. If nausea and/or vomiting occur, drink only clear liquids until the nausea and/or vomiting subsides. Call your physician if vomiting continues.  Special Instructions/Symptoms: Your throat may feel dry or sore from the anesthesia or the breathing tube placed in your throat during surgery. If this causes discomfort, gargle with warm salt water. The discomfort should disappear within 24 hours.  If you had a scopolamine patch placed behind your ear for the management of post- operative nausea and/or vomiting:  1. The medication  in the patch is effective for 72 hours, after which it should be removed.  Wrap patch in a tissue and discard in the trash. Wash hands thoroughly with soap and water. 2. You may remove the patch earlier than 72 hours if you experience unpleasant side effects which may include dry mouth, dizziness or visual disturbances. 3. Avoid touching the patch. Wash your hands with soap and water after contact with the patch.

## 2022-06-19 NOTE — Anesthesia Postprocedure Evaluation (Signed)
Anesthesia Post Note  Patient: Claudia Walsh  Procedure(s) Performed: CYSTOSCOPY, LEFT URETEROSCOPY, RETROGRADE PYELOGRAM, BASKET STONE EXTRACTION; STENT REMOVAL (Left: Renal)     Patient location during evaluation: PACU Anesthesia Type: General Level of consciousness: awake and alert Pain management: pain level controlled Vital Signs Assessment: post-procedure vital signs reviewed and stable Respiratory status: spontaneous breathing, nonlabored ventilation and respiratory function stable Cardiovascular status: blood pressure returned to baseline and stable Postop Assessment: no apparent nausea or vomiting Anesthetic complications: no   No notable events documented.  Last Vitals:  Vitals:   06/19/22 0930 06/19/22 0951  BP: 112/68 123/74  Pulse: 65 62  Resp: 16 16  Temp:  36.6 C  SpO2: 98% 100%    Last Pain:  Vitals:   06/19/22 0951  TempSrc:   PainSc: 0-No pain                 Lowella Curb

## 2022-06-19 NOTE — Interval H&P Note (Signed)
History and Physical Interval Note:  06/19/2022 7:53 AM  Claudia Walsh  has presented today for surgery, with the diagnosis of LEFT URETERAL STONE.  The various methods of treatment have been discussed with the patient and family. After consideration of risks, benefits and other options for treatment, the patient has consented to  Procedure(s): CYSTOSCOPY, LEFT URETEROSCOPY, HOLMIUM LASER LITHOTRIPSY, AND LEFT URETERAL STENT PLACEMENT (Left) as a surgical intervention.  The patient's history has been reviewed, patient examined, no change in status, stable for surgery.  I have reviewed the patient's chart and labs.  Questions were answered to the patient's satisfaction.     Crist Fat

## 2022-06-20 DIAGNOSIS — G4730 Sleep apnea, unspecified: Secondary | ICD-10-CM | POA: Diagnosis not present

## 2022-06-20 DIAGNOSIS — G4723 Circadian rhythm sleep disorder, irregular sleep wake type: Secondary | ICD-10-CM | POA: Diagnosis not present

## 2022-06-20 DIAGNOSIS — F411 Generalized anxiety disorder: Secondary | ICD-10-CM | POA: Diagnosis not present

## 2022-06-23 ENCOUNTER — Ambulatory Visit: Payer: Self-pay | Admitting: Physician Assistant

## 2022-06-23 ENCOUNTER — Encounter (HOSPITAL_BASED_OUTPATIENT_CLINIC_OR_DEPARTMENT_OTHER): Payer: Self-pay | Admitting: Urology

## 2022-06-27 LAB — CALCULI, WITH PHOTOGRAPH (CLINICAL LAB)
Calcium Oxalate Monohydrate: 100 %
Weight Calculi: 36 mg

## 2022-07-04 DIAGNOSIS — F39 Unspecified mood [affective] disorder: Secondary | ICD-10-CM | POA: Diagnosis not present

## 2022-07-04 DIAGNOSIS — R42 Dizziness and giddiness: Secondary | ICD-10-CM | POA: Diagnosis not present

## 2022-07-07 DIAGNOSIS — G518 Other disorders of facial nerve: Secondary | ICD-10-CM | POA: Diagnosis not present

## 2022-07-07 DIAGNOSIS — M791 Myalgia, unspecified site: Secondary | ICD-10-CM | POA: Diagnosis not present

## 2022-07-07 DIAGNOSIS — M542 Cervicalgia: Secondary | ICD-10-CM | POA: Diagnosis not present

## 2022-07-07 DIAGNOSIS — G43719 Chronic migraine without aura, intractable, without status migrainosus: Secondary | ICD-10-CM | POA: Diagnosis not present

## 2022-07-22 DIAGNOSIS — R0683 Snoring: Secondary | ICD-10-CM | POA: Diagnosis not present

## 2022-07-22 DIAGNOSIS — G4721 Circadian rhythm sleep disorder, delayed sleep phase type: Secondary | ICD-10-CM | POA: Diagnosis not present

## 2022-07-22 DIAGNOSIS — G4719 Other hypersomnia: Secondary | ICD-10-CM | POA: Diagnosis not present

## 2022-07-22 DIAGNOSIS — G4733 Obstructive sleep apnea (adult) (pediatric): Secondary | ICD-10-CM | POA: Diagnosis not present

## 2022-07-23 ENCOUNTER — Encounter: Payer: Self-pay | Admitting: Physician Assistant

## 2022-07-23 ENCOUNTER — Ambulatory Visit: Payer: BC Managed Care – PPO | Admitting: Physician Assistant

## 2022-07-23 ENCOUNTER — Other Ambulatory Visit: Payer: Self-pay | Admitting: Physician Assistant

## 2022-07-23 VITALS — BP 102/60 | HR 52 | Temp 98.2°F | Ht 64.17 in | Wt 250.6 lb

## 2022-07-23 DIAGNOSIS — K580 Irritable bowel syndrome with diarrhea: Secondary | ICD-10-CM

## 2022-07-23 DIAGNOSIS — R161 Splenomegaly, not elsewhere classified: Secondary | ICD-10-CM

## 2022-07-23 DIAGNOSIS — R001 Bradycardia, unspecified: Secondary | ICD-10-CM | POA: Diagnosis not present

## 2022-07-23 DIAGNOSIS — D649 Anemia, unspecified: Secondary | ICD-10-CM

## 2022-07-23 DIAGNOSIS — Z113 Encounter for screening for infections with a predominantly sexual mode of transmission: Secondary | ICD-10-CM

## 2022-07-23 DIAGNOSIS — R5383 Other fatigue: Secondary | ICD-10-CM | POA: Insufficient documentation

## 2022-07-23 DIAGNOSIS — R160 Hepatomegaly, not elsewhere classified: Secondary | ICD-10-CM

## 2022-07-23 NOTE — Patient Instructions (Addendum)
Welcome to [facility]! It was a pleasure meeting you today.  As discussed, Please schedule a LAB ONLY visit within the next week, ANDa 2 week follow-up visit with a provider.   I would really like to get an EKG on you at next visit.  If any symptoms of chest pain, passing out, severe weakness, etc, go to the emergency department.   PLEASE NOTE:  If you had any LAB tests please let us know if you have not heard back within a few days. You may see your results on MyChart before we have a chance to review them but we will give you a call once they are reviewed by [place]. If we ordered any REFERRALS today, please let us know if you have not heard from their office within the next two weeks. Let us know through MyChart if you are needing REFILLS, or have your pharmacy send [place] the request. You can also use MyChart to communicate with me or any office staff.  Please try these tips to maintain a healthy lifestyle:  Eat most of your calories during the day when you are active. Eliminate processed foods including packaged sweets (pies, cakes, cookies), reduce intake of potatoes, white bread, white pasta, and white rice. Look for whole grain options, oat flour or almond flour.  Each meal should contain half fruits/vegetables, one quarter protein, and one quarter carbs (no bigger than a computer mouse).  Cut down on sweet beverages. This includes juice, soda, and sweet tea. Also watch fruit intake, though this is a healthier sweet option, it still contains natural sugar! Limit to 3 servings daily.  Drink at least 1 glass of water with each meal and aim for at least 8 glasses (64 ounces) per day.  Exercise at least 150 minutes every week to the best of your ability.    Take Care,  Alexcia Schools, PA-C

## 2022-07-23 NOTE — Assessment & Plan Note (Signed)
I noted this on her exam today. She does not run or exercise at all. She tells me she sometimes feels palpitations, but ?had a zio patch last year that was normal. Does not take a beta blocker.   No EKG in the last 10 years, unable to perform today due to patient having to leave to go back to work. I would like to obtain an EKG at next visit. She may need to see a cardiologist.

## 2022-07-23 NOTE — Assessment & Plan Note (Addendum)
She was taking bentyl only as needed and this wasn't helping. She says IB guard not really helping. She has been to gastroenterology previously and says she's had colonoscopies that have been normal. She has a hx of cholecystectomy that may be contributing to symptoms. Lifestyle changes would help. Will continue dialogue with her on this at future appts.

## 2022-07-23 NOTE — Progress Notes (Signed)
Subjective:    Patient ID: Claudia Walsh, female    DOB: 1980-09-27, 42 y.o.   MRN: [id number]  Chief Complaint  Patient presents with   New Patient (Initial Visit)    Pt in office to establish care with PCP; last saw [facility] last month but not sure she wants to stay with PCP;     HPI 42 y.o. patient presents today for new patient establishment with me.  Patient was previously established with Doyce Para.  Current Care Team: Neurology - last visit yesterday    Concerns: Constantly tired, working with neurologist on possible narcolepsy   IBS - worsening, takes IB guard - more diarrhea based. No blood that she can see.   Needing labs updated   Past Medical History:  Diagnosis Date   ADD (attention deficit disorder)    Anemia "in my early 20's"   Anxiety    Asthma    Chronic bronchitis (HCC)    Cyclothymia    Depression    states off all meds   GERD (gastroesophageal reflux disease)    H/O hiatal hernia    IBS (irritable bowel syndrome)    Kidney stone    "passed them" 2009   migraine headache    "yearly" (06/12/2015)   Morbid obesity (HCC)    PCOS (polycystic ovarian syndrome)    Sleep apnea    cpap autoset   Small bowel obstruction [facility])     Past Surgical History:  Procedure Laterality Date   CESAREAN SECTION  01/21/2007   CHOLECYSTECTOMY  12/12/2011   Procedure: LAPAROSCOPIC CHOLECYSTECTOMY WITH INTRAOPERATIVE CHOLANGIOGRAM;  Surgeon: Wilmon Arms. Corliss Skains, MD;  Location: [facility];  Service: General;  Laterality: N/A;   CYSTOSCOPY WITH RETROGRADE PYELOGRAM, URETEROSCOPY AND STENT PLACEMENT Left 06/10/2022   Procedure: CYSTOSCOPY WITH RETROGRADE PYELOGRAM, URETEROSCOPY AND STENT PLACEMENT;  Surgeon: Crist Fat, MD;  Location: [facility];  Service: Urology;  Laterality: Left;   CYSTOSCOPY/URETEROSCOPY/HOLMIUM LASER/STENT PLACEMENT Left 06/19/2022   Procedure: CYSTOSCOPY, LEFT URETEROSCOPY, RETROGRADE PYELOGRAM, BASKET STONE EXTRACTION; STENT REMOVAL;   Surgeon: Crist Fat, MD;  Location: [facility];  Service: Urology;  Laterality: Left;   TYMPANOSTOMY TUBE PLACEMENT  ~ 1989   UPPER GASTROINTESTINAL ENDOSCOPY     WISDOM TOOTH EXTRACTION  01/21/1999    No family history on file.  Social History   Tobacco Use   Smoking status: Never   Smokeless tobacco: Never   Tobacco comments:    Smokes hookah few x month  Vaping Use   Vaping Use: Never used  Substance Use Topics   Alcohol use: Not Currently   Drug use: No     Allergies  Allergen Reactions   Aspirin Other (See Comments)    Nose bleeds Nose bleeds    Depo-Provera [Medroxyprogesterone]     Weight gain   Nuvaring [Etonogestrel-Ethinyl Estradiol]     Rash on face,mood swings   Ortho-Cyclen [Norgestimate-Eth Estradiol]     Review of Systems NEGATIVE UNLESS OTHERWISE INDICATED IN HPI      Objective:     BP 102/60 (BP Location: Left Arm)   Pulse (!) 52   Temp 98.2 F (36.8 C) (Temporal)   Ht 5' 4.17" (1.63 m)   Wt 250 lb 9.6 oz (113.7 kg)   LMP  (LMP Unknown)   SpO2 97%   BMI 42.78 kg/m   Wt Readings from Last 3 Encounters:  07/23/22 250 lb 9.6 oz (113.7 kg)  06/19/22 252 lb 8 oz (114.5 kg)  06/10/22 251 lb 6.4 oz (114 kg)    BP Readings from Last 3 Encounters:  07/23/22 102/60  06/19/22 123/74  06/10/22 109/71     Physical Exam Vitals reviewed.  Constitutional:      Appearance: Normal appearance. She is morbidly obese. She is not ill-appearing.  Cardiovascular:     Rate and Rhythm: Regular rhythm. Bradycardia present.     Heart sounds: No murmur heard. Pulmonary:     Effort: Pulmonary effort is normal.     Breath sounds: Normal breath sounds.  Musculoskeletal:     Right lower leg: No edema.     Left lower leg: No edema.  Neurological:     General: No focal deficit present.     Mental Status: She is alert and oriented to person, place, and time.  Psychiatric:        Mood and Affect: Mood normal.         Assessment & Plan:  Bradycardia Assessment & Plan: I noted this on her exam today. She does not run or exercise at all. She tells me she sometimes feels palpitations, but ?had a zio patch last year that was normal. Does not take a beta blocker.   No EKG in the last 10 years, unable to perform today due to patient having to leave to go back to work. I would like to obtain an EKG at next visit. She may need to see a cardiologist.    Splenomegaly  Hepatomegaly  Other fatigue -     Iron, TIBC and Ferritin Panel; Future -     VITAMIN D 25 Hydroxy (Vit-D Deficiency, Fractures); Future -     Vitamin B12; Future -     TSH; Future -     Comprehensive metabolic panel; Future -     CBC with Differential/Platelet; Future  Low hemoglobin -     Iron, TIBC and Ferritin Panel; Future -     CBC with Differential/Platelet; Future  Routine screening for STI (sexually transmitted infection) -     Urine cytology ancillary only; Future -     HIV Antibody (routine testing w rflx); Future -     RPR; Future  Irritable bowel syndrome with diarrhea Assessment & Plan: She was taking bentyl only as needed and this wasn't helping. She says IB guard not really helping. She has been to gastroenterology previously and says she's had colonoscopies that have been normal. She has a hx of cholecystectomy that may be contributing to symptoms. Lifestyle changes would help. Will continue dialogue with her on this at future appts.     CT renal stone study from 06/07/22: IMPRESSION: 1. 3 mm proximal left ureteral stone with mild obstructive uropathy. 2. Nonobstructive 1 mm solitary stone on the right. 3. Constipation and diverticulosis. 4. Small hiatal hernia.  Umbilical fat hernia. 5. Mild hepatic steatosis. 6. Mild splenomegaly.  -recommended repeat US to recheck on liver and spleen, pt agreeable, sent this order today.  She is going to schedule to come back for labs and a later office visit with me once she  is able to have more time free of work.   Time Spent: 55 minutes of total time was spent on the date of the encounter performing the following actions: chart review prior to seeing the patient, obtaining history, performing a medically necessary exam, counseling on the treatment plan, placing orders, and documenting in our EHR.       Jayzon Taras M Kazimierz Springborn, PA-C

## 2022-07-24 ENCOUNTER — Encounter: Payer: Self-pay | Admitting: Physician Assistant

## 2022-07-25 ENCOUNTER — Emergency Department (HOSPITAL_BASED_OUTPATIENT_CLINIC_OR_DEPARTMENT_OTHER)
Admission: EM | Admit: 2022-07-25 | Discharge: 2022-07-25 | Disposition: A | Discharge status: Home / Self Care | Payer: BC Managed Care – PPO | Attending: Emergency Medicine | Admitting: Emergency Medicine

## 2022-07-25 ENCOUNTER — Other Ambulatory Visit: Payer: Self-pay

## 2022-07-25 ENCOUNTER — Encounter (HOSPITAL_BASED_OUTPATIENT_CLINIC_OR_DEPARTMENT_OTHER): Payer: Self-pay

## 2022-07-25 ENCOUNTER — Telehealth: Payer: Self-pay | Admitting: Physician Assistant

## 2022-07-25 DIAGNOSIS — J45909 Unspecified asthma, uncomplicated: Secondary | ICD-10-CM | POA: Diagnosis not present

## 2022-07-25 DIAGNOSIS — E876 Hypokalemia: Secondary | ICD-10-CM | POA: Diagnosis not present

## 2022-07-25 DIAGNOSIS — R001 Bradycardia, unspecified: Secondary | ICD-10-CM | POA: Diagnosis not present

## 2022-07-25 DIAGNOSIS — R064 Hyperventilation: Secondary | ICD-10-CM

## 2022-07-25 DIAGNOSIS — R0602 Shortness of breath: Secondary | ICD-10-CM | POA: Diagnosis not present

## 2022-07-25 DIAGNOSIS — R457 State of emotional shock and stress, unspecified: Secondary | ICD-10-CM | POA: Diagnosis not present

## 2022-07-25 DIAGNOSIS — R42 Dizziness and giddiness: Secondary | ICD-10-CM

## 2022-07-25 LAB — CBC WITH DIFFERENTIAL/PLATELET
Abs Immature Granulocytes: 0.01 10*3/uL (ref 0.00–0.07)
Basophils Absolute: 0 10*3/uL (ref 0.0–0.1)
Basophils Relative: 0 %
Eosinophils Absolute: 0 10*3/uL (ref 0.0–0.5)
Eosinophils Relative: 1 %
HCT: 35.8 % — ABNORMAL LOW (ref 36.0–46.0)
Hemoglobin: 12.3 g/dL (ref 12.0–15.0)
Immature Granulocytes: 0 %
Lymphocytes Relative: 26 %
Lymphs Abs: 1.5 10*3/uL (ref 0.7–4.0)
MCH: 30.4 pg (ref 26.0–34.0)
MCHC: 34.4 g/dL (ref 30.0–36.0)
MCV: 88.4 fL (ref 80.0–100.0)
Monocytes Absolute: 0.3 10*3/uL (ref 0.1–1.0)
Monocytes Relative: 5 %
Neutro Abs: 4 10*3/uL (ref 1.7–7.7)
Neutrophils Relative %: 68 %
Platelets: 244 10*3/uL (ref 150–400)
RBC: 4.05 MIL/uL (ref 3.87–5.11)
RDW: 13.1 % (ref 11.5–15.5)
WBC: 5.9 10*3/uL (ref 4.0–10.5)
nRBC: 0 % (ref 0.0–0.2)

## 2022-07-25 LAB — I-STAT VENOUS BLOOD GAS, ED
Acid-base deficit: 3 mmol/L — ABNORMAL HIGH (ref 0.0–2.0)
Bicarbonate: 21 mmol/L (ref 20.0–28.0)
Calcium, Ion: 1.18 mmol/L (ref 1.15–1.40)
HCT: 36 % (ref 36.0–46.0)
Hemoglobin: 12.2 g/dL (ref 12.0–15.0)
O2 Saturation: 69 %
Patient temperature: 97.9
Potassium: 3.2 mmol/L — ABNORMAL LOW (ref 3.5–5.1)
Sodium: 142 mmol/L (ref 135–145)
TCO2: 22 mmol/L (ref 22–32)
pCO2, Ven: 32.4 mmHg — ABNORMAL LOW (ref 44–60)
pH, Ven: 7.418 (ref 7.25–7.43)
pO2, Ven: 34 mmHg (ref 32–45)

## 2022-07-25 LAB — BASIC METABOLIC PANEL
Anion gap: 8 (ref 5–15)
BUN: 13 mg/dL (ref 6–20)
CO2: 21 mmol/L — ABNORMAL LOW (ref 22–32)
Calcium: 8.7 mg/dL — ABNORMAL LOW (ref 8.9–10.3)
Chloride: 108 mmol/L (ref 98–111)
Creatinine, Ser: 0.57 mg/dL (ref 0.44–1.00)
GFR, Estimated: >60 mL/min (ref >60)
Glucose, Bld: 96 mg/dL (ref 70–99)
Potassium: 3 mmol/L — ABNORMAL LOW (ref 3.5–5.1)
Sodium: 137 mmol/L (ref 135–145)

## 2022-07-25 MED ORDER — POTASSIUM CHLORIDE 20 MEQ PO PACK
60.0000 meq | PACK | Freq: Two times a day (BID) | ORAL | Status: DC
  Administered 2022-07-25: 60 meq via ORAL
  Filled 2022-07-25: qty 3

## 2022-07-25 MED ORDER — POTASSIUM CHLORIDE CRYS ER 20 MEQ PO TBCR
40.0000 meq | EXTENDED_RELEASE_TABLET | Freq: Once | ORAL | Status: DC
  Filled 2022-07-25: qty 2

## 2022-07-25 MED ORDER — METHYLPREDNISOLONE 4 MG PO TBPK: ORAL_TABLET | ORAL | 0 refills | Status: DC

## 2022-07-25 NOTE — Telephone Encounter (Signed)
FYI: This call has been transferred to triage nurse: Access Nurse. Once the result note has been entered staff can address the message at that time.  Patient called in with the following symptoms:  Red Word: Asthma attack, chest tightness, and inhaler not working    Please advise at Mobile [phone] (mobile)  Message is routed to Provider Pool.

## 2022-07-25 NOTE — Telephone Encounter (Signed)
Noted and agreed, thank you. 

## 2022-07-25 NOTE — ED Notes (Signed)
ED Provider at bedside. 

## 2022-07-25 NOTE — Telephone Encounter (Signed)
Referral placed for patient.  

## 2022-07-25 NOTE — ED Triage Notes (Signed)
Patient has a hx of asthma. She stated she has been short of breath today. She took her inhaler. EMS gave an albuterol neb en route.

## 2022-07-25 NOTE — Telephone Encounter (Signed)
Access nurse, Deverene, called stating final outcome was for pt to Go to ED Now. States pt refused and informed her she couldn't leave work. I informed access nurse that I would call patient back once spoken to clinical head, Hasna. Hasna confirmed that pt will need to go to ED for treatment. I called pt but LVM due to no answer. In message I informed pt she needs to go to ED for proper treatment. Awaiting note.

## 2022-07-25 NOTE — Telephone Encounter (Signed)
Please see pt msg and advise 

## 2022-07-25 NOTE — Telephone Encounter (Signed)
Called pt and she was taken by ambulance to [facility]. Pt wanted to advise PCP she had asthma attack due to hyperventilation. Advised pt we would follow up on hospital notes and pt will need to schedule Hospital/ED follow up once discharged. Pt verbalized understanding

## 2022-07-25 NOTE — Discharge Instructions (Addendum)
Follow up with your primary care doctor regarding your low potassium and breathing problems. I have ordered a dose of medrol for breathing. Please check with your surgeon regarding use of this medication as it can cause gastritis and may be a problem with your history of gastric bypass. Get help right away if you have any new or worsening symptoms.

## 2022-07-25 NOTE — Telephone Encounter (Signed)
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 The patient has been having an asthma attack all morning, inhaler is not helping and she has chest tightness. Translation No Nurse Assessment Nurse: Lilian Kapur, RN, Deverene Date/Time (Eastern Time): 07/25/2022 11:30:11 AM Confirm and document reason for call. If symptomatic, describe symptoms. ---Caller states she has been experiencing an ongoing asthma attack and chest tightness. Caller is also lightheaded. Does the patient have any new or worsening symptoms? ---Yes Will a triage be completed? ---Yes Related visit to physician within the last 2 weeks? ---No Does the PT have any chronic conditions? (i.e. diabetes, asthma, this includes High risk factors for pregnancy, etc.) ---Yes List chronic conditions. ---asthma, gerd, pcos, ibs, mental health history Is the patient pregnant or possibly pregnant? (Ask all females between the ages of 51-55) ---No Is this a behavioral health or substance abuse call? ---No Guidelines Guideline Title Affirmed Question Affirmed Notes Nurse Date/Time (Eastern Time) Asthma Attack SEVERE asthma attack (e.g., struggling for each Mcdonald, RN, Deverene 07/25/2022  11:32:34 AM    Guidelines Guideline Title Affirmed Question Affirmed Notes Nurse Date/Time Lamount Cohen Time) breath, speaks in single words, loud wheezes, pulse >120) (RED Zone) Disp. Time Lamount Cohen Time) Disposition Final User 07/25/2022 11:29:01 AM Send to Urgent Queue Cherylynn Ridges 07/25/2022 11:36:34 AM Call EMS 911 Now Yes Mcdonald, RN, Deverene 07/25/2022 11:44:30 AM 911 Outcome Documentation Mcdonald, RN, Deverene Reason: Caller states that she will not call 911, she is at work and may go to [facility]. Caller educated on reason for care advice. I called the backline number and provided this information. Evlyn will call patient back, Final Disposition 07/25/2022 11:36:34 AM Call EMS 911 Now Yes Mcdonald, RN, Deverene Caller Disagree/Comply Disagree Caller Understands Yes PreDisposition InappropriateToAsk Care Advice Given Per Guideline CALL EMS 911 NOW: * Immediate medical attention is needed. You need to hang up and call 911 (or an ambulance). FIRST AID FOR ASTHMA ATTACK - USE QUICK-RELIEF INHALER: * Take 4 to 6 puffs of your quick-relief ALBUTEROL (SALBUTAMOL) INHALER right now, while you are waiting for the ambulance. Repeat every 10 to 20 minutes. FIRST AID FOR ASTHMA ATTACK - TAKE PREDNISONE BY MOUTH: * If you have this medicine available, take prednisone 40 to 60 mg by mouth now. CARE ADVICE given per Asthma Attack (Adult) guideline.   Comments User: Ivory Broad, RN Date/Time Lamount Cohen Time): 07/25/2022 11:36:20 AM Caller states that she will not call 911 because she is at work and cannot leave. Caller states that she may go to urgent care. Caller educated on reason for care advice. User: Ivory Broad, RN Date/Time Lamount Cohen Time): 07/25/2022 11:42:25 AM Called the backline at [phone] and spoke with Havlyn. I provided patient's name, DOB, reason for calling, outcome, and call back number. Havlyn stated that she will call the patient back now.

## 2022-07-25 NOTE — Telephone Encounter (Signed)
Please see note and advise  

## 2022-07-25 NOTE — ED Provider Notes (Signed)
[place] [facility] Provider Note   CSN: [id number] Arrival date & time: 07/25/22  1231     History  Chief Complaint  Patient presents with   Shortness of Breath    Claudia Walsh is a 42 y.o. female who presents for asthma and hyperventilation.  Patient reports that she has had this going on for years and that her pulmonologist said that it is a "chicken or the egg situation."  She states that she feels like her hyperventilation can be triggered by asthma or her asthma can trigger the hyperventilation.  She states that she is not nervous but it is caused by an imbalance of her blood gases.  Patient passed out at work today due to hyperventilation.  Is wheezing coughing or chest pain.  She used an albuterol treatment prior to arrival.   Shortness of Breath      Home Medications Prior to Admission medications   Medication Sig Start Date End Date Taking? Authorizing Provider  acetaminophen (TYLENOL) 500 MG tablet Take 500 mg by mouth every 6 (six) hours as needed for moderate pain or headache.    [provider]  acyclovir (ZOVIRAX) 400 MG tablet Take 400 mg by mouth daily as needed (cold sores).    [provider]  albuterol (PROVENTIL HFA;VENTOLIN HFA) 108 (90 BASE) MCG/ACT inhaler Inhale 2 puffs into the lungs every 6 (six) hours as needed for shortness of breath or wheezing. For shortness of breath.    [provider]  Armodafinil 200 MG TABS Take 1 tablet by mouth daily.    [provider]  baclofen (LIORESAL) 10 MG tablet Take 10 mg by mouth 2 (two) times daily as needed for muscle spasms.    [provider]  budesonide-formoterol (SYMBICORT) 160-4.5 MCG/ACT inhaler Inhale 2 puffs into the lungs daily.    [provider]  busPIRone (BUSPAR) 10 MG tablet Take 10 mg by mouth 2 (two) times daily. 08/11/20   [provider]  famotidine (PEPCID) 40 MG tablet Take 40 mg by mouth at  bedtime. 09/07/21   [provider]  gabapentin (NEURONTIN) 300 MG capsule Take 600 mg by mouth at bedtime. 08/17/20   [provider]  Ginkgo Biloba (GNP GINGKO BILOBA EXTRACT PO) Take 1 capsule by mouth 2 (two) times daily.    [provider]  hydrOXYzine (VISTARIL) 25 MG capsule Take 50 mg by mouth at bedtime. 05/30/22   [provider]  ibuprofen (ADVIL) 200 MG tablet Take 600 mg by mouth every 6 (six) hours as needed.    [provider]  levonorgestrel (MIRENA) 20 MCG/DAY IUD See admin instructions. 05/27/12   [provider]  magnesium 30 MG tablet Take 500 mg by mouth daily.    [provider]  Multiple Vitamins-Minerals (MULTIVITAMIN WOMEN PO) Take 1 tablet by mouth daily.    [provider]  NON FORMULARY Ibguard 1 tab 2 x day    [provider]  omeprazole (PRILOSEC) 40 MG capsule Take 40 mg by mouth 2 (two) times daily. 07/15/21   [provider]  ondansetron (ZOFRAN-ODT) 4 MG disintegrating tablet Take 1 tablet (4 mg total) by mouth every 8 (eight) hours as needed for nausea. 06/07/22   Garlon Hatchet, PA-C  Potassium 99 MG TABS Take 1 tablet by mouth daily.    [provider]  pyridOXINE (VITAMIN B6) 100 MG tablet Take 100 mg by mouth daily.    [provider]  triamcinolone (NASACORT) 55 MCG/ACT AERO nasal inhaler Place 1 spray into the nose daily. [facility] brand 11/14/21   [provider]  vitamin B-12 (CYANOCOBALAMIN) 100 MCG tablet Take 100 mcg by mouth daily.    [provider]  zonisamide (ZONEGRAN) 25 MG capsule Take 100 mg by mouth every evening.    [provider]      Allergies    Aspirin, Depo-provera [medroxyprogesterone], Nuvaring [etonogestrel-ethinyl estradiol], and Ortho-cyclen [norgestimate-eth estradiol]    Review of Systems   Review of Systems  Respiratory:  Positive for shortness of breath.     Physical Exam Updated Vital Signs BP  97/66   Pulse 61   Temp 97.8 F (36.6 C) (Oral)   Resp 20   Ht 5\' 4"  (1.626 m)   Wt 111.1 kg   LMP 07/13/2022 (Approximate)   SpO2 100%   BMI 42.05 kg/m  Physical Exam Vitals and nursing note reviewed.  Constitutional:      General: She is not in acute distress.    Appearance: She is well-developed. She is not diaphoretic.  HENT:     Head: Normocephalic and atraumatic.     Right Ear: External ear normal.     Left Ear: External ear normal.     Nose: Nose normal.     Mouth/Throat:     Mouth: Mucous membranes are moist.  Eyes:     General: No scleral icterus.    Conjunctiva/sclera: Conjunctivae normal.  Cardiovascular:     Rate and Rhythm: Normal rate and regular rhythm.     Heart sounds: Normal heart sounds. No murmur heard.    No friction rub. No gallop.  Pulmonary:     Effort: No respiratory distress.     Breath sounds: Normal breath sounds and air entry. No decreased air movement. No decreased breath sounds, wheezing, rhonchi or rales.     Comments: Patient hyperventilating Abdominal:     General: Bowel sounds are normal. There is no distension.     Palpations: Abdomen is soft. There is no mass.     Tenderness: There is no abdominal tenderness. There is no guarding.  Musculoskeletal:     Cervical back: Normal range of motion.     Right lower leg: No edema.     Left lower leg: No edema.  Skin:    General: Skin is warm and dry.  Neurological:     Mental Status: She is alert and oriented to person, place, and time.  Psychiatric:        Behavior: Behavior normal.     ED Results / Procedures / Treatments   Labs (all labs ordered are listed, but only abnormal results are displayed) Labs Reviewed - No data to display  EKG None  Radiology No results found.  Procedures Procedures    Medications Ordered in ED Medications - No data to display  ED Course/ Medical Decision Making/ A&P                             Medical Decision Making Amount and/or  Complexity of Data Reviewed Labs: ordered.  Risk Prescription drug management.   Patient here with complaint of shortness of breath and hyperventilation.  Patient states that she knows she passed out due to hyperventilating which has happened in the past.  She denies show patient or palpitations. She did not labs reviewed.  Her BMP shows potassium of 3.  I gave her oral repletion.  She states she is taking it at home and has had a history of the same.  She is also been using her albuterol recently.  She will need to follow-up with her primary care physician.  This could be contributing to fatigue. Her CBC is otherwise unremarkable. I have also reviewed a venous blood gas which shows CO2 of 32.4 with an increased acid base deficit representing compensated respiratory alkalosis.  I discussed these findings with the patient to tell her that she did not have any evidence of hypercapnia driving her respiratory effort.  That this appears to be pure hyperventilation.  She also has no evidence of wheezing or coughing suggestive of asthma attack or bronchospasm.  She has no edema in the lower extremity suggestive of pulmonary edema.  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 I do not think she has any emergency cause of her symptoms today and we will have her follow-up with her primary care physician for further evaluation and management.        Final Clinical Impression(s) / ED Diagnoses Final diagnoses:  Hypokalemia  Hyperventilation    Rx / DC Orders ED Discharge Orders     None         Arthor Captain, PA-C 07/25/22 1829    Terrilee Files, MD 07/26/22 1012

## 2022-07-28 ENCOUNTER — Ambulatory Visit (INDEPENDENT_AMBULATORY_CARE_PROVIDER_SITE_OTHER): Payer: BC Managed Care – PPO | Admitting: Internal Medicine

## 2022-07-28 ENCOUNTER — Encounter: Payer: Self-pay | Admitting: Internal Medicine

## 2022-07-28 VITALS — BP 100/80 | HR 64 | Temp 98.2°F | Resp 12

## 2022-07-28 DIAGNOSIS — J387 Other diseases of larynx: Secondary | ICD-10-CM | POA: Diagnosis not present

## 2022-07-28 DIAGNOSIS — M791 Myalgia, unspecified site: Secondary | ICD-10-CM | POA: Diagnosis not present

## 2022-07-28 DIAGNOSIS — F39 Unspecified mood [affective] disorder: Secondary | ICD-10-CM | POA: Diagnosis not present

## 2022-07-28 DIAGNOSIS — J3089 Other allergic rhinitis: Secondary | ICD-10-CM

## 2022-07-28 DIAGNOSIS — K219 Gastro-esophageal reflux disease without esophagitis: Secondary | ICD-10-CM

## 2022-07-28 DIAGNOSIS — L2084 Intrinsic (allergic) eczema: Secondary | ICD-10-CM

## 2022-07-28 DIAGNOSIS — J4540 Moderate persistent asthma, uncomplicated: Secondary | ICD-10-CM

## 2022-07-28 DIAGNOSIS — M542 Cervicalgia: Secondary | ICD-10-CM | POA: Diagnosis not present

## 2022-07-28 DIAGNOSIS — G43719 Chronic migraine without aura, intractable, without status migrainosus: Secondary | ICD-10-CM | POA: Diagnosis not present

## 2022-07-28 DIAGNOSIS — K580 Irritable bowel syndrome with diarrhea: Secondary | ICD-10-CM

## 2022-07-28 DIAGNOSIS — G518 Other disorders of facial nerve: Secondary | ICD-10-CM | POA: Diagnosis not present

## 2022-07-28 MED ORDER — PANTOPRAZOLE SODIUM 40 MG PO TBEC: 40.0000 mg | DELAYED_RELEASE_TABLET | Freq: Two times a day (BID) | ORAL | 3 refills | Status: DC

## 2022-07-28 NOTE — Patient Instructions (Addendum)
Reactive airway disease, moderate persistent, complicated by VCD Today's spirometry was normal  Daily controller medication(s): Continue  Symbicort 2 puffs twice a day with spacer and rinse mouth afterwards. May use albuterol rescue inhaler 2 puffs every 4 to 6 hours as needed for shortness of breath, chest tightness, coughing, and wheezing. May use albuterol rescue inhaler 2 puffs 5 to 15 minutes prior to strenuous physical activities. Monitor frequency of use.   Other atopic dermatitis Some eczema patches on foot  Continue basic skin care Try Eucrisa ointment twice day   Other allergic rhinitis Allergy testing a few years ago was positive to dust mites, trees, mold per patient report. Continue Hydroxyzine nightly and Singulair 10mg  nightly to control symptoms  Continue avoidance measures   Heartburn Continue heartburn lifestyle modifications. We will change your meds to protonix 40mg  twice daily   VCD  - start VCD exercises as below  - We will refer you to Dr. Julio Sicks VCD clinic at [facility]   Vocal Cord Dysfunction (VCD) /Inspiratory Laryngeal Obstruction (ILO)/ Exercise- Induced Laryngeal Obstruction Exercises (EILO)  Practice these techniques several times a day, so that when you need them, they will be automatic, and will work right away (or very fast) to open up the spasming (closed) vocal cords.  PREPARATION: ? Loosen clothing at waist, so nothing is tight or snug. Open top buttons of pants or skirt, unzip pant or skirt zippers, pull shirt out over pants or skirt. This prevents pressure on the stomach, which can cause stomach reflux (backup of corrosive liquid) into the esophagus. Gastric reflux is a frequent cause of VCD attacks. ? Sit in a comfortable chair. When you need to use these techniques, you may be standing, lying, or sitting-BUT first try to learn them while sitting because they are easier to learn in this position. ? Keep water handy. Take sips, so your  mouth and throat will not dry out. Swallow carefully, to avoid choking. ? Put your right (or left) thumb on your belly button, with the rest of your fingers below the thumb, so you are GENTLY touching your belly (lower abdomen). ? Doing this will focus your attention on your lower abdominal muscles. ? Relax your chest. Relax your shoulders. Relax your neck. Relax your throat. This helps you to try to use ONLY your belly (lower abdomen) muscles. Consciously try NOT to use chest muscles, etc. Chest muscle use seems to irritate vocal cords while "belly" muscles tend to relax them.  BREATHE OUT (EXHALE) FIRST WITH slightly PURSED LIPS: ? Since most people cannot inhale, (breathe in), during VCD attacks, please start by breathing out (exhaling) in the special way described below, and this will usually open up the vocal cords, immediately. ? Start, by breathing out (exhaling) with lips "pursed" as if you are trying to gently blow out a candle. ? This is similar to whistling, but your lips will not be as "puckered' as when whistling and your lips will not be pushed forward, like when whistling. If you look in a mirror, you would see a mostly horizontal line of space between the lips, while you exhale the 'ffffffffffffffffffff'. ? This may be silent, or may sound like a gentle wind or breeze, like 'fffffffffffffff' and your lips should be symmetrical, around your teeth. You lower lip will not be touching your upper teeth, like when someone says the name FFFFFFFRank. This is one continuous flow of exhaled air, either silent, or making a slightly windy sound. ? Feel your hand  on your belly (lower abdomen) come IN, a little bit toward your back, as you are 'working' only your lower belly muscles. ? This abdominal exhaled 'fffffffffffffffff' alone often stops VCD attacks very quickly.  ? Some prefer to try a variation of exhaling 'ffffffffffff'. Try exhaling quickly, 'ffff', 'fffff', 'ffff'--all part of one  exhalation. Do not inhale in between quick "fff's. This helps some people more quickly open up the spasming vocal cords. This is like blowing out a slightly stubborn candle, exhaling against a tiny bit of resistance (like trying to blow out a trick birthday candle). ? If any of this helps, try to slow down the exhalations, and gently exhale 'ffffffffffffffffff'. ? Some people prefer to exhale gently 'ssssssssssssssss' or 'shhhhhhhhhhhhh' rather than 'fffffffffffffff', etc. Choose what works best in your case, or what is most comfortable for you. ? You may need to repeat exhaling 'ffffffffffffff' (or 'ffff', 'ffff', 'ffff'), etc, several times to relax the spasming vocal cords. Repeating this often stops a VCD attack so that you can inhale (breathe in) again.

## 2022-07-28 NOTE — Progress Notes (Signed)
Follow Up Note  RE: Claudia Walsh MRN: [id number] DOB: 11-08-1980 Date of Office Visit: 07/28/2022  Referring provider: Allwardt, Crist Infante, PA-C Primary care provider: Allwardt, Crist Infante, PA-C  Chief Complaint: Asthma (Asthma has been doing good except this past week. She has been having a difficult catching her breath this past week.)  History of Present Illness: I had the pleasure of seeing Claudia Walsh for a follow up visit at the [facility] on 08/05/2022. She is a 42 y.o. female, who is being followed for asthma, rhinitis, GERD, rashes, atopic dermatitis . Her previous allergy office visit was on 11/11/21 with Dr. Marlynn Perking. Today is a regular follow up visit.  History obtained from patient, chart review.  Patient has a history of hyperventilation and asthma.  She reports over the last week she has had increased difficulty breathing.  Feels like breath gets cut off and she cannot get a deep breath.  This is led to hyperventilation and she presented to the ER on 07/25/2022 due to dizziness, tingling in her hands and concern for passing out.  Overall blood gas was consistent with hyperventilation.  No wheezing, crackles noted on exam.  Her potassium was low and she received oral repletion.  There is no concern for asthma flare or bronchospasm.  She is continue to have symptoms over the past few days is very concerned that she is in a pass out.  She has some associated hoarseness.  She practices breathing exercises.  Hyperventilation which normally helps but is no longer working.   Assessment and Plan: Claudia Walsh is a 42 y.o. female with: Inducible laryngeal obstruction (ILO)  Moderate persistent asthma without complication - Plan: Spirometry with Graph  Other allergic rhinitis  Intrinsic atopic dermatitis  Gastroesophageal reflux disease without esophagitis  Irritable bowel syndrome with diarrhea Plan: Patient Instructions  Reactive airway disease,  moderate persistent, complicated by VCD Today's spirometry was normal  Daily controller medication(s): Continue  Symbicort 2 puffs twice a day with spacer and rinse mouth afterwards. May use albuterol rescue inhaler 2 puffs every 4 to 6 hours as needed for shortness of breath, chest tightness, coughing, and wheezing. May use albuterol rescue inhaler 2 puffs 5 to 15 minutes prior to strenuous physical activities. Monitor frequency of use.   Other atopic dermatitis Some eczema patches on foot  Continue basic skin care Try Eucrisa ointment twice day   Other allergic rhinitis Allergy testing a few years ago was positive to dust mites, trees, mold per patient report. Continue Hydroxyzine nightly and Singulair 10mg  nightly to control symptoms  Continue avoidance measures   Heartburn Continue heartburn lifestyle modifications. We will change your meds to protonix 40mg  twice daily   VCD  - start VCD exercises as below  - We will refer you to Dr. Julio Sicks VCD clinic at [facility]   Vocal Cord Dysfunction (VCD) /Inspiratory Laryngeal Obstruction (ILO)/ Exercise- Induced Laryngeal Obstruction Exercises (EILO)  Practice these techniques several times a day, so that when you need them, they will be automatic, and will work right away (or very fast) to open up the spasming (closed) vocal cords.  PREPARATION: ? Loosen clothing at waist, so nothing is tight or snug. Open top buttons of pants or skirt, unzip pant or skirt zippers, pull shirt out over pants or skirt. This prevents pressure on the stomach, which can cause stomach reflux (backup of corrosive liquid) into the esophagus. Gastric reflux is a frequent cause of VCD attacks. ? Sit  in a comfortable chair. When you need to use these techniques, you may be standing, lying, or sitting-BUT first try to learn them while sitting because they are easier to learn in this position. ? Keep water handy. Take sips, so your mouth and throat will not  dry out. Swallow carefully, to avoid choking. ? Put your right (or left) thumb on your belly button, with the rest of your fingers below the thumb, so you are GENTLY touching your belly (lower abdomen). ? Doing this will focus your attention on your lower abdominal muscles. ? Relax your chest. Relax your shoulders. Relax your neck. Relax your throat. This helps you to try to use ONLY your belly (lower abdomen) muscles. Consciously try NOT to use chest muscles, etc. Chest muscle use seems to irritate vocal cords while "belly" muscles tend to relax them.  BREATHE OUT (EXHALE) FIRST WITH slightly PURSED LIPS: ? Since most people cannot inhale, (breathe in), during VCD attacks, please start by breathing out (exhaling) in the special way described below, and this will usually open up the vocal cords, immediately. ? Start, by breathing out (exhaling) with lips "pursed" as if you are trying to gently blow out a candle. ? This is similar to whistling, but your lips will not be as "puckered' as when whistling and your lips will not be pushed forward, like when whistling. If you look in a mirror, you would see a mostly horizontal line of space between the lips, while you exhale the 'ffffffffffffffffffff'. ? This may be silent, or may sound like a gentle wind or breeze, like 'fffffffffffffff' and your lips should be symmetrical, around your teeth. You lower lip will not be touching your upper teeth, like when someone says the name FFFFFFFRank. This is one continuous flow of exhaled air, either silent, or making a slightly windy sound. ? Feel your hand on your belly (lower abdomen) come IN, a little bit toward your back, as you are 'working' only your lower belly muscles. ? This abdominal exhaled 'fffffffffffffffff' alone often stops VCD attacks very quickly.  ? Some prefer to try a variation of exhaling 'ffffffffffff'. Try exhaling quickly, 'ffff', 'fffff', 'ffff'--all part of one exhalation. Do not inhale  in between quick "fff's. This helps some people more quickly open up the spasming vocal cords. This is like blowing out a slightly stubborn candle, exhaling against a tiny bit of resistance (like trying to blow out a trick birthday candle). ? If any of this helps, try to slow down the exhalations, and gently exhale 'ffffffffffffffffff'. ? Some people prefer to exhale gently 'ssssssssssssssss' or 'shhhhhhhhhhhhh' rather than 'fffffffffffffff', etc. Choose what works best in your case, or what is most comfortable for you. ? You may need to repeat exhaling 'ffffffffffffff' (or 'ffff', 'ffff', 'ffff'), etc, several times to relax the spasming vocal cords. Repeating this often stops a VCD attack so that you can inhale (breathe in) again.  No follow-ups on file.  Meds ordered this encounter  Medications   pantoprazole (PROTONIX) 40 MG tablet    Sig: Take 1 tablet (40 mg total) by mouth 2 (two) times daily.    Dispense:  60 tablet    Refill:  3    Lab Orders  No laboratory test(s) ordered today   Diagnostics: Spirometry:  Tracings reviewed. Her effort: Good reproducible efforts. FVC: 3.89 L FEV1: 3.47 L, 115% predicted FEV1/FVC ratio: 88% Interpretation: Spirometry consistent with normal pattern.  Please see scanned spirometry results for details.   Results interpreted  by myself during this encounter and discussed with patient/family.   Medication List:  Current Outpatient Medications  Medication Sig Dispense Refill   acetaminophen (TYLENOL) 500 MG tablet Take 500 mg by mouth every 6 (six) hours as needed for moderate pain or headache.     acyclovir (ZOVIRAX) 400 MG tablet Take 400 mg by mouth daily as needed (cold sores).     albuterol (PROVENTIL HFA;VENTOLIN HFA) 108 (90 BASE) MCG/ACT inhaler Inhale 2 puffs into the lungs every 6 (six) hours as needed for shortness of breath or wheezing. For shortness of breath.     Armodafinil 200 MG TABS Take 1 tablet by mouth daily.      baclofen (LIORESAL) 10 MG tablet Take 10 mg by mouth 2 (two) times daily as needed for muscle spasms.     budesonide-formoterol (SYMBICORT) 160-4.5 MCG/ACT inhaler Inhale 2 puffs into the lungs daily.     busPIRone (BUSPAR) 10 MG tablet Take 10 mg by mouth 2 (two) times daily.     famotidine (PEPCID) 40 MG tablet Take 40 mg by mouth at bedtime.     gabapentin (NEURONTIN) 300 MG capsule Take 600 mg by mouth at bedtime.     Ginkgo Biloba (GNP GINGKO BILOBA EXTRACT PO) Take 1 capsule by mouth 2 (two) times daily.     hydrOXYzine (VISTARIL) 25 MG capsule Take 50 mg by mouth at bedtime.     ibuprofen (ADVIL) 200 MG tablet Take 600 mg by mouth every 6 (six) hours as needed.     levonorgestrel (MIRENA) 20 MCG/DAY IUD See admin instructions.     magnesium 30 MG tablet Take 500 mg by mouth daily.     methylPREDNISolone (MEDROL DOSEPAK) 4 MG TBPK tablet Use as directed 21 tablet 0   Multiple Vitamins-Minerals (MULTIVITAMIN WOMEN PO) Take 1 tablet by mouth daily.     NON FORMULARY Ibguard 1 tab 2 x day     ondansetron (ZOFRAN-ODT) 4 MG disintegrating tablet Take 1 tablet (4 mg total) by mouth every 8 (eight) hours as needed for nausea. 10 tablet 0   pantoprazole (PROTONIX) 40 MG tablet Take 1 tablet (40 mg total) by mouth 2 (two) times daily. 60 tablet 3   Potassium 99 MG TABS Take 1 tablet by mouth daily.     pyridOXINE (VITAMIN B6) 100 MG tablet Take 100 mg by mouth daily.     triamcinolone (NASACORT) 55 MCG/ACT AERO nasal inhaler Place 1 spray into the nose daily. [facility] brand     vitamin B-12 (CYANOCOBALAMIN) 100 MCG tablet Take 100 mcg by mouth daily.     zonisamide (ZONEGRAN) 25 MG capsule Take 100 mg by mouth every evening.     No current facility-administered medications for this visit.   Allergies: Allergies  Allergen Reactions   Aspirin Other (See Comments)    Nose bleeds Nose bleeds    Depo-Provera [Medroxyprogesterone]     Weight gain   Nuvaring [Etonogestrel-Ethinyl Estradiol]      Rash on face,mood swings   Ortho-Cyclen [Norgestimate-Eth Estradiol]    I reviewed her past medical history, social history, family history, and environmental history and no significant changes have been reported from her previous visit.  ROS: All others negative except as noted per HPI.   Objective: BP 100/80 (BP Location: Left Wrist, Patient Position: Sitting, Cuff Size: Normal)   Pulse 64   Temp 98.2 F (36.8 C) (Temporal)   Resp 12   LMP 07/13/2022 (Approximate)   SpO2 98%  There is no  height or weight on file to calculate BMI. General Appearance:  Alert, cooperative, no distress, appears stated age  Head:  Normocephalic, without obvious abnormality, atraumatic  Eyes:  Conjunctiva clear, EOM's intact  Nose: Nares normal, normal mucosa, no visible anterior polyps, and septum midline  Throat: Lips, tongue normal; teeth and gums normal, normal posterior oropharynx and no tonsillar exudate  Neck: Supple, symmetrical  Lungs:   clear to auscultation bilaterally, Respirations unlabored, no coughing  Heart:  regular rate and rhythm and no murmur, Appears well perfused  Extremities: No edema  Skin: Skin color, texture, turgor normal, no rashes or lesions on visualized portions of skin "quarter sized dry patch on the bottom of left foot  Neurologic: No gross deficits   Previous notes and tests were reviewed. The plan was reviewed with the patient/family, and all questions/concerned were addressed.  It was my pleasure to see Claudia Walsh today and participate in her care. Please feel free to contact me with any questions or concerns.  Sincerely,  Ferol Luz, MD  Allergy & Immunology  [facility] Office: [phone]

## 2022-08-05 ENCOUNTER — Telehealth: Payer: Self-pay | Admitting: Physician Assistant

## 2022-08-05 ENCOUNTER — Telehealth: Payer: Self-pay

## 2022-08-05 ENCOUNTER — Other Ambulatory Visit (HOSPITAL_COMMUNITY)
Admission: RE | Admit: 2022-08-05 | Discharge: 2022-08-05 | Disposition: A | Discharge status: Home / Self Care | Payer: BC Managed Care – PPO | Source: Ambulatory Visit | Attending: Physician Assistant | Admitting: Physician Assistant

## 2022-08-05 ENCOUNTER — Other Ambulatory Visit (INDEPENDENT_AMBULATORY_CARE_PROVIDER_SITE_OTHER): Payer: BC Managed Care – PPO

## 2022-08-05 ENCOUNTER — Encounter: Payer: Self-pay | Admitting: Physician Assistant

## 2022-08-05 DIAGNOSIS — Z113 Encounter for screening for infections with a predominantly sexual mode of transmission: Secondary | ICD-10-CM | POA: Diagnosis not present

## 2022-08-05 DIAGNOSIS — R5383 Other fatigue: Secondary | ICD-10-CM | POA: Diagnosis not present

## 2022-08-05 DIAGNOSIS — G4733 Obstructive sleep apnea (adult) (pediatric): Secondary | ICD-10-CM | POA: Diagnosis not present

## 2022-08-05 DIAGNOSIS — D649 Anemia, unspecified: Secondary | ICD-10-CM

## 2022-08-05 MED ORDER — MONTELUKAST SODIUM 10 MG PO TABS: 10.0000 mg | ORAL_TABLET | Freq: Every day | ORAL | 5 refills | Status: DC

## 2022-08-05 NOTE — Addendum Note (Signed)
Addended by: Berna Bue on: 08/05/2022 02:57 PM   Modules accepted: Orders

## 2022-08-05 NOTE — Telephone Encounter (Signed)
Patient called requesting a refill on Singulair. [facility]

## 2022-08-05 NOTE — Telephone Encounter (Signed)
Note is in.

## 2022-08-05 NOTE — Telephone Encounter (Signed)
Sent in montelukast to [facility]

## 2022-08-05 NOTE — Telephone Encounter (Signed)
I dont see montelukast listed on last AVS advise to refill on this please

## 2022-08-05 NOTE — Telephone Encounter (Signed)
Patient states she is set to have US abdomen complete done tomorrow. States [facility] wanted her to have a renal scan but agreed that the US abdomen complete would suffice. Imaging informed her that a disc would need to be order by PCP to be able to send to [facility].

## 2022-08-05 NOTE — Telephone Encounter (Signed)
We can refill Singulair 10mg  daily.  Thanks!

## 2022-08-05 NOTE — Telephone Encounter (Signed)
Please advise 

## 2022-08-05 NOTE — Telephone Encounter (Signed)
Patient called stating she was supposed to be referred to a provider for VCD. I checked Dr. Marlynn Perking last note and it states the following:  VCD  - start VCD exercises as below  - We will refer you to Dr. Julio Sicks VCD clinic at [facility]    Vocal Cord Dysfunction (VCD) /Inspiratory Laryngeal Obstruction (ILO)/ Exercise- Induced Laryngeal Obstruction Exercises (EILO)   Dr Marlynn Perking can you let me know when your done with your note so I can fax the referral over?   Patient is requesting that their office call her to get scheduled and me not schedule her due to her work schedule. I called and left their office a voicemail to call me back regarding the referral being placed.     Patient Line [phone] Fax: [phone]

## 2022-08-06 ENCOUNTER — Ambulatory Visit
Admission: RE | Admit: 2022-08-06 | Discharge: 2022-08-06 | Disposition: A | Discharge status: Home / Self Care | Payer: BC Managed Care – PPO | Source: Ambulatory Visit | Attending: Physician Assistant | Admitting: Physician Assistant

## 2022-08-06 DIAGNOSIS — R161 Splenomegaly, not elsewhere classified: Secondary | ICD-10-CM

## 2022-08-06 DIAGNOSIS — R160 Hepatomegaly, not elsewhere classified: Secondary | ICD-10-CM

## 2022-08-06 LAB — COMPREHENSIVE METABOLIC PANEL
ALT: 29 U/L (ref 0–35)
AST: 19 U/L (ref 0–37)
Albumin: 4.2 g/dL (ref 3.5–5.2)
Alkaline Phosphatase: 140 U/L — ABNORMAL HIGH (ref 39–117)
BUN: 16 mg/dL (ref 6–23)
CO2: 25 mEq/L (ref 19–32)
Calcium: 8.9 mg/dL (ref 8.4–10.5)
Chloride: 106 mEq/L (ref 96–112)
Creatinine, Ser: 0.67 mg/dL (ref 0.40–1.20)
GFR: 109.91 mL/min (ref >60.00)
Glucose, Bld: 92 mg/dL (ref 70–99)
Potassium: 3.8 mEq/L (ref 3.5–5.1)
Sodium: 139 mEq/L (ref 135–145)
Total Bilirubin: 0.2 mg/dL (ref 0.2–1.2)
Total Protein: 6.9 g/dL (ref 6.0–8.3)

## 2022-08-06 LAB — CBC WITH DIFFERENTIAL/PLATELET
Basophils Absolute: 0.1 10*3/uL (ref 0.0–0.1)
Basophils Relative: 1.3 % (ref 0.0–3.0)
Eosinophils Absolute: 0.1 10*3/uL (ref 0.0–0.7)
Eosinophils Relative: 1.3 % (ref 0.0–5.0)
HCT: 37.2 % (ref 36.0–46.0)
Hemoglobin: 12.5 g/dL (ref 12.0–15.0)
Lymphocytes Relative: 23.9 % (ref 12.0–46.0)
Lymphs Abs: 2.1 10*3/uL (ref 0.7–4.0)
MCHC: 33.6 g/dL (ref 30.0–36.0)
MCV: 90.9 fl (ref 78.0–100.0)
Monocytes Absolute: 0.4 10*3/uL (ref 0.1–1.0)
Monocytes Relative: 5.1 % (ref 3.0–12.0)
Neutro Abs: 6 10*3/uL (ref 1.4–7.7)
Neutrophils Relative %: 68.4 % (ref 43.0–77.0)
Platelets: 256 10*3/uL (ref 150.0–400.0)
RBC: 4.09 Mil/uL (ref 3.87–5.11)
RDW: 13.4 % (ref 11.5–15.5)
WBC: 8.7 10*3/uL (ref 4.0–10.5)

## 2022-08-06 LAB — HIV ANTIBODY (ROUTINE TESTING W REFLEX): HIV 1&2 Ab, 4th Generation: NONREACTIVE

## 2022-08-06 LAB — RPR: RPR Ser Ql: NONREACTIVE

## 2022-08-06 LAB — IRON,TIBC AND FERRITIN PANEL
%SAT: 19 % (calc) (ref 16–45)
Ferritin: 61 ng/mL (ref 16–154)
Iron: 60 ug/dL (ref 40–190)
TIBC: 309 mcg/dL (calc) (ref 250–450)

## 2022-08-06 LAB — TSH: TSH: 3.01 u[IU]/mL (ref 0.35–5.50)

## 2022-08-06 LAB — VITAMIN B12: Vitamin B-12: >1501 pg/mL — ABNORMAL HIGH (ref 211–911)

## 2022-08-06 LAB — VITAMIN D 25 HYDROXY (VIT D DEFICIENCY, FRACTURES): VITD: 38.87 ng/mL (ref 30.00–100.00)

## 2022-08-07 ENCOUNTER — Encounter: Payer: Self-pay | Admitting: Physician Assistant

## 2022-08-07 LAB — URINE CYTOLOGY ANCILLARY ONLY
Chlamydia: NEGATIVE
Comment: NEGATIVE
Comment: NEGATIVE
Comment: NORMAL
Neisseria Gonorrhea: NEGATIVE
Trichomonas: NEGATIVE

## 2022-08-08 ENCOUNTER — Other Ambulatory Visit: Payer: BC Managed Care – PPO

## 2022-08-11 ENCOUNTER — Other Ambulatory Visit: Payer: Self-pay

## 2022-08-11 DIAGNOSIS — R748 Abnormal levels of other serum enzymes: Secondary | ICD-10-CM

## 2022-08-11 DIAGNOSIS — F39 Unspecified mood [affective] disorder: Secondary | ICD-10-CM | POA: Diagnosis not present

## 2022-08-13 ENCOUNTER — Telehealth: Payer: Self-pay | Admitting: Internal Medicine

## 2022-08-13 ENCOUNTER — Encounter: Payer: Self-pay | Admitting: Physician Assistant

## 2022-08-13 NOTE — Telephone Encounter (Signed)
Please see the telephone contact dated 08/05/2022-Referral.   Thanks

## 2022-08-13 NOTE — Telephone Encounter (Signed)
Patient called stating that previous forms faxed on 7/18 are not the correct forms. Correct forms are listed below in [employer] message and are due in 2 days.

## 2022-08-13 NOTE — Telephone Encounter (Addendum)
Printed forms from [employer] and placed in provider folder to review and sign. Patient was also called and made aware.

## 2022-08-13 NOTE — Telephone Encounter (Signed)
Patient called and she stated she faxed over some FMLA paperwork and wanted to know if it was done and as well she stated she was referred to a place and wanted a call back on that as well, call back number [phone].

## 2022-08-13 NOTE — Telephone Encounter (Signed)
Spoke with patient that her fmla forms will be ready for pick up on Monday. Patient will call her place of employment to extend the return of her fmla paper work.

## 2022-08-14 ENCOUNTER — Telehealth: Payer: Self-pay | Admitting: Physician Assistant

## 2022-08-14 NOTE — Telephone Encounter (Signed)
Patient dropped off document FMLA, to be filled out by provider. Patient requested to send it back via Fax within 5-days. Document is located in providers tray at front office.Please advise at [facility] [phone]

## 2022-08-14 NOTE — Telephone Encounter (Signed)
The FMLA have been fill and placed on Dr. Marlynn Perking desk to be signed on Monday.

## 2022-08-18 ENCOUNTER — Telehealth: Payer: Self-pay

## 2022-08-18 DIAGNOSIS — Z0279 Encounter for issue of other medical certificate: Secondary | ICD-10-CM

## 2022-08-18 NOTE — Telephone Encounter (Signed)
Faxed updated FMLA paperwork to [place] for [employer].

## 2022-08-25 ENCOUNTER — Telehealth: Payer: Self-pay | Admitting: Internal Medicine

## 2022-08-25 NOTE — Telephone Encounter (Signed)
Spoke with pt she is bringing forms back in for [place] to look over to correct what needs to be corrected

## 2022-08-25 NOTE — Telephone Encounter (Signed)
Patient called and stated she is having issues with her FMLA paperwork and being denied. Call back number [phone].

## 2022-08-28 DIAGNOSIS — F39 Unspecified mood [affective] disorder: Secondary | ICD-10-CM | POA: Diagnosis not present

## 2022-09-05 DIAGNOSIS — G4733 Obstructive sleep apnea (adult) (pediatric): Secondary | ICD-10-CM | POA: Diagnosis not present

## 2022-09-22 DIAGNOSIS — F39 Unspecified mood [affective] disorder: Secondary | ICD-10-CM | POA: Diagnosis not present

## 2022-09-24 DIAGNOSIS — M542 Cervicalgia: Secondary | ICD-10-CM | POA: Diagnosis not present

## 2022-09-24 DIAGNOSIS — M791 Myalgia, unspecified site: Secondary | ICD-10-CM | POA: Diagnosis not present

## 2022-09-24 DIAGNOSIS — G43719 Chronic migraine without aura, intractable, without status migrainosus: Secondary | ICD-10-CM | POA: Diagnosis not present

## 2022-09-24 DIAGNOSIS — G518 Other disorders of facial nerve: Secondary | ICD-10-CM | POA: Diagnosis not present

## 2022-10-06 DIAGNOSIS — G4733 Obstructive sleep apnea (adult) (pediatric): Secondary | ICD-10-CM | POA: Diagnosis not present

## 2022-10-06 DIAGNOSIS — F39 Unspecified mood [affective] disorder: Secondary | ICD-10-CM | POA: Diagnosis not present

## 2022-10-09 NOTE — Progress Notes (Addendum)
Cardiology Office Note:   Date:  10/10/2022  ID:  Claudia Walsh, DOB [birth year], MRN [id number] PCP:  Allwardt, Crist Infante, PA-C  [facility] Providers Cardiologist:  Alverda Skeans, MD Referring MD: Allwardt, Crist Infante, PA-C   Chief Complaint/Reason for Referral: Bradycardia ASSESSMENT:    1. Bradycardia   2. BMI 40.0-44.9, adult (HCC)     PLAN:   In order of problems listed above: 1.  Bradycardia: This is mild and not contributing to her fatigue.  Mild bradycardia in a young person is not necessarily unusual, however she is on many medications.  Of her medications, both Buspar and baclofen are associated with bradycardia.  Her fatigue is likely due to sleep disturbance/sleep apnea, and polypharmacy.  Her lightheadedness is likely due to medication related side effects as at least 3 of her medications are associated with lightheadedness; she had an echocardiogram last year with normal LV function no significant valvular abnormalities.  I will refer her for a monitor to evaluate for worrisome arrhythmias. 2.  Elevated BMI: Per primary care provider.      Dispo:  Return if symptoms worsen or fail to improve.    Medication Adjustments/Labs and Tests Ordered: Current medicines are reviewed at length with the patient today.  Concerns regarding medicines are outlined above.  The following changes have been made:  no change   Labs/tests ordered: Orders Placed This Encounter  Procedures   LONG TERM MONITOR (3-14 DAYS)   EKG 12-Lead    Medication Changes: No orders of the defined types were placed in this encounter.   Current medicines are reviewed at length with the patient today.  The patient does not have concerns regarding medicines.  History of Present Illness:   FOCUSED PROBLEM LIST:   BMI of 43 status post gastric bypass surgery OSA intolerant of CPAP  September 2024: The patient is a 42 y.o. female with the indicated medical history here for recommendations  regarding bradycardia.  The patient was seen by her primary care provider in early July with a pulse of 52.    She also noted some palpitations.  She was seen by allergy and immunology about a week later and her heart rate was 64.  The patient had previously been evaluated at [facility] due to palpitations in 2023.  An echocardiogram was done which was reassuring.  She had a monitor placed but those results are not available.  The patient is here with multiple issues including lightheadedness, fatigue, and palpitations.  She is on 3 different medications that cause lightheadedness including armodafinil, Vistaril, and BuSpar.  She has issues with cyclothymia and during these x 1 this is really affecting her her sleep will be very fragmented.  She will feel palpitations when she is laying down during this time.  She denies any shortness of breath or exertional angina.  She does smoke hookah occasionally.  No other illicit substance use.         Current Medications: Current Meds  Medication Sig   acetaminophen (TYLENOL) 500 MG tablet Take 500 mg by mouth every 6 (six) hours as needed for moderate pain or headache.   acyclovir (ZOVIRAX) 400 MG tablet Take 400 mg by mouth daily as needed (cold sores).   albuterol (PROVENTIL HFA;VENTOLIN HFA) 108 (90 BASE) MCG/ACT inhaler Inhale 2 puffs into the lungs every 6 (six) hours as needed for shortness of breath or wheezing. For shortness of breath.   Armodafinil 200 MG TABS Take 1 tablet by mouth daily.  baclofen (LIORESAL) 10 MG tablet Take 10 mg by mouth 2 (two) times daily as needed for muscle spasms.   budesonide-formoterol (SYMBICORT) 160-4.5 MCG/ACT inhaler Inhale 2 puffs into the lungs daily.   busPIRone (BUSPAR) 10 MG tablet Take 10 mg by mouth 2 (two) times daily.   famotidine (PEPCID) 40 MG tablet Take 40 mg by mouth at bedtime.   gabapentin (NEURONTIN) 300 MG capsule Take 600 mg by mouth at bedtime.   Ginkgo Biloba (GNP GINGKO BILOBA EXTRACT  PO) Take 1 capsule by mouth 2 (two) times daily.   hydrOXYzine (VISTARIL) 25 MG capsule Take 50 mg by mouth at bedtime.   ibuprofen (ADVIL) 200 MG tablet Take 600 mg by mouth every 6 (six) hours as needed.   levonorgestrel (MIRENA) 20 MCG/DAY IUD See admin instructions.   magnesium 30 MG tablet Take 500 mg by mouth daily.   methylPREDNISolone (MEDROL DOSEPAK) 4 MG TBPK tablet Use as directed   montelukast (SINGULAIR) 10 MG tablet Take 1 tablet (10 mg total) by mouth at bedtime.   Multiple Vitamins-Minerals (MULTIVITAMIN WOMEN PO) Take 1 tablet by mouth daily.   NON FORMULARY Ibguard 1 tab 2 x day   omeprazole (PRILOSEC) 40 MG capsule Take 40 mg by mouth 2 (two) times daily.   ondansetron (ZOFRAN-ODT) 4 MG disintegrating tablet Take 1 tablet (4 mg total) by mouth every 8 (eight) hours as needed for nausea.   pantoprazole (PROTONIX) 40 MG tablet Take 1 tablet (40 mg total) by mouth 2 (two) times daily.   PEPPERMINT OIL PO Take 90 mg by mouth in the morning and at bedtime.   Potassium 99 MG TABS Take 1 tablet by mouth daily.   pyridOXINE (VITAMIN B6) 100 MG tablet Take 100 mg by mouth daily.   ramelteon (ROZEREM) 8 MG tablet Take 8 mg by mouth at bedtime.   triamcinolone (NASACORT) 55 MCG/ACT AERO nasal inhaler Place 1 spray into the nose daily. [facility] brand   vitamin B-12 (CYANOCOBALAMIN) 100 MCG tablet Take 100 mcg by mouth daily.   zonisamide (ZONEGRAN) 25 MG capsule Take 100 mg by mouth every evening.     Allergies:    Aspirin, Depo-provera [medroxyprogesterone], Nuvaring [etonogestrel-ethinyl estradiol], and Ortho-cyclen [norgestimate-eth estradiol]   Social History:   Social History   Tobacco Use   Smoking status: Never   Smokeless tobacco: Never   Tobacco comments:    Smokes hookah few x month  Vaping Use   Vaping status: Never Used  Substance Use Topics   Alcohol use: Not Currently   Drug use: No     Family Hx: History reviewed. No pertinent family history.   Review of  Systems:   Please see the history of present illness.    All other systems reviewed and are negative.     EKGs/Labs/Other Test Reviewed:   EKG:    EKG Interpretation Date/Time:  Friday October 10 2022 16:26:33 EDT Ventricular Rate:  57 PR Interval:  162 QRS Duration:  84 QT Interval:  436 QTC Calculation: 424 R Axis:   3  Text Interpretation: Sinus bradycardia Possible Anterior infarct , age undetermined When compared with ECG of 25-Jul-2022 12:44, PREVIOUS ECG IS PRESENT Confirmed by Alverda Skeans ([id number]) on 10/10/2022 4:27:36 PM        Prior CV studies reviewed:     TTE 2023 [facility]: Normal LV size, wall thickness, wall motion and systolic function with  ejection fraction 55-60%  Normal left ventricular diastolic function and left atrial pressure.  The right  ventricle is mildly dilated.  The right ventricular systolic function is normal.  The atria are mildly dilated.  There is no significant valvular stenosis or regurgitation.  No pulmonary hypertension.  IVC size was mildly dilated. Right atrial pressure is estimated to be  8 mm Hg.   Recent Labs: 08/05/2022: ALT 29; BUN 16; Creatinine, Ser 0.67; Hemoglobin 12.5; Platelets 256.0; Potassium 3.8; Sodium 139; TSH 3.01   Lipid Panel No results found for: "CHOL", "TRIG", "HDL", "CHOLHDL", "VLDL", "LDLCALC", "LDLDIRECT"  Risk Assessment/Calculations:          Physical Exam:   VS:  BP 110/64   Pulse (!) 57   Ht 5\' 4"  (1.626 m)   Wt 250 lb (113.4 kg)   SpO2 96%   BMI 42.91 kg/m        Wt Readings from Last 3 Encounters:  10/10/22 250 lb (113.4 kg)  07/25/22 245 lb (111.1 kg)  07/23/22 250 lb 9.6 oz (113.7 kg)      GENERAL:  No apparent distress, AOx3 HEENT:  No carotid bruits, +2 carotid impulses, no scleral icterus CAR: RRR no murmurs, gallops, rubs, or thrills RES:  Clear to auscultation bilaterally ABD:  Soft, nontender, nondistended, positive bowel sounds x 4 VASC:  +2 radial pulses, +2 carotid  pulses NEURO:  CN 2-12 grossly intact; motor and sensory grossly intact PSYCH:  No active depression or anxiety EXT:  No edema, ecchymosis, or cyanosis  Signed, Orbie Pyo, MD  10/10/2022 5:05 PM    [facility] [address] [place], [place], [place]  [address] Phone: [phone]; Fax: [phone]   Note:  This document was prepared using Dragon voice recognition software and may include unintentional dictation errors.

## 2022-10-10 ENCOUNTER — Ambulatory Visit: Payer: BC Managed Care – PPO | Attending: Internal Medicine | Admitting: Internal Medicine

## 2022-10-10 ENCOUNTER — Encounter: Payer: Self-pay | Admitting: Internal Medicine

## 2022-10-10 VITALS — BP 110/64 | HR 57 | Ht 64.0 in | Wt 250.0 lb

## 2022-10-10 DIAGNOSIS — R001 Bradycardia, unspecified: Secondary | ICD-10-CM | POA: Diagnosis not present

## 2022-10-10 DIAGNOSIS — Z6841 Body Mass Index (BMI) 40.0 and over, adult: Secondary | ICD-10-CM

## 2022-10-10 NOTE — Patient Instructions (Addendum)
Medication Instructions:  No changes *If you need a refill on your cardiac medications before your next appointment, please call your pharmacy*   Lab Work: none If you have labs (blood work) drawn today and your tests are completely normal, you will receive your results only by: MyChart Message (if you have MyChart) OR A paper copy in the mail If you have any lab test that is abnormal or we need to change your treatment, we will call you to review the results.   Testing/Procedures: Zio Heart Monitor 7 days - see instructions below   Follow-Up: As needed   ZIO XT- Long Term Monitor Instructions  Your physician has requested you wear a ZIO patch monitor for 7 days.   This is a single patch monitor. [employer] supplies one patch monitor per enrollment. Additional stickers are not available. Please do not apply patch if you will be having a Nuclear Stress Test,  Echocardiogram, Cardiac CT, MRI, or Chest Xray during the period you would be wearing the  monitor. The patch cannot be worn during these tests. You cannot remove and re-apply the  ZIO XT patch monitor.  Your ZIO patch monitor will be mailed 3 day USPS to your address on file. It may take 3-5 days  to receive your monitor after you have been enrolled.  Once you have received your monitor, please review the enclosed instructions. Your monitor  has already been registered assigning a specific monitor serial # to you.  Billing and Patient Assistance Program Information  We have supplied [employer] with any of your insurance information on file for billing purposes. [employer] offers a sliding scale Patient Assistance Program for patients that do not have  insurance, or whose insurance does not completely cover the cost of the ZIO monitor.  You must apply for the Patient Assistance Program to qualify for this discounted rate.  To apply, please call [employer] at [phone], select option 4, select option 2, ask to apply for  Patient  Assistance Program. Meredeth Ide will ask your household income, and how many people  are in your household. They will quote your out-of-pocket cost based on that information.  [employer] will also be able to set up a 47-month, interest-free payment plan if needed.  Applying the monitor  Shave hair from upper left chest.  Hold abrader disc by orange tab. Rub abrader in 40 strokes over the upper left chest as  indicated in your monitor instructions.  Clean area with 4 enclosed alcohol pads. Let dry.  Apply patch as indicated in monitor instructions. Patch will be placed under collarbone on left  side of chest with arrow pointing upward.  Rub patch adhesive wings for 2 minutes. Remove white label marked "1". Remove the white  label marked "2". Rub patch adhesive wings for 2 additional minutes.  While looking in a mirror, press and release button in center of patch. A small green light will  flash 3-4 times. This will be your only indicator that the monitor has been turned on.  Do not shower for the first 24 hours. You may shower after the first 24 hours.  Press the button if you feel a symptom. You will hear a small click. Record Date, Time and  Symptom in the Patient Logbook.  When you are ready to remove the patch, follow instructions on the last 2 pages of Patient  Logbook. Stick patch monitor onto the last page of Patient Logbook.  Place Patient Logbook in the blue and white box. Use  locking tab on box and tape box closed  securely. The blue and white box has prepaid postage on it. Please place it in the mailbox as  soon as possible. Your physician should have your test results approximately 7 days after the  monitor has been mailed back to [facility].  Call [facility] Customer Care at [phone] if you have questions regarding  your ZIO XT patch monitor. Call them immediately if you see an orange light blinking on your  monitor.  If your monitor falls off in less than 4 days,  contact our Monitor department at [phone].  If your monitor becomes loose or falls off after 4 days call [employer] at [phone] for  suggestions on securing your monitor

## 2022-10-13 ENCOUNTER — Ambulatory Visit: Payer: BC Managed Care – PPO | Attending: Internal Medicine

## 2022-10-13 DIAGNOSIS — R001 Bradycardia, unspecified: Secondary | ICD-10-CM

## 2022-10-13 NOTE — Progress Notes (Unsigned)
Enrolled for [employer] to mail a ZIO XT long term holter monitor to the patients address on file.  

## 2022-10-20 DIAGNOSIS — F39 Unspecified mood [affective] disorder: Secondary | ICD-10-CM | POA: Diagnosis not present

## 2022-10-26 DIAGNOSIS — R001 Bradycardia, unspecified: Secondary | ICD-10-CM | POA: Diagnosis not present

## 2022-10-27 ENCOUNTER — Encounter: Payer: Self-pay | Admitting: Internal Medicine

## 2022-11-03 DIAGNOSIS — F39 Unspecified mood [affective] disorder: Secondary | ICD-10-CM | POA: Diagnosis not present

## 2022-11-10 DIAGNOSIS — F444 Conversion disorder with motor symptom or deficit: Secondary | ICD-10-CM | POA: Diagnosis not present

## 2022-11-10 DIAGNOSIS — J383 Other diseases of vocal cords: Secondary | ICD-10-CM | POA: Diagnosis not present

## 2022-11-10 DIAGNOSIS — J385 Laryngeal spasm: Secondary | ICD-10-CM | POA: Diagnosis not present

## 2022-11-10 DIAGNOSIS — F17210 Nicotine dependence, cigarettes, uncomplicated: Secondary | ICD-10-CM | POA: Diagnosis not present

## 2022-11-10 DIAGNOSIS — J387 Other diseases of larynx: Secondary | ICD-10-CM | POA: Diagnosis not present

## 2022-11-12 DIAGNOSIS — G43919 Migraine, unspecified, intractable, without status migrainosus: Secondary | ICD-10-CM | POA: Diagnosis not present

## 2022-11-17 DIAGNOSIS — G518 Other disorders of facial nerve: Secondary | ICD-10-CM | POA: Diagnosis not present

## 2022-11-17 DIAGNOSIS — F39 Unspecified mood [affective] disorder: Secondary | ICD-10-CM | POA: Diagnosis not present

## 2022-11-17 DIAGNOSIS — M542 Cervicalgia: Secondary | ICD-10-CM | POA: Diagnosis not present

## 2022-11-17 DIAGNOSIS — G43719 Chronic migraine without aura, intractable, without status migrainosus: Secondary | ICD-10-CM | POA: Diagnosis not present

## 2022-11-17 DIAGNOSIS — M791 Myalgia, unspecified site: Secondary | ICD-10-CM | POA: Diagnosis not present

## 2022-12-01 DIAGNOSIS — F39 Unspecified mood [affective] disorder: Secondary | ICD-10-CM | POA: Diagnosis not present

## 2022-12-02 ENCOUNTER — Telehealth: Payer: Self-pay | Admitting: Physician Assistant

## 2022-12-02 ENCOUNTER — Encounter: Payer: Self-pay | Admitting: Radiology

## 2022-12-02 NOTE — Telephone Encounter (Signed)
Prescription Request  12/02/2022  LOV: 07/23/2022  What is the name of the medication or equipment?   ALL MEDS PT NEEDS  Have you contacted your pharmacy to request a refill? No   Which pharmacy would you like this sent to?  [id number] [place] [place] - [address] [place] [address] Phone: [phone] Fax: [phone]    Patient notified that their request is being sent to the clinical staff for review and that they should receive a response within 2 business days.   Please advise at Mobile [phone] (mobile)

## 2022-12-05 DIAGNOSIS — F411 Generalized anxiety disorder: Secondary | ICD-10-CM | POA: Diagnosis not present

## 2022-12-05 DIAGNOSIS — G4723 Circadian rhythm sleep disorder, irregular sleep wake type: Secondary | ICD-10-CM | POA: Diagnosis not present

## 2022-12-05 DIAGNOSIS — F340 Cyclothymic disorder: Secondary | ICD-10-CM | POA: Diagnosis not present

## 2022-12-05 DIAGNOSIS — G4730 Sleep apnea, unspecified: Secondary | ICD-10-CM | POA: Diagnosis not present

## 2022-12-15 DIAGNOSIS — F39 Unspecified mood [affective] disorder: Secondary | ICD-10-CM | POA: Diagnosis not present

## 2022-12-29 DIAGNOSIS — F39 Unspecified mood [affective] disorder: Secondary | ICD-10-CM | POA: Diagnosis not present

## 2022-12-31 ENCOUNTER — Other Ambulatory Visit: Payer: Self-pay | Admitting: Internal Medicine

## 2023-01-12 DIAGNOSIS — F39 Unspecified mood [affective] disorder: Secondary | ICD-10-CM | POA: Diagnosis not present

## 2023-01-15 DIAGNOSIS — H532 Diplopia: Secondary | ICD-10-CM | POA: Diagnosis not present

## 2023-01-15 DIAGNOSIS — G43019 Migraine without aura, intractable, without status migrainosus: Secondary | ICD-10-CM | POA: Diagnosis not present

## 2023-01-26 DIAGNOSIS — F39 Unspecified mood [affective] disorder: Secondary | ICD-10-CM | POA: Diagnosis not present

## 2023-02-03 DIAGNOSIS — G4723 Circadian rhythm sleep disorder, irregular sleep wake type: Secondary | ICD-10-CM | POA: Diagnosis not present

## 2023-02-03 DIAGNOSIS — F411 Generalized anxiety disorder: Secondary | ICD-10-CM | POA: Diagnosis not present

## 2023-02-03 DIAGNOSIS — G4730 Sleep apnea, unspecified: Secondary | ICD-10-CM | POA: Diagnosis not present

## 2023-02-03 DIAGNOSIS — F340 Cyclothymic disorder: Secondary | ICD-10-CM | POA: Diagnosis not present

## 2023-02-09 ENCOUNTER — Telehealth: Payer: Self-pay | Admitting: Physician Assistant

## 2023-02-09 ENCOUNTER — Ambulatory Visit: Payer: BC Managed Care – PPO | Admitting: Physician Assistant

## 2023-02-09 DIAGNOSIS — F39 Unspecified mood [affective] disorder: Secondary | ICD-10-CM | POA: Diagnosis not present

## 2023-02-09 NOTE — Telephone Encounter (Signed)
Patient requesting TOC to Jarold Motto

## 2023-02-11 DIAGNOSIS — J383 Other diseases of vocal cords: Secondary | ICD-10-CM | POA: Diagnosis not present

## 2023-02-11 DIAGNOSIS — F17210 Nicotine dependence, cigarettes, uncomplicated: Secondary | ICD-10-CM | POA: Diagnosis not present

## 2023-02-11 DIAGNOSIS — J385 Laryngeal spasm: Secondary | ICD-10-CM | POA: Diagnosis not present

## 2023-02-11 DIAGNOSIS — J387 Other diseases of larynx: Secondary | ICD-10-CM | POA: Diagnosis not present

## 2023-02-11 DIAGNOSIS — F444 Conversion disorder with motor symptom or deficit: Secondary | ICD-10-CM | POA: Diagnosis not present

## 2023-02-13 ENCOUNTER — Telehealth: Payer: Self-pay | Admitting: Physician Assistant

## 2023-02-13 ENCOUNTER — Other Ambulatory Visit: Payer: Self-pay

## 2023-02-13 ENCOUNTER — Ambulatory Visit: Payer: BC Managed Care – PPO | Admitting: Physician Assistant

## 2023-02-13 MED ORDER — FAMOTIDINE 40 MG PO TABS: 40.0000 mg | ORAL_TABLET | Freq: Every day | ORAL | 0 refills | Status: DC

## 2023-02-13 MED ORDER — OMEPRAZOLE 40 MG PO CPDR: 40.0000 mg | DELAYED_RELEASE_CAPSULE | Freq: Two times a day (BID) | ORAL | 0 refills | Status: DC

## 2023-02-13 NOTE — Telephone Encounter (Signed)
Please see pt request and advise

## 2023-02-13 NOTE — Telephone Encounter (Signed)
Patient informed.

## 2023-02-13 NOTE — Telephone Encounter (Signed)
Rx sent to pharmacy

## 2023-02-13 NOTE — Telephone Encounter (Signed)
Patient is currently established with Allwardt at [facility].    Due to patients work schedule, she is in need of appointments 3pm or later.   Current  provider schedule can not accommodate this time frame.    Patient works and lives near the [employer] location.    We are requesting transfer to the [facility] location.    Please advise.

## 2023-02-13 NOTE — Telephone Encounter (Signed)
Patient is requesting refills on famotidine and [facility] [place]  Patient would like 3 month script.

## 2023-02-17 ENCOUNTER — Ambulatory Visit: Payer: BC Managed Care – PPO | Admitting: Physician Assistant

## 2023-02-17 ENCOUNTER — Other Ambulatory Visit: Payer: Self-pay | Admitting: Internal Medicine

## 2023-02-17 DIAGNOSIS — W101XXA Fall (on)(from) sidewalk curb, initial encounter: Secondary | ICD-10-CM | POA: Diagnosis not present

## 2023-02-17 DIAGNOSIS — S80211A Abrasion, right knee, initial encounter: Secondary | ICD-10-CM | POA: Diagnosis not present

## 2023-02-17 DIAGNOSIS — M791 Myalgia, unspecified site: Secondary | ICD-10-CM | POA: Diagnosis not present

## 2023-02-17 DIAGNOSIS — M542 Cervicalgia: Secondary | ICD-10-CM | POA: Diagnosis not present

## 2023-02-17 DIAGNOSIS — G43719 Chronic migraine without aura, intractable, without status migrainosus: Secondary | ICD-10-CM | POA: Diagnosis not present

## 2023-02-17 DIAGNOSIS — S93515A Sprain of interphalangeal joint of left lesser toe(s), initial encounter: Secondary | ICD-10-CM | POA: Diagnosis not present

## 2023-02-17 DIAGNOSIS — G518 Other disorders of facial nerve: Secondary | ICD-10-CM | POA: Diagnosis not present

## 2023-02-17 DIAGNOSIS — S60511A Abrasion of right hand, initial encounter: Secondary | ICD-10-CM | POA: Diagnosis not present

## 2023-02-17 DIAGNOSIS — S93401A Sprain of unspecified ligament of right ankle, initial encounter: Secondary | ICD-10-CM | POA: Diagnosis not present

## 2023-02-18 ENCOUNTER — Ambulatory Visit: Payer: BC Managed Care – PPO | Admitting: Physician Assistant

## 2023-02-19 NOTE — Telephone Encounter (Signed)
Called patient and scheduled for TOC

## 2023-02-23 ENCOUNTER — Telehealth (INDEPENDENT_AMBULATORY_CARE_PROVIDER_SITE_OTHER): Payer: BC Managed Care – PPO | Admitting: Physician Assistant

## 2023-02-23 ENCOUNTER — Other Ambulatory Visit (HOSPITAL_COMMUNITY): Payer: Self-pay

## 2023-02-23 ENCOUNTER — Telehealth: Payer: Self-pay

## 2023-02-23 VITALS — Ht 64.0 in | Wt 250.0 lb

## 2023-02-23 DIAGNOSIS — R42 Dizziness and giddiness: Secondary | ICD-10-CM

## 2023-02-23 DIAGNOSIS — K219 Gastro-esophageal reflux disease without esophagitis: Secondary | ICD-10-CM

## 2023-02-23 DIAGNOSIS — G43009 Migraine without aura, not intractable, without status migrainosus: Secondary | ICD-10-CM | POA: Diagnosis not present

## 2023-02-23 MED ORDER — TRAMADOL HCL 50 MG PO TABS
50.0000 mg | ORAL_TABLET | Freq: Three times a day (TID) | ORAL | 0 refills | Status: DC | PRN
Start: 2023-02-23 — End: 2023-03-17

## 2023-02-23 MED ORDER — FAMOTIDINE 40 MG PO TABS: 40.0000 mg | ORAL_TABLET | Freq: Every day | ORAL | 0 refills | Status: DC

## 2023-02-23 MED ORDER — OMEPRAZOLE 40 MG PO CPDR
40.0000 mg | DELAYED_RELEASE_CAPSULE | Freq: Two times a day (BID) | ORAL | 0 refills | Status: DC
Start: 2023-02-23 — End: 2023-08-17

## 2023-02-23 NOTE — Progress Notes (Signed)
   Virtual Visit via Video Note  I connected with  Claudia Walsh  on 02/23/23 at 11:00 AM EST by a video enabled telemedicine application and verified that I am speaking with the correct person using two identifiers.  Location: Patient: home Provider: Nature conservation officer at [employer] Persons present: Patient and myself   I discussed the limitations of evaluation and management by telemedicine and the availability of in person appointments. The patient expressed understanding and agreed to proceed.   History of Present Illness:  History of Present Illness   Claudia Walsh, a patient with a known history of migraines, presents with an increase in the frequency of her migraines to about two to three times a week over the past two months. She reports that the combination of baclofen and tramadol, which she had leftover from a previous condition, has been effective in alleviating her migraines. She is requesting a refill on tramadol today. She sees Dr. Neale Burly [facility], but he is unable to fill the tramadol. She also mentions other health concerns, including vocal cord dysfunction, IBS, and heart palpitations. Despite these symptoms, tests have not revealed any heart abnormalities or POTS. She also expresses concern about potential underlying conditions, such as mono and long COVID, as her daughter was recently diagnosed with these conditions.         Observations/Objective:   Gen: Awake, alert, no acute distress Resp: Breathing is even and non-labored Psych: calm/pleasant demeanor Neuro: Alert and Oriented x 3, + facial symmetry, speech is clear.   Assessment and Plan:  Assessment and Plan    Migraine Increased frequency of migraines (2-3 times per week) despite preventive measures (zonisamide 150mg  daily and nerve blockers every 6 weeks). Baclofen and tizanidine have been ineffective as monotherapy. Tramadol in combination with baclofen has been  effective in aborting migraines. -Prescribe Tramadol 30 tablets for use in conjunction with Baclofen for acute migraine management. Pt aware of risks vs benefits and possible adverse reactions. Do not drive with this medication. -Refer to Dr. Mindi Slicker at [facility] for further neurology evaluation and management per pt request.  Gastroesophageal Reflux Disease Unclear if omeprazole prescription was filled. -Resubmit prescription for Omeprazole 40mg  twice daily.  General Health Maintenance Patient inquired about testing for chronic illnesses such as mono and long COVID due to ongoing fatigue. Explained that these are not typically tested or treated in this practice setting. -No further action at this time.        Follow Up Instructions:    I discussed the assessment and treatment plan with the patient. The patient was provided an opportunity to ask questions and all were answered. The patient agreed with the plan and demonstrated an understanding of the instructions.   The patient was advised to call back or seek an in-person evaluation if the symptoms worsen or if the condition fails to improve as anticipated.  Claudia Tomasso M Wynn Kernes, PA-C

## 2023-02-23 NOTE — Telephone Encounter (Signed)
Claudia Walsh (KeyLennie Muckle) PA Case ID #: [id number] Rx #: [id number] Status: Sent to Plan today Drug:  traMADol HCl 50MG  tablets

## 2023-02-23 NOTE — Telephone Encounter (Signed)
Pharmacy Patient Advocate Encounter  Received notification from [facility] that Prior Authorization for Tramadol has been APPROVED from 02/23/2023 to 08/22/2023. Unable to obtain price due to refill too soon rejection, last fill date 02/23/2023 next available fill date02/11/2023

## 2023-02-26 ENCOUNTER — Other Ambulatory Visit (HOSPITAL_COMMUNITY): Payer: Self-pay

## 2023-02-26 ENCOUNTER — Telehealth: Payer: Self-pay

## 2023-02-26 NOTE — Telephone Encounter (Signed)
 Pharmacy Patient Advocate Encounter  Received notification from [facility] that Prior Authorization for Tramadol  50 mg tablets has been APPROVED from 02/23/23 to 08/22/23. Unable to obtain price due to refill too soon rejection, last fill date 02/23/23 next available fill date02/11/25   PA #/Case ID/Reference #: [id number]

## 2023-03-17 ENCOUNTER — Encounter: Payer: Self-pay | Admitting: Physician Assistant

## 2023-03-17 ENCOUNTER — Ambulatory Visit: Payer: BC Managed Care – PPO | Admitting: Physician Assistant

## 2023-03-17 VITALS — BP 107/72 | HR 65 | Temp 97.8°F | Resp 18 | Ht 60.0 in | Wt 248.8 lb

## 2023-03-17 DIAGNOSIS — J383 Other diseases of vocal cords: Secondary | ICD-10-CM

## 2023-03-17 DIAGNOSIS — H6991 Unspecified Eustachian tube disorder, right ear: Secondary | ICD-10-CM

## 2023-03-17 DIAGNOSIS — G43009 Migraine without aura, not intractable, without status migrainosus: Secondary | ICD-10-CM | POA: Diagnosis not present

## 2023-03-17 DIAGNOSIS — Z23 Encounter for immunization: Secondary | ICD-10-CM

## 2023-03-17 DIAGNOSIS — Z1231 Encounter for screening mammogram for malignant neoplasm of breast: Secondary | ICD-10-CM

## 2023-03-17 MED ORDER — BACLOFEN 10 MG PO TABS: 10.0000 mg | ORAL_TABLET | Freq: Three times a day (TID) | ORAL | 0 refills | Status: DC

## 2023-03-17 MED ORDER — TRAMADOL HCL 50 MG PO TABS: 50.0000 mg | ORAL_TABLET | Freq: Three times a day (TID) | ORAL | 0 refills | Status: AC | PRN

## 2023-03-17 MED ORDER — PREDNISONE 20 MG PO TABS: 20.0000 mg | ORAL_TABLET | Freq: Every day | ORAL | 0 refills | Status: AC

## 2023-03-17 NOTE — Patient Instructions (Signed)
 Neurology: Gibson Ramp, JEFFREY B-Neuro Address: [address] [place] [address] Phone: [phone]

## 2023-03-17 NOTE — Assessment & Plan Note (Signed)
 Chronic, pt is in speech therapy

## 2023-03-17 NOTE — Assessment & Plan Note (Signed)
 Pt manages with tramadol, baclofen.  Ok to use this for jaw/ear pain as well

## 2023-03-17 NOTE — Progress Notes (Signed)
 New patient visit   Patient: Claudia Walsh   DOB: [birth year]   43 y.o. Female  MRN: [id number] Visit Date: 03/17/2023  Today's healthcare provider: Alfredia Ferguson, PA-C   Chief Complaint  Patient presents with   [facility]- NP    Concerns/ questions: TMJ Transferring from: [facility] Pap: [facility] Tdap: pt says this should be UTD Flu: none Pna: none   Subjective    Claudia Walsh is a 43 y.o. female who presents today as a new patient to establish care.   Discussed the use of AI scribe software for clinical note transcription with the patient, who gave verbal consent to proceed.  History of Present Illness   Claudia Walsh, a patient with a history of vocal cord dysfunction, irritable bowel syndrome, and sleep apnea, presents with a chief complaint of what she thinks is TMJ pain. She reports that the pain is severe and localized to her right inner ear, describing it as feeling like an ear infection. The pain does not radiate to other areas of the face or neck. She has been managing the pain with prescription NSAIDs and tizanidine, but finds the latter medication overly sedating. She has sought alternative treatments such as acupuncture, which provided temporary relief. She suspects that the onset of her TMJ pain might be related to a new chin strap she has been using with her CPAP machine for sleep apnea.  In addition to her TMJ pain, Claudia Walsh also discusses her vocal cord dysfunction, which she has had since childhood but was only diagnosed last year. She reports that certain medications, particularly those that affect her nervous system, trigger her vocal cord dysfunction. She has stopped taking certain medications, such as Buspar and over-the-counter antihistamines, as they seemed to exacerbate her condition. She also mentions that she has severe IBS, which sometimes causes her to pass out, and she believes this might be related to her vocal cord dysfunction as both conditions  seem to be connected to her vagus nerve.        Past Medical History:  Diagnosis Date   ADD (attention deficit disorder)    Anemia "in my early 20's"   Anxiety    Asthma    Chronic bronchitis (HCC)    Cyclothymia    Depression    states off all meds   GERD (gastroesophageal reflux disease)    H/O hiatal hernia    IBS (irritable bowel syndrome)    Kidney stone    "passed them" 2009   migraine headache    "yearly" (06/12/2015)   Morbid obesity (HCC)    PCOS (polycystic ovarian syndrome)    Sleep apnea    cpap autoset   Small bowel obstruction [facility])    Past Surgical History:  Procedure Laterality Date   CESAREAN SECTION  01/21/2007   CHOLECYSTECTOMY  12/12/2011   Procedure: LAPAROSCOPIC CHOLECYSTECTOMY WITH INTRAOPERATIVE CHOLANGIOGRAM;  Surgeon: Wilmon Arms. Corliss Skains, MD;  Location: [facility];  Service: General;  Laterality: N/A;   CYSTOSCOPY WITH RETROGRADE PYELOGRAM, URETEROSCOPY AND STENT PLACEMENT Left 06/10/2022   Procedure: CYSTOSCOPY WITH RETROGRADE PYELOGRAM, URETEROSCOPY AND STENT PLACEMENT;  Surgeon: Crist Fat, MD;  Location: [facility];  Service: Urology;  Laterality: Left;   CYSTOSCOPY/URETEROSCOPY/HOLMIUM LASER/STENT PLACEMENT Left 06/19/2022   Procedure: CYSTOSCOPY, LEFT URETEROSCOPY, RETROGRADE PYELOGRAM, BASKET STONE EXTRACTION; STENT REMOVAL;  Surgeon: Crist Fat, MD;  Location: [facility];  Service: Urology;  Laterality: Left;   TYMPANOSTOMY TUBE PLACEMENT  ~ 1989   UPPER GASTROINTESTINAL  ENDOSCOPY     WISDOM TOOTH EXTRACTION  01/21/1999   Family Status  Relation Name Status   Mother  Alive   Father  Alive   Sister  (Not Specified)   Daughter  (Not Specified)   MGM  (Not Specified)  No partnership data on file   Family History  Problem Relation Age of Onset   Cancer Mother        Breast   Obesity Mother    Hypertension Father    Obesity Sister    Obesity Daughter    Obesity Maternal Grandmother    Social History    Socioeconomic History   Marital status: Divorced    Spouse name: Not on file   Number of children: Not on file   Years of education: Not on file   Highest education level: Not on file  Occupational History   Not on file  Tobacco Use   Smoking status: Never   Smokeless tobacco: Never   Tobacco comments:    Smokes hookah few x month  Vaping Use   Vaping status: Never Used  Substance and Sexual Activity   Alcohol use: Not Currently   Drug use: No   Sexual activity: Not on file  Other Topics Concern   Not on file  Social History Narrative   Not on file   Social Drivers of Health   Financial Resource Strain: Not on file  Food Insecurity: Not on file  Transportation Needs: Not on file  Physical Activity: Not on file  Stress: Not on file  Social Connections: Unknown (10/21/2021)   [facility]   Social Network    Social Network: Not on file   Outpatient Medications Prior to Visit  Medication Sig   acetaminophen (TYLENOL) 500 MG tablet Take 500 mg by mouth every 6 (six) hours as needed for moderate pain or headache.   acyclovir (ZOVIRAX) 400 MG tablet Take 400 mg by mouth daily as needed (cold sores).   Armodafinil 200 MG TABS Take 1 tablet by mouth daily.   diclofenac (VOLTAREN) 75 MG EC tablet Take 75 mg by mouth 2 (two) times daily.   famotidine (PEPCID) 40 MG tablet Take 1 tablet (40 mg total) by mouth at bedtime.   gabapentin (NEURONTIN) 300 MG capsule Take 600 mg by mouth at bedtime.   hydrOXYzine (VISTARIL) 25 MG capsule Take 50 mg by mouth at bedtime.   ibuprofen (ADVIL) 200 MG tablet Take 600 mg by mouth every 6 (six) hours as needed.   levonorgestrel (MIRENA) 20 MCG/DAY IUD See admin instructions.   magnesium 30 MG tablet Take 500 mg by mouth daily.   montelukast (SINGULAIR) 10 MG tablet Take 1 tablet (10 mg total) by mouth at bedtime.   Multiple Vitamins-Minerals (MULTIVITAMIN WOMEN PO) Take 1 tablet by mouth daily.   NON FORMULARY  Ibguard 1 tab 2 x day   omeprazole (PRILOSEC) 40 MG capsule Take 1 capsule (40 mg total) by mouth 2 (two) times daily.   ondansetron (ZOFRAN-ODT) 4 MG disintegrating tablet Take 1 tablet (4 mg total) by mouth every 8 (eight) hours as needed for nausea.   PEPPERMINT OIL PO Take 90 mg by mouth in the morning and at bedtime.   ramelteon (ROZEREM) 8 MG tablet Take 8 mg by mouth at bedtime.   triamcinolone (NASACORT) 55 MCG/ACT AERO nasal inhaler Place 1 spray into the nose daily. [facility] brand   zonisamide (ZONEGRAN) 25 MG capsule Take 100 mg by mouth every  evening.   [DISCONTINUED] traMADol (ULTRAM) 50 MG tablet Take 1 tablet (50 mg total) by mouth every 8 (eight) hours as needed (Take with baclofen for migraine relief.).   [DISCONTINUED] albuterol (PROVENTIL HFA;VENTOLIN HFA) 108 (90 BASE) MCG/ACT inhaler Inhale 2 puffs into the lungs every 6 (six) hours as needed for shortness of breath or wheezing. For shortness of breath. (Patient not taking: Reported on 03/17/2023)   [DISCONTINUED] baclofen (LIORESAL) 10 MG tablet Take 10 mg by mouth 2 (two) times daily as needed for muscle spasms. (Patient not taking: Reported on 03/17/2023)   [DISCONTINUED] budesonide-formoterol (SYMBICORT) 160-4.5 MCG/ACT inhaler Inhale 2 puffs into the lungs daily. (Patient not taking: Reported on 03/17/2023)   [DISCONTINUED] busPIRone (BUSPAR) 10 MG tablet Take 10 mg by mouth 2 (two) times daily. (Patient not taking: Reported on 03/17/2023)   [DISCONTINUED] Ginkgo Biloba (GNP GINGKO BILOBA EXTRACT PO) Take 1 capsule by mouth 2 (two) times daily. (Patient not taking: Reported on 03/17/2023)   [DISCONTINUED] methylPREDNISolone (MEDROL DOSEPAK) 4 MG TBPK tablet Use as directed   [DISCONTINUED] pantoprazole (PROTONIX) 40 MG tablet Take 1 tablet (40 mg total) by mouth 2 (two) times daily. (Patient not taking: Reported on 03/17/2023)   [DISCONTINUED] Potassium 99 MG TABS Take 1 tablet by mouth daily. (Patient not taking: Reported on 03/17/2023)    [DISCONTINUED] pyridOXINE (VITAMIN B6) 100 MG tablet Take 100 mg by mouth daily. (Patient not taking: Reported on 03/17/2023)   [DISCONTINUED] vitamin B-12 (CYANOCOBALAMIN) 100 MCG tablet Take 100 mcg by mouth daily. (Patient not taking: Reported on 03/17/2023)   No facility-administered medications prior to visit.   Allergies  Allergen Reactions   Aspirin Other (See Comments)    Nose bleeds Nose bleeds    Depo-Provera [Medroxyprogesterone]     Weight gain   Nuvaring [Etonogestrel-Ethinyl Estradiol]     Rash on face,mood swings   Ortho-Cyclen [Norgestimate-Eth Estradiol]    Tape     adhesive    Immunization History  Administered Date(s) Administered   Influenza, Seasonal, Injecte, Preservative Fre 03/17/2023   Moderna Sars-Covid-2 Vaccination 04/16/2019, 05/30/2019, 04/05/2020    Health Maintenance  Topic Date Due   Pneumococcal Vaccine 35-73 Years old (1 of 2 - PCV) Never done   MAMMOGRAM  Never done   DTaP/Tdap/Td (1 - Tdap) Never done   Cervical Cancer Screening (HPV/Pap Cotest)  05/09/2018   COVID-19 Vaccine (4 - 2024-25 season) 09/21/2022   Hepatitis C Screening  07/23/2023 (Originally 12/05/2000)   INFLUENZA VACCINE  Completed   HIV Screening  Completed   HPV VACCINES  Aged Out    Patient Care Team: Alfredia Ferguson, PA-C as PCP - General (Physician Assistant)  Review of Systems  Constitutional:  Negative for fatigue and fever.  HENT:  Positive for ear pain.   Respiratory:  Negative for cough and shortness of breath.   Cardiovascular:  Negative for chest pain and leg swelling.  Gastrointestinal:  Negative for abdominal pain.  Neurological:  Negative for dizziness and headaches.        Objective    BP 107/72 (BP Location: Left Wrist, Patient Position: Sitting, Cuff Size: Large)   Pulse 65   Temp 97.8 F (36.6 C) (Oral)   Resp 18   Ht 5' (1.524 m)   Wt 248 lb 12.8 oz (112.9 kg)   SpO2 98%   BMI 48.59 kg/m     Physical Exam Constitutional:       General: She is awake.     Appearance: She is well-developed.  HENT:     Head: Normocephalic.     Jaw: There is normal jaw occlusion. No pain on movement or malocclusion.     Left Ear: Tympanic membrane normal.     Ears:     Comments: Clear fluid behind right TM no erythema, bulging Eyes:     Conjunctiva/sclera: Conjunctivae normal.  Cardiovascular:     Rate and Rhythm: Normal rate and regular rhythm.     Heart sounds: Normal heart sounds.  Pulmonary:     Effort: Pulmonary effort is normal.     Breath sounds: Normal breath sounds.  Skin:    General: Skin is warm.  Neurological:     Mental Status: She is alert and oriented to person, place, and time.  Psychiatric:        Attention and Perception: Attention normal.        Mood and Affect: Mood normal.        Speech: Speech normal.        Behavior: Behavior is cooperative.    Depression Screen    03/17/2023   11:23 AM 07/23/2022    1:44 PM  PHQ 2/9 Scores  PHQ - 2 Score 1 0   No results found for any visits on 03/17/23.  Assessment & Plan     Acute dysfunction of right eustachian tube Assessment & Plan: Vs TMJ. Jaw exam normal Trial of prednisone 20 x 5 days, recommend cont steroid nasal spray, use saline nasal spray otc.   Recommending looking for oral surgeons covered by her insurance to see if they assist w/ TMJ  Orders: -     predniSONE; Take 1 tablet (20 mg total) by mouth daily with breakfast for 5 days.  Dispense: 5 tablet; Refill: 0  Migraine without aura and without status migrainosus, not intractable Assessment & Plan: Pt manages with tramadol, baclofen.  Ok to use this for jaw/ear pain as well  Orders: -     traMADol HCl; Take 1 tablet (50 mg total) by mouth every 8 (eight) hours as needed (Take with baclofen for migraine relief.).  Dispense: 30 tablet; Refill: 0 -     Baclofen; Take 1 tablet (10 mg total) by mouth 3 (three) times daily.  Dispense: 90 tablet; Refill: 0  Breast cancer screening by  mammogram -     3D Screening Mammogram, Left and Right; Future  Need for influenza vaccination -     Flu vaccine trivalent PF, 6mos and older(Flulaval,Afluria,Fluarix,Fluzone)  Vocal cord dysfunction Assessment & Plan: Chronic, pt is in speech therapy    Return if symptoms worsen or fail to improve.      Alfredia Ferguson, PA-C  [facility] [phone] (phone) [phone] (fax)  [facility]

## 2023-03-17 NOTE — Assessment & Plan Note (Signed)
 Vs TMJ. Jaw exam normal Trial of prednisone 20 x 5 days, recommend cont steroid nasal spray, use saline nasal spray otc.   Recommending looking for oral surgeons covered by her insurance to see if they assist w/ TMJ

## 2023-03-18 ENCOUNTER — Ambulatory Visit
Admission: RE | Admit: 2023-03-18 | Discharge: 2023-03-18 | Disposition: A | Discharge status: Home / Self Care | Payer: BC Managed Care – PPO | Source: Ambulatory Visit | Attending: Physician Assistant | Admitting: Physician Assistant

## 2023-03-18 DIAGNOSIS — Z1231 Encounter for screening mammogram for malignant neoplasm of breast: Secondary | ICD-10-CM

## 2023-03-20 ENCOUNTER — Encounter: Payer: Self-pay | Admitting: Physician Assistant

## 2023-03-23 DIAGNOSIS — F39 Unspecified mood [affective] disorder: Secondary | ICD-10-CM | POA: Diagnosis not present

## 2023-03-23 DIAGNOSIS — M5136 Other intervertebral disc degeneration, lumbar region with discogenic back pain only: Secondary | ICD-10-CM | POA: Diagnosis not present

## 2023-03-23 DIAGNOSIS — M9903 Segmental and somatic dysfunction of lumbar region: Secondary | ICD-10-CM | POA: Diagnosis not present

## 2023-03-23 DIAGNOSIS — M9901 Segmental and somatic dysfunction of cervical region: Secondary | ICD-10-CM | POA: Diagnosis not present

## 2023-03-23 DIAGNOSIS — M5386 Other specified dorsopathies, lumbar region: Secondary | ICD-10-CM | POA: Diagnosis not present

## 2023-03-23 DIAGNOSIS — M9905 Segmental and somatic dysfunction of pelvic region: Secondary | ICD-10-CM | POA: Diagnosis not present

## 2023-03-24 DIAGNOSIS — G518 Other disorders of facial nerve: Secondary | ICD-10-CM | POA: Diagnosis not present

## 2023-03-24 DIAGNOSIS — J385 Laryngeal spasm: Secondary | ICD-10-CM | POA: Diagnosis not present

## 2023-03-24 DIAGNOSIS — G43719 Chronic migraine without aura, intractable, without status migrainosus: Secondary | ICD-10-CM | POA: Diagnosis not present

## 2023-03-24 DIAGNOSIS — F17210 Nicotine dependence, cigarettes, uncomplicated: Secondary | ICD-10-CM | POA: Diagnosis not present

## 2023-03-24 DIAGNOSIS — J383 Other diseases of vocal cords: Secondary | ICD-10-CM | POA: Diagnosis not present

## 2023-03-24 DIAGNOSIS — F444 Conversion disorder with motor symptom or deficit: Secondary | ICD-10-CM | POA: Diagnosis not present

## 2023-03-24 DIAGNOSIS — M542 Cervicalgia: Secondary | ICD-10-CM | POA: Diagnosis not present

## 2023-03-24 DIAGNOSIS — J387 Other diseases of larynx: Secondary | ICD-10-CM | POA: Diagnosis not present

## 2023-03-24 DIAGNOSIS — M791 Myalgia, unspecified site: Secondary | ICD-10-CM | POA: Diagnosis not present

## 2023-03-31 DIAGNOSIS — G4730 Sleep apnea, unspecified: Secondary | ICD-10-CM | POA: Diagnosis not present

## 2023-03-31 DIAGNOSIS — F411 Generalized anxiety disorder: Secondary | ICD-10-CM | POA: Diagnosis not present

## 2023-03-31 DIAGNOSIS — F340 Cyclothymic disorder: Secondary | ICD-10-CM | POA: Diagnosis not present

## 2023-03-31 DIAGNOSIS — G4723 Circadian rhythm sleep disorder, irregular sleep wake type: Secondary | ICD-10-CM | POA: Diagnosis not present

## 2023-04-01 DIAGNOSIS — M5386 Other specified dorsopathies, lumbar region: Secondary | ICD-10-CM | POA: Diagnosis not present

## 2023-04-01 DIAGNOSIS — M5136 Other intervertebral disc degeneration, lumbar region with discogenic back pain only: Secondary | ICD-10-CM | POA: Diagnosis not present

## 2023-04-01 DIAGNOSIS — M9905 Segmental and somatic dysfunction of pelvic region: Secondary | ICD-10-CM | POA: Diagnosis not present

## 2023-04-01 DIAGNOSIS — M9903 Segmental and somatic dysfunction of lumbar region: Secondary | ICD-10-CM | POA: Diagnosis not present

## 2023-04-06 DIAGNOSIS — F39 Unspecified mood [affective] disorder: Secondary | ICD-10-CM | POA: Diagnosis not present

## 2023-04-07 DIAGNOSIS — G4733 Obstructive sleep apnea (adult) (pediatric): Secondary | ICD-10-CM | POA: Diagnosis not present

## 2023-04-08 DIAGNOSIS — M9903 Segmental and somatic dysfunction of lumbar region: Secondary | ICD-10-CM | POA: Diagnosis not present

## 2023-04-08 DIAGNOSIS — M5386 Other specified dorsopathies, lumbar region: Secondary | ICD-10-CM | POA: Diagnosis not present

## 2023-04-08 DIAGNOSIS — M5136 Other intervertebral disc degeneration, lumbar region with discogenic back pain only: Secondary | ICD-10-CM | POA: Diagnosis not present

## 2023-04-08 DIAGNOSIS — M9905 Segmental and somatic dysfunction of pelvic region: Secondary | ICD-10-CM | POA: Diagnosis not present

## 2023-04-10 DIAGNOSIS — Z113 Encounter for screening for infections with a predominantly sexual mode of transmission: Secondary | ICD-10-CM | POA: Diagnosis not present

## 2023-04-10 DIAGNOSIS — Z124 Encounter for screening for malignant neoplasm of cervix: Secondary | ICD-10-CM | POA: Diagnosis not present

## 2023-04-10 DIAGNOSIS — Z01419 Encounter for gynecological examination (general) (routine) without abnormal findings: Secondary | ICD-10-CM | POA: Diagnosis not present

## 2023-04-15 ENCOUNTER — Other Ambulatory Visit: Payer: Self-pay | Admitting: Physician Assistant

## 2023-04-15 DIAGNOSIS — G43009 Migraine without aura, not intractable, without status migrainosus: Secondary | ICD-10-CM

## 2023-04-16 DIAGNOSIS — M9905 Segmental and somatic dysfunction of pelvic region: Secondary | ICD-10-CM | POA: Diagnosis not present

## 2023-04-16 DIAGNOSIS — M9903 Segmental and somatic dysfunction of lumbar region: Secondary | ICD-10-CM | POA: Diagnosis not present

## 2023-04-16 DIAGNOSIS — M5386 Other specified dorsopathies, lumbar region: Secondary | ICD-10-CM | POA: Diagnosis not present

## 2023-04-20 DIAGNOSIS — F39 Unspecified mood [affective] disorder: Secondary | ICD-10-CM | POA: Diagnosis not present

## 2023-05-04 DIAGNOSIS — F39 Unspecified mood [affective] disorder: Secondary | ICD-10-CM | POA: Diagnosis not present

## 2023-05-05 DIAGNOSIS — M542 Cervicalgia: Secondary | ICD-10-CM | POA: Diagnosis not present

## 2023-05-05 DIAGNOSIS — M791 Myalgia, unspecified site: Secondary | ICD-10-CM | POA: Diagnosis not present

## 2023-05-05 DIAGNOSIS — G43719 Chronic migraine without aura, intractable, without status migrainosus: Secondary | ICD-10-CM | POA: Diagnosis not present

## 2023-05-05 DIAGNOSIS — G518 Other disorders of facial nerve: Secondary | ICD-10-CM | POA: Diagnosis not present

## 2023-05-11 DIAGNOSIS — G43009 Migraine without aura, not intractable, without status migrainosus: Secondary | ICD-10-CM | POA: Diagnosis not present

## 2023-05-13 DIAGNOSIS — M5386 Other specified dorsopathies, lumbar region: Secondary | ICD-10-CM | POA: Diagnosis not present

## 2023-05-13 DIAGNOSIS — M9903 Segmental and somatic dysfunction of lumbar region: Secondary | ICD-10-CM | POA: Diagnosis not present

## 2023-05-13 DIAGNOSIS — M9905 Segmental and somatic dysfunction of pelvic region: Secondary | ICD-10-CM | POA: Diagnosis not present

## 2023-05-13 DIAGNOSIS — M5136 Other intervertebral disc degeneration, lumbar region with discogenic back pain only: Secondary | ICD-10-CM | POA: Diagnosis not present

## 2023-05-19 DIAGNOSIS — M9905 Segmental and somatic dysfunction of pelvic region: Secondary | ICD-10-CM | POA: Diagnosis not present

## 2023-05-19 DIAGNOSIS — M5386 Other specified dorsopathies, lumbar region: Secondary | ICD-10-CM | POA: Diagnosis not present

## 2023-05-19 DIAGNOSIS — M9903 Segmental and somatic dysfunction of lumbar region: Secondary | ICD-10-CM | POA: Diagnosis not present

## 2023-05-19 DIAGNOSIS — M5136 Other intervertebral disc degeneration, lumbar region with discogenic back pain only: Secondary | ICD-10-CM | POA: Diagnosis not present

## 2023-05-20 DIAGNOSIS — F39 Unspecified mood [affective] disorder: Secondary | ICD-10-CM | POA: Diagnosis not present

## 2023-05-23 ENCOUNTER — Other Ambulatory Visit: Payer: Self-pay | Admitting: Physician Assistant

## 2023-05-23 DIAGNOSIS — K219 Gastro-esophageal reflux disease without esophagitis: Secondary | ICD-10-CM

## 2023-06-02 DIAGNOSIS — M791 Myalgia, unspecified site: Secondary | ICD-10-CM | POA: Diagnosis not present

## 2023-06-02 DIAGNOSIS — M542 Cervicalgia: Secondary | ICD-10-CM | POA: Diagnosis not present

## 2023-06-02 DIAGNOSIS — G518 Other disorders of facial nerve: Secondary | ICD-10-CM | POA: Diagnosis not present

## 2023-06-02 DIAGNOSIS — G43719 Chronic migraine without aura, intractable, without status migrainosus: Secondary | ICD-10-CM | POA: Diagnosis not present

## 2023-06-15 DIAGNOSIS — F341 Dysthymic disorder: Secondary | ICD-10-CM | POA: Diagnosis not present

## 2023-06-22 DIAGNOSIS — G4723 Circadian rhythm sleep disorder, irregular sleep wake type: Secondary | ICD-10-CM | POA: Diagnosis not present

## 2023-06-22 DIAGNOSIS — F340 Cyclothymic disorder: Secondary | ICD-10-CM | POA: Diagnosis not present

## 2023-06-22 DIAGNOSIS — F411 Generalized anxiety disorder: Secondary | ICD-10-CM | POA: Diagnosis not present

## 2023-06-22 DIAGNOSIS — G4730 Sleep apnea, unspecified: Secondary | ICD-10-CM | POA: Diagnosis not present

## 2023-06-29 DIAGNOSIS — F341 Dysthymic disorder: Secondary | ICD-10-CM | POA: Diagnosis not present

## 2023-07-01 DIAGNOSIS — M791 Myalgia, unspecified site: Secondary | ICD-10-CM | POA: Diagnosis not present

## 2023-07-01 DIAGNOSIS — G518 Other disorders of facial nerve: Secondary | ICD-10-CM | POA: Diagnosis not present

## 2023-07-01 DIAGNOSIS — M542 Cervicalgia: Secondary | ICD-10-CM | POA: Diagnosis not present

## 2023-07-01 DIAGNOSIS — G43719 Chronic migraine without aura, intractable, without status migrainosus: Secondary | ICD-10-CM | POA: Diagnosis not present

## 2023-07-09 DIAGNOSIS — M9905 Segmental and somatic dysfunction of pelvic region: Secondary | ICD-10-CM | POA: Diagnosis not present

## 2023-07-09 DIAGNOSIS — M5386 Other specified dorsopathies, lumbar region: Secondary | ICD-10-CM | POA: Diagnosis not present

## 2023-07-09 DIAGNOSIS — M9903 Segmental and somatic dysfunction of lumbar region: Secondary | ICD-10-CM | POA: Diagnosis not present

## 2023-07-13 DIAGNOSIS — F341 Dysthymic disorder: Secondary | ICD-10-CM | POA: Diagnosis not present

## 2023-07-16 DIAGNOSIS — F411 Generalized anxiety disorder: Secondary | ICD-10-CM | POA: Diagnosis not present

## 2023-07-16 DIAGNOSIS — M9903 Segmental and somatic dysfunction of lumbar region: Secondary | ICD-10-CM | POA: Diagnosis not present

## 2023-07-16 DIAGNOSIS — F902 Attention-deficit hyperactivity disorder, combined type: Secondary | ICD-10-CM | POA: Diagnosis not present

## 2023-07-16 DIAGNOSIS — M5386 Other specified dorsopathies, lumbar region: Secondary | ICD-10-CM | POA: Diagnosis not present

## 2023-07-16 DIAGNOSIS — G4730 Sleep apnea, unspecified: Secondary | ICD-10-CM | POA: Diagnosis not present

## 2023-07-16 DIAGNOSIS — M9905 Segmental and somatic dysfunction of pelvic region: Secondary | ICD-10-CM | POA: Diagnosis not present

## 2023-07-16 DIAGNOSIS — F340 Cyclothymic disorder: Secondary | ICD-10-CM | POA: Diagnosis not present

## 2023-07-18 ENCOUNTER — Other Ambulatory Visit: Payer: Self-pay | Admitting: Physician Assistant

## 2023-07-18 DIAGNOSIS — G43009 Migraine without aura, not intractable, without status migrainosus: Secondary | ICD-10-CM

## 2023-07-22 DIAGNOSIS — M5386 Other specified dorsopathies, lumbar region: Secondary | ICD-10-CM | POA: Diagnosis not present

## 2023-07-22 DIAGNOSIS — M9903 Segmental and somatic dysfunction of lumbar region: Secondary | ICD-10-CM | POA: Diagnosis not present

## 2023-07-22 DIAGNOSIS — M5136 Other intervertebral disc degeneration, lumbar region with discogenic back pain only: Secondary | ICD-10-CM | POA: Diagnosis not present

## 2023-07-22 DIAGNOSIS — N390 Urinary tract infection, site not specified: Secondary | ICD-10-CM | POA: Diagnosis not present

## 2023-07-22 DIAGNOSIS — M9905 Segmental and somatic dysfunction of pelvic region: Secondary | ICD-10-CM | POA: Diagnosis not present

## 2023-07-22 DIAGNOSIS — R350 Frequency of micturition: Secondary | ICD-10-CM | POA: Diagnosis not present

## 2023-07-29 ENCOUNTER — Encounter (HOSPITAL_COMMUNITY): Payer: Self-pay | Admitting: Obstetrics and Gynecology

## 2023-07-29 NOTE — Progress Notes (Addendum)
 Spoke w/ via phone for pre-op interview--- pt Lab needs dos----    no (per anes)     Lab results------ no COVID test -----patient states asymptomatic no test needed Arrive at ------- 1400 on 07-31-2023 NPO after MN NO Solid Food.  Clear liquids from MN until--- 1300 Pre-Surgery Ensure or G2: n/a  Med rec completed Medications to take morning of surgery ----- prilosec/  if needed may take prn meds for migraines Diabetic medication ----- n/a  GLP1 agonist last dose: n/a GLP1 instructions:  Patient instructed no nail polish to be worn day of surgery Patient instructed to bring photo id and insurance card day of surgery Patient aware to have Driver (ride ) / caregiver    for 24 hours after surgery - mother, cheri greer Patient Special Instructions ----- soap shower day of surgery Pre-Op special Instructions ----- case just posted today, pre-op orders pending Pt stated she has a lip piercing that she can take out herself but she prefers not due to it will close up very quickly.  Pt was advised and told reasons why anesthesia wants the piercing removed due to issues with airway.  Pt verbalized understanding but insist she wants to leave it in.  Informed patient she can speak with anesthesia but they will insist she take it out.  She also has permanent clitoral piercing.   Patient verbalized understanding of instructions that were given at this phone interview. Patient denies chest pain, sob, fever, cough at the interview.

## 2023-07-30 DIAGNOSIS — E66813 Obesity, class 3: Secondary | ICD-10-CM | POA: Diagnosis not present

## 2023-07-30 DIAGNOSIS — Z9884 Bariatric surgery status: Secondary | ICD-10-CM | POA: Diagnosis not present

## 2023-07-30 DIAGNOSIS — K219 Gastro-esophageal reflux disease without esophagitis: Secondary | ICD-10-CM | POA: Diagnosis not present

## 2023-07-30 DIAGNOSIS — Z6841 Body Mass Index (BMI) 40.0 and over, adult: Secondary | ICD-10-CM | POA: Diagnosis not present

## 2023-07-30 DIAGNOSIS — F341 Dysthymic disorder: Secondary | ICD-10-CM | POA: Diagnosis not present

## 2023-07-31 ENCOUNTER — Ambulatory Visit (HOSPITAL_COMMUNITY): Admission: RE | Admit: 2023-07-31 | Source: Home / Self Care | Admitting: Obstetrics and Gynecology

## 2023-07-31 ENCOUNTER — Encounter (HOSPITAL_COMMUNITY): Admission: RE | Payer: Self-pay | Source: Home / Self Care

## 2023-07-31 DIAGNOSIS — Z01818 Encounter for other preprocedural examination: Secondary | ICD-10-CM

## 2023-07-31 HISTORY — DX: Obstructive sleep apnea (adult) (pediatric): G47.33

## 2023-07-31 HISTORY — DX: Personal history of diseases of the blood and blood-forming organs and certain disorders involving the immune mechanism: Z86.2

## 2023-07-31 HISTORY — DX: Other hypersomnia: G47.19

## 2023-07-31 HISTORY — DX: Psychophysiologic insomnia: F51.04

## 2023-07-31 HISTORY — DX: Migraine, unspecified, not intractable, without status migrainosus: G43.909

## 2023-07-31 HISTORY — DX: Diaphragmatic hernia without obstruction or gangrene: K44.9

## 2023-07-31 HISTORY — DX: Attention-deficit hyperactivity disorder, unspecified type: F90.9

## 2023-07-31 HISTORY — DX: Major depressive disorder, single episode, unspecified: F32.9

## 2023-07-31 HISTORY — DX: Personal history of peptic ulcer disease: Z87.11

## 2023-07-31 HISTORY — DX: Irritable bowel syndrome with diarrhea: K58.0

## 2023-07-31 HISTORY — DX: Other mechanical complication of intrauterine contraceptive device, initial encounter: T83.39XA

## 2023-07-31 SURGERY — REMOVAL, INTRAUTERINE DEVICE
Anesthesia: General

## 2023-08-03 DIAGNOSIS — H10211 Acute toxic conjunctivitis, right eye: Secondary | ICD-10-CM | POA: Diagnosis not present

## 2023-08-03 DIAGNOSIS — H52223 Regular astigmatism, bilateral: Secondary | ICD-10-CM | POA: Diagnosis not present

## 2023-08-04 DIAGNOSIS — M542 Cervicalgia: Secondary | ICD-10-CM | POA: Diagnosis not present

## 2023-08-04 DIAGNOSIS — G43719 Chronic migraine without aura, intractable, without status migrainosus: Secondary | ICD-10-CM | POA: Diagnosis not present

## 2023-08-04 DIAGNOSIS — M9905 Segmental and somatic dysfunction of pelvic region: Secondary | ICD-10-CM | POA: Diagnosis not present

## 2023-08-04 DIAGNOSIS — Z713 Dietary counseling and surveillance: Secondary | ICD-10-CM | POA: Diagnosis not present

## 2023-08-04 DIAGNOSIS — Z9884 Bariatric surgery status: Secondary | ICD-10-CM | POA: Diagnosis not present

## 2023-08-04 DIAGNOSIS — G518 Other disorders of facial nerve: Secondary | ICD-10-CM | POA: Diagnosis not present

## 2023-08-04 DIAGNOSIS — M791 Myalgia, unspecified site: Secondary | ICD-10-CM | POA: Diagnosis not present

## 2023-08-04 DIAGNOSIS — M9903 Segmental and somatic dysfunction of lumbar region: Secondary | ICD-10-CM | POA: Diagnosis not present

## 2023-08-04 DIAGNOSIS — M5386 Other specified dorsopathies, lumbar region: Secondary | ICD-10-CM | POA: Diagnosis not present

## 2023-08-10 DIAGNOSIS — F341 Dysthymic disorder: Secondary | ICD-10-CM | POA: Diagnosis not present

## 2023-08-16 ENCOUNTER — Other Ambulatory Visit: Payer: Self-pay | Admitting: Physician Assistant

## 2023-08-16 DIAGNOSIS — K219 Gastro-esophageal reflux disease without esophagitis: Secondary | ICD-10-CM

## 2023-08-17 DIAGNOSIS — Z30432 Encounter for removal of intrauterine contraceptive device: Secondary | ICD-10-CM | POA: Diagnosis not present

## 2023-08-17 DIAGNOSIS — Z30430 Encounter for insertion of intrauterine contraceptive device: Secondary | ICD-10-CM | POA: Diagnosis not present

## 2023-08-24 DIAGNOSIS — K219 Gastro-esophageal reflux disease without esophagitis: Secondary | ICD-10-CM | POA: Diagnosis not present

## 2023-08-24 DIAGNOSIS — Z9884 Bariatric surgery status: Secondary | ICD-10-CM | POA: Diagnosis not present

## 2023-08-24 DIAGNOSIS — K449 Diaphragmatic hernia without obstruction or gangrene: Secondary | ICD-10-CM | POA: Diagnosis not present

## 2023-08-25 ENCOUNTER — Other Ambulatory Visit: Payer: Self-pay | Admitting: Physician Assistant

## 2023-08-25 ENCOUNTER — Other Ambulatory Visit: Payer: Self-pay | Admitting: *Deleted

## 2023-08-25 DIAGNOSIS — G43009 Migraine without aura, not intractable, without status migrainosus: Secondary | ICD-10-CM

## 2023-08-26 DIAGNOSIS — F341 Dysthymic disorder: Secondary | ICD-10-CM | POA: Diagnosis not present

## 2023-08-31 DIAGNOSIS — Z6841 Body Mass Index (BMI) 40.0 and over, adult: Secondary | ICD-10-CM | POA: Diagnosis not present

## 2023-08-31 DIAGNOSIS — Z713 Dietary counseling and surveillance: Secondary | ICD-10-CM | POA: Diagnosis not present

## 2023-08-31 DIAGNOSIS — E669 Obesity, unspecified: Secondary | ICD-10-CM | POA: Diagnosis not present

## 2023-08-31 DIAGNOSIS — Z9884 Bariatric surgery status: Secondary | ICD-10-CM | POA: Diagnosis not present

## 2023-09-01 DIAGNOSIS — F411 Generalized anxiety disorder: Secondary | ICD-10-CM | POA: Diagnosis not present

## 2023-09-01 DIAGNOSIS — G4723 Circadian rhythm sleep disorder, irregular sleep wake type: Secondary | ICD-10-CM | POA: Diagnosis not present

## 2023-09-01 DIAGNOSIS — F902 Attention-deficit hyperactivity disorder, combined type: Secondary | ICD-10-CM | POA: Diagnosis not present

## 2023-09-01 DIAGNOSIS — F340 Cyclothymic disorder: Secondary | ICD-10-CM | POA: Diagnosis not present

## 2023-09-02 DIAGNOSIS — F341 Dysthymic disorder: Secondary | ICD-10-CM | POA: Diagnosis not present

## 2023-09-09 DIAGNOSIS — F341 Dysthymic disorder: Secondary | ICD-10-CM | POA: Diagnosis not present

## 2023-09-10 DIAGNOSIS — F902 Attention-deficit hyperactivity disorder, combined type: Secondary | ICD-10-CM | POA: Diagnosis not present

## 2023-09-10 DIAGNOSIS — F332 Major depressive disorder, recurrent severe without psychotic features: Secondary | ICD-10-CM | POA: Diagnosis not present

## 2023-09-10 DIAGNOSIS — G4723 Circadian rhythm sleep disorder, irregular sleep wake type: Secondary | ICD-10-CM | POA: Diagnosis not present

## 2023-09-10 DIAGNOSIS — F411 Generalized anxiety disorder: Secondary | ICD-10-CM | POA: Diagnosis not present

## 2023-09-14 DIAGNOSIS — H6993 Unspecified Eustachian tube disorder, bilateral: Secondary | ICD-10-CM | POA: Diagnosis not present

## 2023-09-14 DIAGNOSIS — H90A32 Mixed conductive and sensorineural hearing loss, unilateral, left ear with restricted hearing on the contralateral side: Secondary | ICD-10-CM | POA: Diagnosis not present

## 2023-09-14 DIAGNOSIS — H903 Sensorineural hearing loss, bilateral: Secondary | ICD-10-CM | POA: Diagnosis not present

## 2023-09-14 DIAGNOSIS — H7292 Unspecified perforation of tympanic membrane, left ear: Secondary | ICD-10-CM | POA: Diagnosis not present

## 2023-09-23 DIAGNOSIS — F341 Dysthymic disorder: Secondary | ICD-10-CM | POA: Diagnosis not present

## 2023-10-09 DIAGNOSIS — F341 Dysthymic disorder: Secondary | ICD-10-CM | POA: Diagnosis not present

## 2023-10-12 DIAGNOSIS — F332 Major depressive disorder, recurrent severe without psychotic features: Secondary | ICD-10-CM | POA: Diagnosis not present

## 2023-10-12 DIAGNOSIS — F411 Generalized anxiety disorder: Secondary | ICD-10-CM | POA: Diagnosis not present

## 2023-10-12 DIAGNOSIS — G4723 Circadian rhythm sleep disorder, irregular sleep wake type: Secondary | ICD-10-CM | POA: Diagnosis not present

## 2023-10-12 DIAGNOSIS — E669 Obesity, unspecified: Secondary | ICD-10-CM | POA: Diagnosis not present

## 2023-10-12 DIAGNOSIS — Z713 Dietary counseling and surveillance: Secondary | ICD-10-CM | POA: Diagnosis not present

## 2023-10-12 DIAGNOSIS — Z9884 Bariatric surgery status: Secondary | ICD-10-CM | POA: Diagnosis not present

## 2023-10-12 DIAGNOSIS — F902 Attention-deficit hyperactivity disorder, combined type: Secondary | ICD-10-CM | POA: Diagnosis not present

## 2023-10-20 DIAGNOSIS — F332 Major depressive disorder, recurrent severe without psychotic features: Secondary | ICD-10-CM | POA: Diagnosis not present

## 2023-10-21 DIAGNOSIS — F332 Major depressive disorder, recurrent severe without psychotic features: Secondary | ICD-10-CM | POA: Diagnosis not present

## 2023-10-22 DIAGNOSIS — F332 Major depressive disorder, recurrent severe without psychotic features: Secondary | ICD-10-CM | POA: Diagnosis not present

## 2023-10-26 DIAGNOSIS — F332 Major depressive disorder, recurrent severe without psychotic features: Secondary | ICD-10-CM | POA: Diagnosis not present

## 2023-10-27 DIAGNOSIS — F332 Major depressive disorder, recurrent severe without psychotic features: Secondary | ICD-10-CM | POA: Diagnosis not present

## 2023-10-28 DIAGNOSIS — Z6841 Body Mass Index (BMI) 40.0 and over, adult: Secondary | ICD-10-CM | POA: Diagnosis not present

## 2023-10-28 DIAGNOSIS — E66813 Obesity, class 3: Secondary | ICD-10-CM | POA: Diagnosis not present

## 2023-10-28 DIAGNOSIS — Z9884 Bariatric surgery status: Secondary | ICD-10-CM | POA: Diagnosis not present

## 2023-10-28 DIAGNOSIS — F332 Major depressive disorder, recurrent severe without psychotic features: Secondary | ICD-10-CM | POA: Diagnosis not present

## 2023-10-29 DIAGNOSIS — F332 Major depressive disorder, recurrent severe without psychotic features: Secondary | ICD-10-CM | POA: Diagnosis not present

## 2023-11-02 DIAGNOSIS — F341 Dysthymic disorder: Secondary | ICD-10-CM | POA: Diagnosis not present

## 2023-11-02 DIAGNOSIS — F332 Major depressive disorder, recurrent severe without psychotic features: Secondary | ICD-10-CM | POA: Diagnosis not present

## 2023-11-03 DIAGNOSIS — F332 Major depressive disorder, recurrent severe without psychotic features: Secondary | ICD-10-CM | POA: Diagnosis not present

## 2023-11-06 DIAGNOSIS — F332 Major depressive disorder, recurrent severe without psychotic features: Secondary | ICD-10-CM | POA: Diagnosis not present

## 2023-11-10 DIAGNOSIS — F332 Major depressive disorder, recurrent severe without psychotic features: Secondary | ICD-10-CM | POA: Diagnosis not present

## 2023-11-11 DIAGNOSIS — F332 Major depressive disorder, recurrent severe without psychotic features: Secondary | ICD-10-CM | POA: Diagnosis not present

## 2023-11-16 DIAGNOSIS — F332 Major depressive disorder, recurrent severe without psychotic features: Secondary | ICD-10-CM | POA: Diagnosis not present

## 2023-11-17 DIAGNOSIS — F332 Major depressive disorder, recurrent severe without psychotic features: Secondary | ICD-10-CM | POA: Diagnosis not present

## 2023-11-18 DIAGNOSIS — F332 Major depressive disorder, recurrent severe without psychotic features: Secondary | ICD-10-CM | POA: Diagnosis not present

## 2023-11-19 DIAGNOSIS — F332 Major depressive disorder, recurrent severe without psychotic features: Secondary | ICD-10-CM | POA: Diagnosis not present

## 2023-11-24 DIAGNOSIS — F332 Major depressive disorder, recurrent severe without psychotic features: Secondary | ICD-10-CM | POA: Diagnosis not present

## 2023-11-25 DIAGNOSIS — F332 Major depressive disorder, recurrent severe without psychotic features: Secondary | ICD-10-CM | POA: Diagnosis not present

## 2023-11-26 DIAGNOSIS — F332 Major depressive disorder, recurrent severe without psychotic features: Secondary | ICD-10-CM | POA: Diagnosis not present

## 2023-11-30 DIAGNOSIS — F341 Dysthymic disorder: Secondary | ICD-10-CM | POA: Diagnosis not present

## 2023-11-30 DIAGNOSIS — R5381 Other malaise: Secondary | ICD-10-CM | POA: Diagnosis not present

## 2023-11-30 DIAGNOSIS — J029 Acute pharyngitis, unspecified: Secondary | ICD-10-CM | POA: Diagnosis not present

## 2023-11-30 DIAGNOSIS — R0981 Nasal congestion: Secondary | ICD-10-CM | POA: Diagnosis not present

## 2023-11-30 DIAGNOSIS — R051 Acute cough: Secondary | ICD-10-CM | POA: Diagnosis not present

## 2023-12-03 DIAGNOSIS — F332 Major depressive disorder, recurrent severe without psychotic features: Secondary | ICD-10-CM | POA: Diagnosis not present

## 2023-12-09 DIAGNOSIS — F332 Major depressive disorder, recurrent severe without psychotic features: Secondary | ICD-10-CM | POA: Diagnosis not present

## 2023-12-10 DIAGNOSIS — F332 Major depressive disorder, recurrent severe without psychotic features: Secondary | ICD-10-CM | POA: Diagnosis not present

## 2023-12-11 DIAGNOSIS — F332 Major depressive disorder, recurrent severe without psychotic features: Secondary | ICD-10-CM | POA: Diagnosis not present

## 2023-12-14 DIAGNOSIS — F341 Dysthymic disorder: Secondary | ICD-10-CM | POA: Diagnosis not present

## 2023-12-14 DIAGNOSIS — F332 Major depressive disorder, recurrent severe without psychotic features: Secondary | ICD-10-CM | POA: Diagnosis not present

## 2023-12-18 DIAGNOSIS — G4733 Obstructive sleep apnea (adult) (pediatric): Secondary | ICD-10-CM | POA: Diagnosis not present

## 2023-12-21 DIAGNOSIS — F332 Major depressive disorder, recurrent severe without psychotic features: Secondary | ICD-10-CM | POA: Diagnosis not present

## 2023-12-23 DIAGNOSIS — F332 Major depressive disorder, recurrent severe without psychotic features: Secondary | ICD-10-CM | POA: Diagnosis not present

## 2023-12-24 DIAGNOSIS — F332 Major depressive disorder, recurrent severe without psychotic features: Secondary | ICD-10-CM | POA: Diagnosis not present

## 2023-12-28 DIAGNOSIS — F341 Dysthymic disorder: Secondary | ICD-10-CM | POA: Diagnosis not present

## 2023-12-28 DIAGNOSIS — F332 Major depressive disorder, recurrent severe without psychotic features: Secondary | ICD-10-CM | POA: Diagnosis not present

## 2023-12-31 DIAGNOSIS — F332 Major depressive disorder, recurrent severe without psychotic features: Secondary | ICD-10-CM | POA: Diagnosis not present

## 2024-01-01 DIAGNOSIS — F332 Major depressive disorder, recurrent severe without psychotic features: Secondary | ICD-10-CM | POA: Diagnosis not present

## 2024-01-04 DIAGNOSIS — F332 Major depressive disorder, recurrent severe without psychotic features: Secondary | ICD-10-CM | POA: Diagnosis not present

## 2024-01-06 ENCOUNTER — Other Ambulatory Visit: Payer: Self-pay

## 2024-01-06 DIAGNOSIS — K219 Gastro-esophageal reflux disease without esophagitis: Secondary | ICD-10-CM

## 2024-01-06 MED ORDER — OMEPRAZOLE 40 MG PO CPDR: 40.0000 mg | DELAYED_RELEASE_CAPSULE | Freq: Two times a day (BID) | ORAL | 0 refills | Status: AC

## 2024-01-06 NOTE — Telephone Encounter (Signed)
 Copied from CRM #[id number]. Topic: Clinical - Medication Question >> Jan 06, 2024  9:53 AM [name] wrote: Reason for CRM: Patient omeprazole  (PRILOSEC) 40 MG capsule needs to be refilled and she was unaware that her PC [name] was inactive, she currently has transferred care with [name] please give patient a call to further discuss her refill

## 2024-01-12 DIAGNOSIS — F332 Major depressive disorder, recurrent severe without psychotic features: Secondary | ICD-10-CM | POA: Diagnosis not present

## 2024-01-19 DIAGNOSIS — F332 Major depressive disorder, recurrent severe without psychotic features: Secondary | ICD-10-CM | POA: Diagnosis not present

## 2024-02-09 ENCOUNTER — Encounter: Payer: Self-pay | Admitting: Physician Assistant

## 2024-02-09 ENCOUNTER — Ambulatory Visit: Admitting: Physician Assistant

## 2024-02-09 VITALS — BP 91/60 | HR 68 | Temp 97.8°F | Resp 16 | Wt 277.4 lb

## 2024-02-09 DIAGNOSIS — F909 Attention-deficit hyperactivity disorder, unspecified type: Secondary | ICD-10-CM

## 2024-02-09 DIAGNOSIS — Z8669 Personal history of other diseases of the nervous system and sense organs: Secondary | ICD-10-CM

## 2024-02-09 DIAGNOSIS — G43009 Migraine without aura, not intractable, without status migrainosus: Secondary | ICD-10-CM

## 2024-02-09 DIAGNOSIS — Z6841 Body Mass Index (BMI) 40.0 and over, adult: Secondary | ICD-10-CM | POA: Diagnosis not present

## 2024-02-09 DIAGNOSIS — K219 Gastro-esophageal reflux disease without esophagitis: Secondary | ICD-10-CM

## 2024-02-09 DIAGNOSIS — J383 Other diseases of vocal cords: Secondary | ICD-10-CM

## 2024-02-09 DIAGNOSIS — Z23 Encounter for immunization: Secondary | ICD-10-CM

## 2024-02-09 LAB — CBC WITH DIFFERENTIAL/PLATELET
Basophils Absolute: 0 K/uL (ref 0.0–0.1)
Basophils Relative: 0.4 % (ref 0.0–3.0)
Eosinophils Absolute: 0.1 K/uL (ref 0.0–0.7)
Eosinophils Relative: 1.2 % (ref 0.0–5.0)
HCT: 38.2 % (ref 36.0–46.0)
Hemoglobin: 13.1 g/dL (ref 12.0–15.0)
Lymphocytes Relative: 21.8 % (ref 12.0–46.0)
Lymphs Abs: 1.4 K/uL (ref 0.7–4.0)
MCHC: 34.2 g/dL (ref 30.0–36.0)
MCV: 85.7 fl (ref 78.0–100.0)
Monocytes Absolute: 0.4 K/uL (ref 0.1–1.0)
Monocytes Relative: 6.3 % (ref 3.0–12.0)
Neutro Abs: 4.4 K/uL (ref 1.4–7.7)
Neutrophils Relative %: 70.3 % (ref 43.0–77.0)
Platelets: 208 K/uL (ref 150.0–400.0)
RBC: 4.46 Mil/uL (ref 3.87–5.11)
RDW: 13.9 % (ref 11.5–15.5)
WBC: 6.2 K/uL (ref 4.0–10.5)

## 2024-02-09 LAB — COMPREHENSIVE METABOLIC PANEL WITH GFR
ALT: 18 U/L (ref 3–35)
AST: 14 U/L (ref 5–37)
Albumin: 4.1 g/dL (ref 3.5–5.2)
Alkaline Phosphatase: 157 U/L — ABNORMAL HIGH (ref 39–117)
BUN: 12 mg/dL (ref 6–23)
CO2: 30 meq/L (ref 19–32)
Calcium: 8.9 mg/dL (ref 8.4–10.5)
Chloride: 105 meq/L (ref 96–112)
Creatinine, Ser: 0.58 mg/dL (ref 0.40–1.20)
GFR: 112.6 mL/min
Glucose, Bld: 66 mg/dL — ABNORMAL LOW (ref 70–99)
Potassium: 3.9 meq/L (ref 3.5–5.1)
Sodium: 139 meq/L (ref 135–145)
Total Bilirubin: 0.6 mg/dL (ref 0.2–1.2)
Total Protein: 6.5 g/dL (ref 6.0–8.3)

## 2024-02-09 LAB — LIPID PANEL
Cholesterol: 135 mg/dL (ref 28–200)
HDL: 34.4 mg/dL — ABNORMAL LOW
LDL Cholesterol: 84 mg/dL (ref 10–99)
NonHDL: 100.64
Total CHOL/HDL Ratio: 4
Triglycerides: 81 mg/dL (ref 10.0–149.0)
VLDL: 16.2 mg/dL (ref 0.0–40.0)

## 2024-02-09 LAB — HEMOGLOBIN A1C: Hgb A1c MFr Bld: 5 % (ref 4.6–6.5)

## 2024-02-09 MED ORDER — NARATRIPTAN HCL 2.5 MG PO TABS: 2.5000 mg | ORAL_TABLET | ORAL | 1 refills | Status: AC | PRN

## 2024-02-09 NOTE — Assessment & Plan Note (Signed)
-   States that Triptans work well, but only takes sparingly due to limited prescription quantity [facility]. - She instead relies on combination of tramadol  and baclofen  for primary management which she had been getting from her former primary care provider. Evidently the origins of this treatment plan was self-directed by patient.   - Referred to neurology for comprehensive migraine management. - Will prescribe additional naratriptan  for acute management.

## 2024-02-09 NOTE — Assessment & Plan Note (Signed)
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 On omeprazole  40 mg twice daily. Unable to use H2 blockers due to vocal cord dysfunction. - Contact bariatric team to discuss potential adjustment of Wegovy. - Continue omeprazole  40 mg twice daily.

## 2024-02-09 NOTE — Assessment & Plan Note (Signed)
-   History of sleeve gastrectomy in 2022. On Wegovy for weight management. Her peak weight was 390 lbs. - Continue follow-up with bariatric team for weight management and potential adjustment of Wegovy due to GI effects.

## 2024-02-09 NOTE — Progress Notes (Signed)
 "  New Patient Office Visit  Subjective    Patient ID: Claudia Walsh, female    DOB: 04-06-1980  Age: 44 y.o. MRN: [id number]  CC:  Chief Complaint  Patient presents with   Establish Care   Gastroesophageal Reflux    Omeprazole  not working   Discussed the use of AI scribe software for clinical note transcription with the patient, who gave verbal consent to proceed.  History of Present Illness Claudia Walsh is a 44 year old female who presents to establish care with a new provider.  She has a history of morbid obesity and underwent sleeve gastrectomy in 2022. She is currently on Wegovy and is followed by a weight loss team at [facility]. Her GERD has worsened in the last week while on Wegovy leading to frequent use of Tums. She cannot take famotidine  as it triggers her vocal cord dysfunction. She is on omeprazole  40 mg twice daily.  She has chronic vocal cord dysfunction since age 26, exacerbated by medications including H2 blockers like famotidine  and ranitidine , antihistamines like cetirizine, and Tylenol . These medications cause difficulty breathing, described as 'feeling like I can't get enough air.' She no longer requires speech therapy as she has identified and avoids these triggers, using breathing techniques learned in therapy to manage occasional attacks.  She has a migraine disorder and has been prescribed tramadol  in the past. She also uses naratriptan  (Amerge) for migraines, prescribed by Dr. Layman Meissner at [facility], but her insurance no longer covers visits there. Patient had been taking combination of baclofen  and tramadol  for her headaches previously which was self-directed.  Her previous primary care provider had been prescribing those medications since Dr. Meissner would not.    She reports a significant weight loss journey, having lost 120 pounds from a peak weight of 397 pounds. She  initially lost 150 pounds but regained 40 pounds after starting a medication, she believes it was Strattera which she took for ADHD. She now uses methylphenidate as needed, prescribed by the [facility].     Outpatient Encounter Medications as of 02/09/2024  Medication Sig   baclofen  (LIORESAL ) 10 MG tablet TAKE 1 TABLET BY MOUTH THREE TIMES A DAY AS NEEDED FOR MUSCLE SPASM   Cholecalciferol (VITAMIN D3) 250 MCG (10000 UT) capsule Take 10,000 Units by mouth at bedtime.   gabapentin (NEURONTIN) 300 MG capsule Take 300 mg by mouth at bedtime.   hydrOXYzine (VISTARIL) 25 MG capsule Take 50 mg by mouth at bedtime.   ibuprofen  (ADVIL ) 200 MG tablet Take 600 mg by mouth every 6 (six) hours as needed.   Magnesium  Oxide -Mg Supplement 500 MG CAPS Take 1 capsule by mouth at bedtime.   methylphenidate 27 MG PO TB24 TAKE 1 BY MOUTH EVERY DAY IN THE MORNING   Multiple Vitamins-Minerals (MULTIVITAMIN WOMEN PO) Take 1 tablet by mouth daily.   omeprazole  (PRILOSEC) 40 MG capsule Take 1 capsule (40 mg total) by mouth 2 (two) times daily.   ondansetron  (ZOFRAN -ODT) 4 MG disintegrating tablet Take 4 mg by mouth every 6 (six) hours as needed for nausea or vomiting.   oxymetazoline (AFRIN) 0.05 % nasal spray Place 1 spray into both nostrils 2 (two) times daily.   traMADol  (ULTRAM ) 50 MG tablet Take 50 mg by mouth every 6 (six) hours as needed.   WEGOVY 1.7 MG/0.75ML SOAJ SQ injection Inject 1.7 mg into the skin once a week.   [DISCONTINUED]  naratriptan  (AMERGE) 2.5 MG tablet Take 2.5 mg by mouth as needed.   acyclovir (ZOVIRAX) 400 MG tablet Take 400 mg by mouth daily as needed (cold sores).   levonorgestrel  (MIRENA ) 20 MCG/DAY IUD See admin instructions.   naratriptan  (AMERGE) 2.5 MG tablet Take 1 tablet (2.5 mg total) by mouth as needed.   ramelteon (ROZEREM) 8 MG tablet Take 8 mg by mouth at bedtime.   [DISCONTINUED] acetaminophen  (TYLENOL ) 500 MG tablet Take 500 mg by mouth every 6 (six) hours as  needed for moderate pain or headache.   [DISCONTINUED] Armodafinil 200 MG TABS Take 1 tablet by mouth daily.   [DISCONTINUED] Armodafinil 200 MG TABS Take 1 tablet by mouth daily as needed (in AM if needed for excessive sleepiness).   [DISCONTINUED] atomoxetine (STRATTERA) 10 MG capsule Take 10 mg by mouth at bedtime.   [DISCONTINUED] chlorproMAZINE (THORAZINE) 25 MG tablet Take 25-50 mg by mouth 2 (two) times daily as needed (1-2 TABS EVERY 2-3 HOURS UP TO TO MORE THAN 6 TABS PER DAY).   [DISCONTINUED] diclofenac (VOLTAREN) 75 MG EC tablet Take 75 mg by mouth 2 (two) times daily.   [DISCONTINUED] famotidine  (PEPCID ) 40 MG tablet Take 1 tablet (40 mg total) by mouth at bedtime.   [DISCONTINUED] fluticasone  (FLONASE ) 50 MCG/ACT nasal spray Place 2 sprays into both nostrils at bedtime.   [DISCONTINUED] gabapentin (NEURONTIN) 300 MG capsule Take 600 mg by mouth at bedtime.   [DISCONTINUED] hydrOXYzine (ATARAX) 50 MG tablet Take 50 mg by mouth at bedtime.   [DISCONTINUED] levonorgestrel  (MIRENA ) 20 MCG/DAY IUD 1 each by Intrauterine route once. Inserted approx 2018   [DISCONTINUED] LURASIDONE HCL PO Take 25 mg by mouth at bedtime.   [DISCONTINUED] magnesium  30 MG tablet Take 500 mg by mouth daily.   [DISCONTINUED] montelukast  (SINGULAIR ) 10 MG tablet Take 1 tablet (10 mg total) by mouth at bedtime.   [DISCONTINUED] NON FORMULARY Ibguard 1 tab 2 x day   [DISCONTINUED] ondansetron  (ZOFRAN -ODT) 4 MG disintegrating tablet Take 1 tablet (4 mg total) by mouth every 8 (eight) hours as needed for nausea.   [DISCONTINUED] PEPPERMINT OIL PO Take 90 mg by mouth in the morning and at bedtime.   [DISCONTINUED] ramelteon (ROZEREM) 8 MG tablet Take 8 mg by mouth at bedtime.   [DISCONTINUED] triamcinolone  (NASACORT ) 55 MCG/ACT AERO nasal inhaler Place 1 spray into the nose daily. [facility] brand   [DISCONTINUED] zonisamide (ZONEGRAN) 25 MG capsule Take 100 mg by mouth every evening.   No facility-administered encounter  medications on file as of 02/09/2024.    Past Medical History:  Diagnosis Date   ADD (attention deficit disorder)    ADHD (attention deficit hyperactivity disorder)    Anemia in my early 20's   Anxiety    Asthma    Chronic bronchitis (HCC)    Chronic insomnia    Cyclothymia    Cyclothymia    Depression    states off all meds   Excessive daytime sleepiness    GERD (gastroesophageal reflux disease)    H/O hiatal hernia    Hiatal hernia    History of anemia    History of gastric ulcer    IBS (irritable bowel syndrome)    Irritable bowel syndrome with diarrhea    Kidney stone    passed them 2009   MDD (major depressive disorder)    migraine headache    yearly (06/12/2015)   Migraines    Morbid obesity (HCC)    OSA on CPAP  07-29-2023  pt sated told moderate osa,  uses cpap nightly   PCOS (polycystic ovarian syndrome)    Retained intrauterine contraceptive device (IUD)    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   s/p  duodeno-lleal bypass w/ sleeve gastrectomy procedure  (SADIE procedure)   Sleep apnea    cpap autoset   Small bowel obstruction [facility])    Vocal cord dysfunction 2024   07-29-2023  pt stated been issue age 8 but only dx 2024 by ENT,  treated with speech therapy and had stopped medictaions that was causing to make it worse    Past Surgical History:  Procedure Laterality Date   CESAREAN SECTION  01/21/2007   CESAREAN SECTION  12/2007   CHOLECYSTECTOMY  12/12/2011   Procedure: LAPAROSCOPIC CHOLECYSTECTOMY WITH INTRAOPERATIVE CHOLANGIOGRAM;  Surgeon: Donnice POUR. Belinda, MD;  Location: [facility];  Service: General;  Laterality: N/A;   COLONOSCOPY  2019   CYSTOSCOPY WITH RETROGRADE PYELOGRAM, URETEROSCOPY AND STENT PLACEMENT Left 06/10/2022   Procedure: CYSTOSCOPY WITH RETROGRADE PYELOGRAM, URETEROSCOPY AND STENT PLACEMENT;  Surgeon: Cam Morene ORN, MD;  Location: [facility];  Service: Urology;  Laterality: Left;   CYSTOSCOPY/URETEROSCOPY/HOLMIUM LASER/STENT PLACEMENT Left  06/19/2022   Procedure: CYSTOSCOPY, LEFT URETEROSCOPY, RETROGRADE PYELOGRAM, BASKET STONE EXTRACTION; STENT REMOVAL;  Surgeon: Cam Morene ORN, MD;  Location: [facility];  Service: Urology;  Laterality: Left;   ESOPHAGOGASTRODUODENOSCOPY     GASTRECTOMY, SLEEVE, ROBOT-ASSISTED  11/2020   W/   ANASTOMOSIS  DUODENAL-IIEAL BYPASS   SADIE BARIATRIC PRODECURE   LAPAROSCOPIC CHOLECYSTECTOMY  11/2021   TYMPANOSTOMY TUBE PLACEMENT  ~ 1989   UPPER GASTROINTESTINAL ENDOSCOPY     WISDOM TOOTH EXTRACTION  01/21/1999   WISDOM TOOTH EXTRACTION  2002    Family History  Problem Relation Age of Onset   Breast cancer Mother        Breast   Obesity Mother    Thyroid  nodules Mother    Hypertension Father    Thyroid  nodules Sister    Obesity Sister    Obesity Daughter    Obesity Maternal Grandmother     Social History   Socioeconomic History   Marital status: Divorced    Spouse name: Not on file   Number of children: Not on file   Years of education: Not on file   Highest education level: Not on file  Occupational History   Not on file  Tobacco Use   Smoking status: Never   Smokeless tobacco: Never   Tobacco comments:    Smokes hookah few x month  Vaping Use   Vaping status: Never Used  Substance and Sexual Activity   Alcohol use: Yes    Comment: occasional   Drug use: Never   Sexual activity: Not Currently    Birth control/protection: I.U.D.  Other Topics Concern   Not on file  Social History Narrative   ** Merged History Encounter **       Social Drivers of Health   Tobacco Use: Low Risk (02/09/2024)   Patient History    Smoking Tobacco Use: Never    Smokeless Tobacco Use: Never    Passive Exposure: Not on file  Financial Resource Strain: Not on file  Food Insecurity: Not on file  Transportation Needs: Not on file  Physical Activity: Not on file  Stress: Not on file  Social Connections: Unknown (10/21/2021)   Received from [facility]   Social  Network    Social Network: Not on file  Intimate Partner Violence: Unknown (10/21/2021)  Received from [facility]   HITS    Physically Hurt: Not on file    Insult or Talk Down To: Not on file    Threaten Physical Harm: Not on file    Scream or Curse: Not on file  Depression (PHQ2-9): Medium Risk (02/09/2024)   Depression (PHQ2-9)    PHQ-2 Score: 7  Alcohol Screen: Not on file  Housing: Not on file  Utilities: Not on file  Health Literacy: Not on file    ROS See HPI, all else negative.     Objective    BP 91/60   Pulse 68   Temp 97.8 F (36.6 C)   Resp 16   Wt 277 lb 6.4 oz (125.8 kg)   SpO2 97%   BMI 47.62 kg/m   Physical Exam Constitutional:      Appearance: Normal appearance.  Eyes:     Extraocular Movements: Extraocular movements intact.  Pulmonary:     Effort: Pulmonary effort is normal.  Musculoskeletal:        General: Normal range of motion.     Cervical back: Normal range of motion.  Neurological:     Mental Status: She is alert and oriented to person, place, and time.  Psychiatric:        Behavior: Behavior normal.         Assessment & Plan:   Problem List Items Addressed This Visit       Cardiovascular and Mediastinum   Migraine without aura and without status migrainosus, not intractable   - States that Triptans work well, but only takes sparingly due to limited prescription quantity from the [facility]. - She instead relies on combination of tramadol  and baclofen  for primary management which she had been getting from her former primary care provider. Evidently the origins of this treatment plan was self-directed by patient.   - Referred to neurology for comprehensive migraine management. - Will prescribe additional naratriptan  for acute management.      Relevant Medications   naratriptan  (AMERGE) 2.5 MG tablet     Digestive   GERD (gastroesophageal reflux disease)   - Recent worsening due to delayed  gastric emptying from GLP-1 agonists. On omeprazole  40 mg twice daily. Unable to use H2 blockers due to vocal cord dysfunction. - Contact bariatric team to discuss potential adjustment of Wegovy. - Continue omeprazole  40 mg twice daily.        Other   Vocal cord dysfunction   - Chronic since age 60, recently discovered to have been provoked by H1 blockers, H2 blockers, Tylenol , and cetirizine. Any residual breakthrough events are managed with breathing techniques. - Continue avoidance of known triggers.      ADHD   - Stable, follows with [facility]. - Currently using methylphenidate as needed. - Previously on Strattera, which caused weight gain.  - Continue methylphenidate as needed for ADHD management.      Morbid obesity (HCC)   - History of sleeve gastrectomy in 2022. On Wegovy for weight management. Her peak weight was 390 lbs. - Continue follow-up with bariatric team for weight management and potential adjustment of Wegovy due to GI effects.      Relevant Medications   WEGOVY 1.7 MG/0.75ML SOAJ SQ injection   methylphenidate 27 MG PO TB24   Other Relevant Orders   CBC with Differential/Platelet   Comprehensive metabolic panel with GFR   Hemoglobin A1c   Lipid panel   Other Visit Diagnoses  Need for influenza vaccination    -  Primary   Relevant Orders   Flu vaccine trivalent PF, 6mos and older(Flulaval,Afluria,Fluarix,Fluzone) (Completed)     Hx of migraine headaches       Relevant Orders   Ambulatory referral to Neurology        Return in about 4 weeks (around 03/08/2024) for Annual Physical Appt.   Claudia Seip, PA-C   "

## 2024-02-09 NOTE — Assessment & Plan Note (Signed)
-   Stable, follows with [facility]. - Currently using methylphenidate as needed. - Previously on Strattera, which caused weight gain.  - Continue methylphenidate as needed for ADHD management.

## 2024-02-09 NOTE — Patient Instructions (Addendum)
" °  VISIT SUMMARY: Today, we discussed your ongoing health concerns and established a plan to manage them effectively. We reviewed your history of morbid obesity, GERD, vocal cord dysfunction, migraine disorder, and ADHD. We also discussed your significant weight loss journey and the medications you are currently taking.  YOUR PLAN: -GASTROESOPHAGEAL REFLUX DISEASE (GERD): GERD is a condition where stomach acid frequently flows back into the tube connecting your mouth and stomach, causing irritation. Your GERD has worsened due to delayed gastric emptying from your current medication, Wegovy. Please contact your bariatric team to discuss potential adjustments to [facility]. Continue taking omeprazole  40 mg twice daily to manage your symptoms.  -MORBID OBESITY, STATUS POST SLEEVE GASTRECTOMY: Morbid obesity is a condition of excessive body fat that can affect your health. You had a sleeve gastrectomy in 2022 and are currently on Wegovy for weight management. You have lost 120 pounds but gained 40 pounds after starting Strattera. Continue following up with your bariatric team for weight management and potential adjustments to [facility].  -MIGRAINE DISORDER: Migraine is a type of headache characterized by severe pain, often accompanied by nausea and sensitivity to light and sound. You are referred to neurology for comprehensive migraine management and prescribed additional naratriptan  for acute migraine management.  -VOCAL CORD DYSFUNCTION: Vocal cord dysfunction is a condition where the vocal cords do not open correctly, causing breathing difficulties. Your condition is exacerbated by certain medications. Continue avoiding known triggers such as H1 blockers, H2 blockers, Tylenol , sertraline, and cetirizine, and use breathing techniques to manage occasional attacks.  -ATTENTION-DEFICIT HYPERACTIVITY DISORDER (ADHD): ADHD is a condition characterized by inattention, hyperactivity, and impulsiveness. You previously  used Strattera, which caused weight gain, and now use methylphenidate as needed. Continue taking methylphenidate as needed for ADHD management.  INSTRUCTIONS: Please contact your bariatric team to discuss potential adjustments to [facility]. Continue taking omeprazole  40 mg twice daily for GERD. Follow up with your bariatric team for weight management. You are referred to neurology for comprehensive migraine management and prescribed additional naratriptan  for acute migraine management. Continue avoiding known triggers for vocal cord dysfunction and use breathing techniques as needed. Continue taking methylphenidate as needed for ADHD management.    Contains text generated by [employer].   "

## 2024-02-09 NOTE — Assessment & Plan Note (Signed)
-   Chronic since age 44, recently discovered to have been provoked by H1 blockers, H2 blockers, Tylenol , and cetirizine. Any residual breakthrough events are managed with breathing techniques. - Continue avoidance of known triggers.

## 2024-02-10 ENCOUNTER — Other Ambulatory Visit: Payer: Self-pay | Admitting: *Deleted

## 2024-02-10 ENCOUNTER — Ambulatory Visit: Payer: Self-pay | Admitting: Physician Assistant

## 2024-02-10 ENCOUNTER — Ambulatory Visit

## 2024-02-10 DIAGNOSIS — R748 Abnormal levels of other serum enzymes: Secondary | ICD-10-CM

## 2024-02-10 LAB — VITAMIN D 25 HYDROXY (VIT D DEFICIENCY, FRACTURES): VITD: 44.67 ng/mL (ref 30.00–100.00)

## 2024-02-10 LAB — GAMMA GT: GGT: 8 U/L (ref 7–51)

## 2024-02-10 NOTE — Progress Notes (Signed)
 Can we add on a GGT and calcium  given the elevated alkaline phosphatase?

## 2024-02-19 ENCOUNTER — Other Ambulatory Visit: Payer: Self-pay

## 2024-02-19 ENCOUNTER — Emergency Department (HOSPITAL_COMMUNITY)

## 2024-02-19 ENCOUNTER — Emergency Department (HOSPITAL_COMMUNITY)
Admission: EM | Admit: 2024-02-19 | Discharge: 2024-02-19 | Discharge status: Against Medical Advice | Source: Ambulatory Visit | Attending: Emergency Medicine | Admitting: Emergency Medicine

## 2024-02-19 ENCOUNTER — Encounter (HOSPITAL_COMMUNITY): Payer: Self-pay

## 2024-02-19 DIAGNOSIS — R519 Headache, unspecified: Secondary | ICD-10-CM | POA: Diagnosis present

## 2024-02-19 DIAGNOSIS — W000XXA Fall on same level due to ice and snow, initial encounter: Secondary | ICD-10-CM | POA: Insufficient documentation

## 2024-02-19 DIAGNOSIS — R112 Nausea with vomiting, unspecified: Secondary | ICD-10-CM | POA: Insufficient documentation

## 2024-02-19 DIAGNOSIS — R5383 Other fatigue: Secondary | ICD-10-CM | POA: Diagnosis not present

## 2024-02-19 DIAGNOSIS — Z5321 Procedure and treatment not carried out due to patient leaving prior to being seen by health care provider: Secondary | ICD-10-CM | POA: Insufficient documentation

## 2024-02-19 LAB — BASIC METABOLIC PANEL WITH GFR
Anion gap: 9 (ref 5–15)
BUN: 12 mg/dL (ref 6–20)
CO2: 25 mmol/L (ref 22–32)
Calcium: 8.9 mg/dL (ref 8.9–10.3)
Chloride: 108 mmol/L (ref 98–111)
Creatinine, Ser: 0.69 mg/dL (ref 0.44–1.00)
GFR, Estimated: >60 mL/min
Glucose, Bld: 91 mg/dL (ref 70–99)
Potassium: 4 mmol/L (ref 3.5–5.1)
Sodium: 141 mmol/L (ref 135–145)

## 2024-02-19 LAB — CBC
HCT: 39.8 % (ref 36.0–46.0)
Hemoglobin: 12.9 g/dL (ref 12.0–15.0)
MCH: 28.8 pg (ref 26.0–34.0)
MCHC: 32.4 g/dL (ref 30.0–36.0)
MCV: 88.8 fL (ref 80.0–100.0)
Platelets: 239 10*3/uL (ref 150–400)
RBC: 4.48 MIL/uL (ref 3.87–5.11)
RDW: 13.1 % (ref 11.5–15.5)
WBC: 7.1 10*3/uL (ref 4.0–10.5)
nRBC: 0 % (ref 0.0–0.2)

## 2024-02-19 LAB — MAGNESIUM: Magnesium: 2.7 mg/dL — ABNORMAL HIGH (ref 1.7–2.4)

## 2024-02-19 MED ORDER — IBUPROFEN 200 MG PO TABS
400.0000 mg | ORAL_TABLET | Freq: Once | ORAL | Status: AC | PRN
  Administered 2024-02-19: 400 mg via ORAL
  Filled 2024-02-19: qty 2

## 2024-02-19 NOTE — ED Provider Triage Note (Signed)
 Emergency Medicine Provider Triage Evaluation Note  Claudia Walsh , a 44 y.o. female  was evaluated in triage.  Pt complains of fall.  Patient had a fall on the ice yesterday where she slipped back and hit her head.  Denies loss of consciousness.  Patient denies any anticoagulation.  Patient states at this time, she has a headache, had an episode of nausea and vomiting this morning, and is feeling lightheaded.  Patient states that she is currently not having any visual changes however has some wax/wane photophobia.  Patient is in no acute distress.  Review of Systems  Positive:  Negative:  Physical Exam  BP 95/67 (BP Location: Left Wrist)   Pulse 65   Temp 98 F (36.7 C) (Oral)   Resp 20   Ht 5' 4 (1.626 m)   Wt 120.2 kg   SpO2 100%   BMI 45.49 kg/m  Gen:   Awake, no distress   Resp:  Normal effort  MSK:   Moves extremities without difficulty  Other:    Medical Decision Making  Medically screening exam initiated at 2:25 PM.  Appropriate orders placed.  Claudia Walsh was informed that the remainder of the evaluation will be completed by another provider, this initial triage assessment does not replace that evaluation, and the importance of remaining in the ED until their evaluation is complete.     Claudia Walsh, Claudia Walsh 02/19/24 1427

## 2024-02-19 NOTE — ED Triage Notes (Signed)
 Pt presents with injuries from a fall yesterday when she slipped on the ice. She hit the back of her head on the icy gravel driveway. Denies LOC. Since then pt has had a severe HA, memory problems, brain fog, N/V, and fatigue. She called her PCP and she was referred to the ED.

## 2024-02-19 NOTE — Telephone Encounter (Signed)
 Spoke with patient who stated that she was about to go into an urgent care given progressively worsening headache.  Given the mechanism of striking back of her head on ice in addition to the worsening headache, nausea, and spacing out, advised her to go to the ED for a evaluation and likely non contrast head CT to r/o intracranial bleed.

## 2024-03-09 ENCOUNTER — Encounter: Admitting: Physician Assistant
